# Patient Record
Sex: Male | Born: 1940 | Race: White | Hispanic: No | Marital: Married | Smoking: Former smoker
Health system: Southern US, Community
[De-identification: ages and names within clinical notes are randomized; demographics above are authoritative.]

## PROBLEM LIST (undated history)

## (undated) DIAGNOSIS — K4090 Unilateral inguinal hernia, without obstruction or gangrene, not specified as recurrent: Secondary | ICD-10-CM

## (undated) DIAGNOSIS — I10 Essential (primary) hypertension: Secondary | ICD-10-CM

## (undated) DIAGNOSIS — E039 Hypothyroidism, unspecified: Secondary | ICD-10-CM

## (undated) DIAGNOSIS — N529 Male erectile dysfunction, unspecified: Secondary | ICD-10-CM

## (undated) DIAGNOSIS — N400 Enlarged prostate without lower urinary tract symptoms: Secondary | ICD-10-CM

## (undated) DIAGNOSIS — E079 Disorder of thyroid, unspecified: Secondary | ICD-10-CM

## (undated) DIAGNOSIS — N189 Chronic kidney disease, unspecified: Secondary | ICD-10-CM

## (undated) HISTORY — PX: APPENDECTOMY: SHX54

## (undated) HISTORY — DX: Essential (primary) hypertension: I10

## (undated) HISTORY — DX: Unilateral inguinal hernia, without obstruction or gangrene, not specified as recurrent: K40.90

## (undated) HISTORY — DX: Male erectile dysfunction, unspecified: N52.9

## (undated) HISTORY — DX: Disorder of thyroid, unspecified: E07.9

## (undated) HISTORY — PX: HERNIA REPAIR: SHX51

## (undated) HISTORY — DX: Benign prostatic hyperplasia without lower urinary tract symptoms: N40.0

## (undated) HISTORY — DX: Chronic kidney disease, unspecified: N18.9

---

## 1998-12-19 ENCOUNTER — Ambulatory Visit (HOSPITAL_COMMUNITY): Admission: RE | Admit: 1998-12-19 | Discharge: 1998-12-19 | Payer: Self-pay | Admitting: Surgery

## 2000-11-09 ENCOUNTER — Encounter (INDEPENDENT_AMBULATORY_CARE_PROVIDER_SITE_OTHER): Payer: Self-pay | Admitting: *Deleted

## 2000-11-09 ENCOUNTER — Ambulatory Visit (HOSPITAL_COMMUNITY): Admission: RE | Admit: 2000-11-09 | Discharge: 2000-11-09 | Payer: Self-pay | Admitting: Gastroenterology

## 2004-01-15 ENCOUNTER — Ambulatory Visit (HOSPITAL_COMMUNITY): Admission: RE | Admit: 2004-01-15 | Discharge: 2004-01-15 | Payer: Self-pay | Admitting: Gastroenterology

## 2005-12-17 ENCOUNTER — Ambulatory Visit: Payer: Self-pay | Admitting: Cardiology

## 2006-05-27 ENCOUNTER — Emergency Department (HOSPITAL_COMMUNITY): Admission: EM | Admit: 2006-05-27 | Discharge: 2006-05-27 | Payer: Self-pay | Admitting: Emergency Medicine

## 2008-09-10 ENCOUNTER — Ambulatory Visit (HOSPITAL_COMMUNITY): Admission: RE | Admit: 2008-09-10 | Discharge: 2008-09-10 | Payer: Self-pay | Admitting: Family Medicine

## 2010-01-30 ENCOUNTER — Encounter: Payer: Self-pay | Admitting: Cardiology

## 2010-01-30 ENCOUNTER — Telehealth: Payer: Self-pay | Admitting: Cardiology

## 2010-02-03 DIAGNOSIS — I10 Essential (primary) hypertension: Secondary | ICD-10-CM | POA: Insufficient documentation

## 2010-02-09 ENCOUNTER — Encounter (INDEPENDENT_AMBULATORY_CARE_PROVIDER_SITE_OTHER): Payer: Self-pay | Admitting: *Deleted

## 2010-05-12 ENCOUNTER — Ambulatory Visit: Payer: Self-pay | Admitting: Cardiology

## 2010-05-12 ENCOUNTER — Ambulatory Visit: Payer: Self-pay

## 2010-11-24 NOTE — Progress Notes (Signed)
Summary: NEEDS GXT  Phone Note From Other Clinic   Caller: Provider Call For: pt Details for Reason: Per call from Dr Christell Constant stress test Summary of Call: recieved a phone call from Dr Rudi Heap, he is requesting a stress test.  States pt had one 2007 here.  Dr Christell Constant would like for Dr Antoine Poche to decide what kind of stress test the pt needs.  Pt has a strong family history of CAD, HTN and high cholestrol.  Have requested the paper chart and will forward to Dr Antoine Poche for review and orders.  cell phone number [phone]  JAKE LAST STRESS TEST WAS A POET.  PT HAS NO NEW S/S PER DR MOORE. HE JUST WANTS IT FOR SCREENING.  CHART ON YOUR CART Initial call taken by: Charolotte Capuchin, RN,  January 30, 2010 10:04 AM  Follow-up for Phone Call        OK to schedule POET (Plain Old Exercise Treadmill) Follow-up by: Rollene Rotunda, MD, [facility],  February 03, 2010 5:20 PM  New Problems: HYPERTENSION, CONTROLLED (ICD-401.1) CORONARY ARTERY DISEASE, FAMILY HX (ICD-V17.3)   New Problems: HYPERTENSION, CONTROLLED (ICD-401.1) CORONARY ARTERY DISEASE, FAMILY HX (ICD-V17.3)

## 2010-11-24 NOTE — Letter (Signed)
Summary: Generic Letter  Architectural technologist, Main Office  [address]   [place], [place] [address]   Phone: [phone]  Fax: [phone]        February 09, 2010 MRN: [id number]    [name] [address] [place], [place]  [address]    Dear Mr. Larmon,   Dear Mr. Wann,  Our records indicate it is time to schedule a treadmill test with Dr. Antoine Poche .  Please call our office to schedule your appointment.    If you have any questions regarding this, call the office at the number listed above, and ask for the nurse.          Sincerely,  Merita Norton Lloyd-Fate  This letter has been electronically signed by your physician.

## 2011-01-01 ENCOUNTER — Encounter: Payer: Self-pay | Admitting: Cardiology

## 2011-03-12 NOTE — Procedures (Signed)
[facility]  Patient:    [name], [name]                        MRN: [id number] Proc. Date: 11/09/00 Adm. Date:  [id number] Attending:  Nelda Marseille CC:         Monica Becton, M.D.                           Procedure Report  PROCEDURE:  Colonoscopy with hot biopsy.  INDICATIONS:  Patient due for colonic screening with a mild change in bowel habits, some increased diarrhea, and one grandfather with colon cancer.  INFORMED CONSENT:  Consent was signed after risk, benefits, methods and options were thoroughly discussed in the office.  MEDICINES USED:  Demerol 60 mg, Versed 6 mg.  DESCRIPTION OF PROCEDURE:  Rectal inspection was pertinent for external hemorrhoids.  Digital exam was negative.  The video colonoscope was inserted and very easily advanced around the colon to the cecum.  No obvious abnormality, but a small rectal polyp was seen on insertion.  The cecum was identified by the appendiceal orifice and the ileocecal valve.  In fact, the scope was inserted a short ways in the terminal ileum which was normal. Photodocumentation was obtained.  The scope was slowly withdrawn.  Prep was good.  There was minimal liquid stool that required suctioning only.  On slow withdrawal through the colon, the cecum and the ascending was normal.  At the approximate level of the hepatic flexure, a 2-3 mm polyp was seen and was hot biopsied x 1.  The scope was further withdrawn.  The transverse was normal. In the proximal level of the splenic flexure, however, another 3-4 mm polyp was seen and was hot biopsied x 2.  The scope was further withdrawn back to the rectum.  No additional polyps were seen in the descending or sigmoid.  In the rectum, the polyp seen on insertion was snared, electrocautery applied, and the polyp was suctioned through the scope and collected in the trap.  It was about 4 mm in size.  There seemed to be possibly some residual polyp  which was hot biopsied x 1.  The scope was retroflexed pertinent for some internal hemorrhoids.  The scope was straightened, air was withdrawn, and the scope removed.  The patient tolerated the procedure well, and there was no obvious immediate complication.  ENDOSCOPIC DIAGNOSES: 1. Internal and external hemorrhoids. 2. Three small polyps, one in the rectum, snared and hot biopsied; two in the    splenic and hepatic flexure, both hot biopsied. 3. Otherwise within normal limits to the terminal ileum.  PLAN:  GI follow-up p.r.n.  Yearly rectals and guaiacs per Dr. Christell Constant.  One week customary postpolypectomy instructions and await pathology to determine future colonic screening. DD:  11/09/00 TD:  11/10/00 Job: [id number]

## 2011-03-12 NOTE — Op Note (Signed)
[name], [name]                           ACCOUNT NO.:  [id number]   MEDICAL RECORD NO.:  [id number]                   PATIENT TYPE:  AMB   LOCATION:  ENDO                                 FACILITY:  [facility]   PHYSICIAN:  Petra Kuba, M.D.                 DATE OF BIRTH:  23-Nov-1940   DATE OF PROCEDURE:  01/15/2004  DATE OF DISCHARGE:                                 OPERATIVE REPORT   PROCEDURE:  Colonoscopy.   INDICATIONS FOR PROCEDURE:  A patient with a history of colon polyps, family  history of colon cancer.   Consent was signed after risks, benefits, methods, and options were  thoroughly discussed in the office.   MEDICINES USED:  Demerol 90, Versed 9.   DESCRIPTION OF PROCEDURE:  Rectal inspection was pertinent for small  external hemorrhoids. Digital exam was negative. The video colonoscope was  inserted, easily advanced around the colon to the cecum. This did not  require any abdominal pressure or any position changes. No abnormality was  seen on insertion. The cecum was identified by the appendiceal orifice and  the ileocecal valve. The scope was slowly withdrawn.  No abnormalities were  seen as the scope was slowly withdrawn back to the rectum. Specifically no  polyps, tumors, masses, diverticula or other abnormalities.  The prep was  adequate. There was some liquid stool that required washing and suctioning.  Anal rectal pullthrough and retroflexion confirmed small hemorrhoids.  The  scope was reinserted a short ways up the left side of the colon, air was  suctioned, scope removed.  The patient tolerated the procedure well.  There  was no obvious or immediate complications.   ENDOSCOPIC DIAGNOSIS:  1. Internal and external hemorrhoids.  2. Otherwise within normal limits to the cecum.   PLAN:  Yearly rectals and guaiacs per Dr. Christell Constant.  Happy to see back p.r.n.  otherwise repeat colonoscopy in five years.                                               Petra Kuba, M.D.    MEM/MEDQ  D:  01/15/2004  T:  01/16/2004  Job:  [id number]   cc:   Ernestina Penna, M.D.  [address] [place]  [place] [address]  Fax: [phone]

## 2012-10-02 ENCOUNTER — Encounter: Payer: Self-pay | Admitting: Cardiology

## 2012-10-03 LAB — HEPATIC FUNCTION PANEL
ALT: 26 U/L (ref 10–40)
AST: 24 U/L (ref 14–40)
Alkaline Phosphatase: 61 U/L (ref 25–125)
Bilirubin, Direct: 0.1 mg/dL (ref 0.01–0.4)
Bilirubin, Total: 0.6 mg/dL

## 2012-10-03 LAB — BASIC METABOLIC PANEL WITH GFR
BUN: 14 mg/dL (ref 4–21)
Creatinine: 1.3 mg/dL (ref 0.6–1.3)
Glucose: 102 mg/dL
Potassium: 4 mmol/L (ref 3.4–5.3)
Sodium: 141 mmol/L (ref 137–147)

## 2012-10-03 LAB — LIPID PANEL: HDL: 43 mg/dL (ref 35–70)

## 2012-10-03 LAB — CBC AND DIFFERENTIAL
HCT: 46 % (ref 41–53)
Hemoglobin: 15 g/dL (ref 13.5–17.5)
Platelets: 267 K/µL (ref 150–399)
WBC: 6.7 10*3/mL

## 2013-01-31 ENCOUNTER — Encounter: Payer: Self-pay | Admitting: Family Medicine

## 2013-01-31 ENCOUNTER — Ambulatory Visit (INDEPENDENT_AMBULATORY_CARE_PROVIDER_SITE_OTHER): Payer: Medicare Other | Admitting: Family Medicine

## 2013-01-31 ENCOUNTER — Encounter: Payer: Self-pay | Admitting: *Deleted

## 2013-01-31 ENCOUNTER — Ambulatory Visit (INDEPENDENT_AMBULATORY_CARE_PROVIDER_SITE_OTHER): Payer: Medicare Other

## 2013-01-31 VITALS — BP 147/84 | HR 66 | Temp 97.4°F | Ht 71.25 in | Wt 261.8 lb

## 2013-01-31 DIAGNOSIS — N183 Chronic kidney disease, stage 3 unspecified: Secondary | ICD-10-CM | POA: Insufficient documentation

## 2013-01-31 DIAGNOSIS — E785 Hyperlipidemia, unspecified: Secondary | ICD-10-CM | POA: Insufficient documentation

## 2013-01-31 DIAGNOSIS — N400 Enlarged prostate without lower urinary tract symptoms: Secondary | ICD-10-CM | POA: Insufficient documentation

## 2013-01-31 DIAGNOSIS — E559 Vitamin D deficiency, unspecified: Secondary | ICD-10-CM

## 2013-01-31 DIAGNOSIS — N189 Chronic kidney disease, unspecified: Secondary | ICD-10-CM

## 2013-01-31 DIAGNOSIS — I10 Essential (primary) hypertension: Secondary | ICD-10-CM

## 2013-01-31 DIAGNOSIS — E039 Hypothyroidism, unspecified: Secondary | ICD-10-CM | POA: Insufficient documentation

## 2013-01-31 LAB — BASIC METABOLIC PANEL WITH GFR
CO2: 28 mEq/L (ref 19–32)
GFR, Est African American: 60 mL/min
GFR, Est Non African American: 52 mL/min — ABNORMAL LOW
Glucose, Bld: 108 mg/dL — ABNORMAL HIGH (ref 70–99)
Potassium: 3.8 mEq/L (ref 3.5–5.3)
Sodium: 140 mEq/L (ref 135–145)

## 2013-01-31 LAB — HEPATIC FUNCTION PANEL
ALT: 34 U/L (ref 0–53)
AST: 35 U/L (ref 0–37)
Alkaline Phosphatase: 61 U/L (ref 39–117)
Bilirubin, Direct: 0.2 mg/dL (ref 0.0–0.3)

## 2013-01-31 LAB — POCT CBC
Granulocyte percent: 52.4 %G (ref 37–80)
HCT, POC: 44.1 % (ref 43.5–53.7)
MCH, POC: 30.5 pg (ref 27–31.2)
MCHC: 34.3 g/dL (ref 31.8–35.4)
MCV: 88.7 fL (ref 80–97)
MPV: 6.6 fL (ref 0–99.8)
POC LYMPH PERCENT: 38.7 %L (ref 10–50)
Platelet Count, POC: 280 10*3/uL (ref 142–424)

## 2013-01-31 NOTE — Progress Notes (Addendum)
  Subjective:    [name], male    DOB: Jun 27, 1941, 72 y.o.   MRN: [id number]  HPI This patient presents for recheck of multiple medical problems. No one accompanies the patient today.  Patient Active Problem List  Diagnosis  . HYPERTENSION, CONTROLLED  . Hyperlipidemia  . Hypothyroid  . BPH (benign prostatic hyperplasia)  . CKD (chronic kidney disease)      The allergies, current medications, past medical history, surgical history, family and social history are reviewed.  Immunizations reviewed.  Health maintenance reviewed.  The following items are outstanding: none  Home bps 130's/upper 70's    Review of Systems  Constitutional: Negative.   HENT: Positive for postnasal drip (due to allergies).   Eyes: Negative.   Respiratory: Negative.   Cardiovascular: Negative.   Gastrointestinal: Positive for constipation (occasional).  Genitourinary: Positive for frequency (3-4 times qhs, enlarged prostate).  Musculoskeletal: Negative.   Allergic/Immunologic: Positive for environmental allergies.  Neurological: Negative.   Psychiatric/Behavioral: Positive for sleep disturbance (nightly, due to urinary frequency). The patient is nervous/anxious (family stressors).        Objective:   Physical Exam  BP 147/84  Pulse 66  Temp(Src) 97.4 F (36.3 C) (Oral)  Ht 5' 11.25" (1.81 m)  Wt 261 lb 12.8 oz (118.752 kg)  BMI 36.25 kg/m2  The patient appeared well nourished and normally developed, alert and oriented to time and place. Speech, behavior and judgement appear normal. Vital signs as documented.  Head exam is unremarkable. No scleral icterus or pallor noted.  Neck is without jugular venous distension, thyromegally, or carotid bruits. Carotid upstrokes are brisk bilaterally. No cervical adenopathy. Lungs are clear anteriorly and posteriorly to auscultation. Normal respiratory effort. Cardiac exam reveals regular rate and rhythm. First and second heart sounds  normal. No murmurs, rubs or gallops.  Abdominal exam reveals normal bowl sounds, no masses, no organomegaly and no aortic enlargement. No inguinal adenopathy. Extremities are nonedematous and both femoral and pedal pulses are normal. Skin without pallor or jaundice.  Warm and dry, without rash. Neurologic exam reveals normal deep tendon reflexes and normal sensation.  [facility] reading (PRIMARY) by  Dr. Vernon Prey: Chest x-ray appears to be normal.                                           Assessment & Plan:   1. Essential hypertension, benign - BASIC METABOLIC PANEL WITH GFR; Standing  2. Other and unspecified hyperlipidemia - Hepatic function panel; Standing - NMR Lipoprofile with Lipids; Standing  3. Unspecified hypothyroidism  4. Unspecified vitamin D deficiency - POCT CBC; Standing - Vitamin D 25 hydroxy; Standing  5. CKD (chronic kidney disease) - POCT CBC; Standing  6. BPH (benign prostatic hypertrophy) - POCT CBC; Standing

## 2013-01-31 NOTE — Patient Instructions (Addendum)
FOBT given Continue current meds and therapeutic lifestyle changes

## 2013-01-31 NOTE — Addendum Note (Signed)
Addended by: Prescott Gum on: 01/31/2013 10:46 AM   Modules accepted: Orders

## 2013-02-01 LAB — NMR LIPOPROFILE WITH LIPIDS
Cholesterol, Total: 98 mg/dL (ref <200)
HDL Size: 9 nm — ABNORMAL LOW (ref >=9.2)
LDL (calc): 44 mg/dL (ref <100)
LDL Particle Number: 631 nmol/L (ref <1000)
LDL Size: 20.1 nm — ABNORMAL LOW (ref >20.5)
LP-IR Score: 50 — ABNORMAL HIGH (ref <=45)
Large HDL-P: 2.6 umol/L — ABNORMAL LOW (ref >=4.8)
Small LDL Particle Number: 356 nmol/L (ref <=527)
Triglycerides: 83 mg/dL (ref <150)

## 2013-03-05 ENCOUNTER — Other Ambulatory Visit (INDEPENDENT_AMBULATORY_CARE_PROVIDER_SITE_OTHER): Payer: Medicare Other

## 2013-03-05 DIAGNOSIS — Z1212 Encounter for screening for malignant neoplasm of rectum: Secondary | ICD-10-CM

## 2013-03-05 NOTE — Progress Notes (Signed)
Patient dropped off fobt 

## 2013-03-06 LAB — FECAL OCCULT BLOOD, IMMUNOCHEMICAL: Fecal Occult Blood: NEGATIVE

## 2013-03-09 ENCOUNTER — Telehealth: Payer: Self-pay | Admitting: Family Medicine

## 2013-03-10 NOTE — Telephone Encounter (Signed)
LMOM with normal result

## 2013-05-11 ENCOUNTER — Telehealth: Payer: Self-pay | Admitting: Family Medicine

## 2013-05-21 ENCOUNTER — Encounter: Payer: Self-pay | Admitting: Family Medicine

## 2013-08-06 ENCOUNTER — Encounter: Payer: Self-pay | Admitting: Family Medicine

## 2013-08-06 ENCOUNTER — Ambulatory Visit (INDEPENDENT_AMBULATORY_CARE_PROVIDER_SITE_OTHER): Payer: Medicare Other | Admitting: Family Medicine

## 2013-08-06 VITALS — BP 141/85 | HR 65 | Temp 97.3°F | Ht 71.25 in | Wt 265.0 lb

## 2013-08-06 DIAGNOSIS — L918 Other hypertrophic disorders of the skin: Secondary | ICD-10-CM

## 2013-08-06 DIAGNOSIS — N400 Enlarged prostate without lower urinary tract symptoms: Secondary | ICD-10-CM

## 2013-08-06 DIAGNOSIS — E039 Hypothyroidism, unspecified: Secondary | ICD-10-CM

## 2013-08-06 DIAGNOSIS — K098 Other cysts of oral region, not elsewhere classified: Secondary | ICD-10-CM

## 2013-08-06 DIAGNOSIS — K099 Cyst of oral region, unspecified: Secondary | ICD-10-CM

## 2013-08-06 DIAGNOSIS — L909 Atrophic disorder of skin, unspecified: Secondary | ICD-10-CM

## 2013-08-06 DIAGNOSIS — E559 Vitamin D deficiency, unspecified: Secondary | ICD-10-CM

## 2013-08-06 DIAGNOSIS — E785 Hyperlipidemia, unspecified: Secondary | ICD-10-CM

## 2013-08-06 DIAGNOSIS — Z23 Encounter for immunization: Secondary | ICD-10-CM

## 2013-08-06 DIAGNOSIS — I10 Essential (primary) hypertension: Secondary | ICD-10-CM

## 2013-08-06 LAB — POCT CBC
Granulocyte percent: 67.9 %G (ref 37–80)
Hemoglobin: 15 g/dL (ref 14.1–18.1)
MCH, POC: 29.9 pg (ref 27–31.2)
MCV: 88.6 fL (ref 80–97)
MPV: 6.6 fL (ref 0–99.8)
Platelet Count, POC: 297 10*3/uL (ref 142–424)
RBC: 5 M/uL (ref 4.69–6.13)

## 2013-08-06 MED ORDER — AMLODIPINE BESYLATE 10 MG PO TABS: 10.0000 mg | ORAL_TABLET | Freq: Every day | ORAL | Status: DC

## 2013-08-06 MED ORDER — LOVASTATIN 40 MG PO TABS: 40.0000 mg | ORAL_TABLET | Freq: Every day | ORAL | Status: DC

## 2013-08-06 MED ORDER — LEVOTHYROXINE SODIUM 137 MCG PO TABS: 137.0000 ug | ORAL_TABLET | Freq: Every day | ORAL | Status: DC

## 2013-08-06 MED ORDER — LOSARTAN POTASSIUM 100 MG PO TABS: 100.0000 mg | ORAL_TABLET | Freq: Every day | ORAL | Status: DC

## 2013-08-06 NOTE — Addendum Note (Signed)
Addended by: Ardine Eng A on: 08/06/2013 09:55 AM   Modules accepted: Orders

## 2013-08-06 NOTE — Progress Notes (Signed)
Subjective:    [name], male    DOB: 08/31/1941, 72 y.o.   MRN: [id number]  HPI Pt here for follow up and management of chronic medical problems. See review of systems.On health maintenance parameters, patient will need a Prevnar today and flu shot.     Patient Active Problem List   Diagnosis Date Noted  . Hyperlipidemia 01/31/2013  . Hypothyroid 01/31/2013  . BPH (benign prostatic hyperplasia) 01/31/2013  . CKD (chronic kidney disease) 01/31/2013  . HYPERTENSION, CONTROLLED 02/03/2010   Outpatient Encounter Prescriptions as of 08/06/2013  Medication Sig Dispense Refill  . amLODipine (NORVASC) 10 MG tablet Take 10 mg by mouth daily.      Marland Kitchen aspirin 81 MG chewable tablet Chew 81 mg by mouth daily.      . Cholecalciferol (HM VITAMIN D3) 4000 UNITS CAPS Take 2,000 Units by mouth daily.       Marland Kitchen levothyroxine (SYNTHROID, LEVOTHROID) 137 MCG tablet Take 137 mcg by mouth daily before breakfast.      . losartan (COZAAR) 100 MG tablet Take 100 mg by mouth daily.      Marland Kitchen lovastatin (MEVACOR) 40 MG tablet Take 40 mg by mouth at bedtime.      . Omega-3 Fatty Acids (FISH OIL) 1200 MG CAPS Take 1,200 mg by mouth daily.       No facility-administered encounter medications on file as of 08/06/2013.    Review of Systems  Constitutional: Negative.   HENT: Positive for dental problem (dentist states- wart inside mouth).   Eyes: Negative.   Respiratory: Negative.   Cardiovascular: Negative.   Gastrointestinal: Negative.   Endocrine: Negative.   Genitourinary: Negative.   Musculoskeletal: Negative.   Skin: Negative.        Skin tags  Allergic/Immunologic: Negative.   Neurological: Negative.   Hematological: Negative.   Psychiatric/Behavioral: Negative.        Objective:   Physical Exam  Nursing note and vitals reviewed. Constitutional: He is oriented to person, place, and time. He appears well-developed and well-nourished.  Pleasant and alert  HENT:  Head:  Normocephalic and atraumatic.  Right Ear: External ear normal.  Left Ear: External ear normal.  Nose: Nose normal.  Mouth/Throat: Oropharynx is clear and moist. No oropharyngeal exudate.  There was a tiny nodular area that appeared to be like a mucous cyst noted on a pinhead in his right buccal mucosa  Eyes: Conjunctivae and EOM are normal. Right eye exhibits no discharge. Left eye exhibits no discharge. No scleral icterus.  Neck: Normal range of motion. Neck supple. No tracheal deviation present. No thyromegaly present.  There were no bruits in the neck  Cardiovascular: Normal rate, regular rhythm, normal heart sounds and intact distal pulses.  Exam reveals no gallop and no friction rub.   No murmur heard. At 72 per minute  Pulmonary/Chest: Effort normal and breath sounds normal. No respiratory distress. He has no wheezes. He has no rales. He exhibits no tenderness.  Abdominal: Soft. Bowel sounds are normal. He exhibits no mass. There is no tenderness. There is no rebound and no guarding.  Musculoskeletal: Normal range of motion. He exhibits no edema and no tenderness.  Lymphadenopathy:    He has no cervical adenopathy.  Neurological: He is alert and oriented to person, place, and time. He has normal reflexes. No cranial nerve deficit.  Skin: Skin is warm and dry. No rash noted. No erythema. No pallor.  Skin tags of chest near axilla, non-irritated  Psychiatric:  He has a normal mood and affect. His behavior is normal. Judgment and thought content normal.   BP 141/85  Pulse 65  Temp(Src) 97.3 F (36.3 C) (Oral)  Ht 5' 11.25" (1.81 m)  Wt 265 lb (120.203 kg)  BMI 36.69 kg/m2        Assessment & Plan:   1. HYPERTENSION, CONTROLLED   2. Hyperlipidemia   3. Hypothyroid   4. BPH (benign prostatic hyperplasia)   5. Vitamin D deficiency   6. Mucous cyst of mouth   7. Cutaneous skin tags    Orders Placed This Encounter  Procedures  . Hepatic function panel  . BMP8+EGFR  . NMR,  lipoprofile  . Vit D  25 hydroxy (rtn osteoporosis monitoring)  . Thyroid Panel With TSH  . POCT CBC   All meds will be refilled for mail order today  Patient Instructions  Continue current medications. Continue good therapeutic lifestyle changes.  Fall precautions discussed with patient. Follow up as planned and earlier as needed.  You receive your flu shot and a one time Prevnar vaccination today Monitor  mucous cyst in mouth and if it gets larger or more irritated arrange a visit with an oral surgeon    Nyra Capes MD

## 2013-08-06 NOTE — Progress Notes (Signed)
Flu shot and Prevnar given . Pt tolerated well

## 2013-08-06 NOTE — Addendum Note (Signed)
Addended by: Magdalene River on: 08/06/2013 09:52 AM   Modules accepted: Orders

## 2013-08-06 NOTE — Patient Instructions (Addendum)
Continue current medications. Continue good therapeutic lifestyle changes.  Fall precautions discussed with patient. Follow up as planned and earlier as needed.  You receive your flu shot and a one time Prevnar vaccination today Monitor  mucous cyst in mouth and if it gets larger or more irritated arrange a visit with an oral surgeon Pneumococcal Vaccine, Polyvalent suspension for injection What is this medicine? PNEUMOCOCCAL VACCINE, POLYVALENT (NEU mo KOK al vak SEEN, pol ee VEY luhnt) is a vaccine to prevent pneumococcus bacteria infection. These bacteria are a major cause of ear infections, 'Strep throat' infections, and serious pneumonia, meningitis, or blood infections worldwide. These vaccines help the body to produce antibodies (protective substances) that help your body defend against these bacteria. This vaccine is recommended for infants and young children. This vaccine will not treat an infection. This medicine may be used for other purposes; ask your health care provider or pharmacist if you have questions. What should I tell my health care provider before I take this medicine? They need to know if you have any of these conditions: -bleeding problems -fever -immune system problems -low platelet count in the blood -seizures -an unusual or allergic reaction to pneumococcal vaccine, diphtheria toxoid, other vaccines, latex, other medicines, foods, dyes, or preservatives -pregnant or trying to get pregnant -breast-feeding How should I use this medicine? This vaccine is for injection into a muscle. It is given by a health care professional. A copy of Vaccine Information Statements will be given before each vaccination. Read this sheet carefully each time. The sheet may change frequently. Talk to your pediatrician regarding the use of this medicine in children. While this drug may be prescribed for children as young as 42 weeks old for selected conditions, precautions do  apply. Overdosage: If you think you have taken too much of this medicine contact a poison control center or emergency room at once. NOTE: This medicine is only for you. Do not share this medicine with others. What if I miss a dose? It is important not to miss your dose. Call your doctor or health care professional if you are unable to keep an appointment. What may interact with this medicine? -medicines for cancer chemotherapy -medicines that suppress your immune function -medicines that treat or prevent blood clots like warfarin, enoxaparin, and dalteparin -steroid medicines like prednisone or cortisone This list may not describe all possible interactions. Give your health care provider a list of all the medicines, herbs, non-prescription drugs, or dietary supplements you use. Also tell them if you smoke, drink alcohol, or use illegal drugs. Some items may interact with your medicine. What should I watch for while using this medicine? Mild fever and pain should go away in 3 days or less. Report any unusual symptoms to your doctor or health care professional. What side effects may I notice from receiving this medicine? Side effects that you should report to your doctor or health care professional as soon as possible: -allergic reactions like skin rash, itching or hives, swelling of the face, lips, or tongue -breathing problems -confused -fever over 102 degrees F -pain, tingling, numbness in the hands or feet -seizures -unusual bleeding or bruising -unusual muscle weakness Side effects that usually do not require medical attention (report to your doctor or health care professional if they continue or are bothersome): -aches and pains -diarrhea -fever of 102 degrees F or less -headache -irritable -loss of appetite -pain, tender at site where injected -trouble sleeping This list may not describe all possible side effects.  Call your doctor for medical advice about side effects. You may  report side effects to FDA at 1-800-FDA-1088. Where should I keep my medicine? This does not apply. This vaccine is given in a clinic, pharmacy, doctor's office, or other health care setting and will not be stored at home. NOTE: This sheet is a summary. It may not cover all possible information. If you have questions about this medicine, talk to your doctor, pharmacist, or health care provider.  2013, Elsevier/Gold Standard. (12/24/2008 10:17:22 AM) Influenza Virus Vaccine injection (Fluarix) What is this medicine? INFLUENZA VIRUS VACCINE (in floo EN zuh VAHY ruhs vak SEEN) helps to reduce the risk of getting influenza also known as the flu. This medicine may be used for other purposes; ask your health care provider or pharmacist if you have questions. What should I tell my health care provider before I take this medicine? They need to know if you have any of these conditions: -bleeding disorder like hemophilia -fever or infection -Guillain-Barre syndrome or other neurological problems -immune system problems -infection with the human immunodeficiency virus (HIV) or AIDS -low blood platelet counts -multiple sclerosis -an unusual or allergic reaction to influenza virus vaccine, eggs, chicken proteins, latex, gentamicin, other medicines, foods, dyes or preservatives -pregnant or trying to get pregnant -breast-feeding How should I use this medicine? This vaccine is for injection into a muscle. It is given by a health care professional. A copy of Vaccine Information Statements will be given before each vaccination. Read this sheet carefully each time. The sheet may change frequently. Talk to your pediatrician regarding the use of this medicine in children. Special care may be needed. Overdosage: If you think you have taken too much of this medicine contact a poison control center or emergency room at once. NOTE: This medicine is only for you. Do not share this medicine with others. What if I  miss a dose? This does not apply. What may interact with this medicine? -chemotherapy or radiation therapy -medicines that lower your immune system like etanercept, anakinra, infliximab, and adalimumab -medicines that treat or prevent blood clots like warfarin -phenytoin -steroid medicines like prednisone or cortisone -theophylline -vaccines This list may not describe all possible interactions. Give your health care provider a list of all the medicines, herbs, non-prescription drugs, or dietary supplements you use. Also tell them if you smoke, drink alcohol, or use illegal drugs. Some items may interact with your medicine. What should I watch for while using this medicine? Report any side effects that do not go away within 3 days to your doctor or health care professional. Call your health care provider if any unusual symptoms occur within 6 weeks of receiving this vaccine. You may still catch the flu, but the illness is not usually as bad. You cannot get the flu from the vaccine. The vaccine will not protect against colds or other illnesses that may cause fever. The vaccine is needed every year. What side effects may I notice from receiving this medicine? Side effects that you should report to your doctor or health care professional as soon as possible: -allergic reactions like skin rash, itching or hives, swelling of the face, lips, or tongue Side effects that usually do not require medical attention (report to your doctor or health care professional if they continue or are bothersome): -fever -headache -muscle aches and pains -pain, tenderness, redness, or swelling at site where injected -weak or tired This list may not describe all possible side effects. Call your doctor for medical  advice about side effects. You may report side effects to FDA at 1-800-FDA-1088. Where should I keep my medicine? This vaccine is only given in a clinic, pharmacy, doctor's office, or other health care  setting and will not be stored at home. NOTE: This sheet is a summary. It may not cover all possible information. If you have questions about this medicine, talk to your doctor, pharmacist, or health care provider.  2013, Elsevier/Gold Standard. (05/08/2008 9:30:40 AM)

## 2013-08-08 ENCOUNTER — Other Ambulatory Visit: Payer: Self-pay | Admitting: *Deleted

## 2013-08-08 LAB — THYROID PANEL WITH TSH
T3 Uptake Ratio: 29 % (ref 24–39)
T4, Total: 9.1 ug/dL (ref 4.5–12.0)
TSH: 6.22 u[IU]/mL — ABNORMAL HIGH (ref 0.450–4.500)

## 2013-08-08 LAB — HEPATIC FUNCTION PANEL
ALT: 34 IU/L (ref 0–44)
AST: 27 IU/L (ref 0–40)
Alkaline Phosphatase: 77 IU/L (ref 39–117)
Total Bilirubin: 0.4 mg/dL (ref 0.0–1.2)

## 2013-08-08 LAB — NMR, LIPOPROFILE
HDL Particle Number: 28.1 umol/L — ABNORMAL LOW (ref >=30.5)
LDL Particle Number: 756 nmol/L (ref <1000)
LDLC SERPL CALC-MCNC: 47 mg/dL (ref <100)
LP-IR Score: 60 — ABNORMAL HIGH (ref <=45)
Triglycerides by NMR: 111 mg/dL (ref <150)

## 2013-08-08 LAB — BMP8+EGFR
BUN/Creatinine Ratio: 14 (ref 10–22)
Creatinine, Ser: 1.37 mg/dL — ABNORMAL HIGH (ref 0.76–1.27)

## 2013-08-08 MED ORDER — LEVOTHYROXINE SODIUM 25 MCG PO TABS: 25.0000 ug | ORAL_TABLET | Freq: Every day | ORAL | Status: DC

## 2014-02-04 ENCOUNTER — Encounter: Payer: Self-pay | Admitting: Family Medicine

## 2014-02-04 ENCOUNTER — Ambulatory Visit (INDEPENDENT_AMBULATORY_CARE_PROVIDER_SITE_OTHER): Payer: Medicare Other | Admitting: Family Medicine

## 2014-02-04 VITALS — BP 141/82 | HR 64 | Temp 96.5°F | Ht 71.25 in | Wt 267.0 lb

## 2014-02-04 DIAGNOSIS — N400 Enlarged prostate without lower urinary tract symptoms: Secondary | ICD-10-CM

## 2014-02-04 DIAGNOSIS — E785 Hyperlipidemia, unspecified: Secondary | ICD-10-CM

## 2014-02-04 DIAGNOSIS — E039 Hypothyroidism, unspecified: Secondary | ICD-10-CM

## 2014-02-04 DIAGNOSIS — E8881 Metabolic syndrome: Secondary | ICD-10-CM | POA: Insufficient documentation

## 2014-02-04 DIAGNOSIS — N189 Chronic kidney disease, unspecified: Secondary | ICD-10-CM

## 2014-02-04 DIAGNOSIS — E559 Vitamin D deficiency, unspecified: Secondary | ICD-10-CM

## 2014-02-04 DIAGNOSIS — I10 Essential (primary) hypertension: Secondary | ICD-10-CM

## 2014-02-04 LAB — POCT CBC
GRANULOCYTE PERCENT: 61.9 % (ref 37–80)
HCT, POC: 44.1 % (ref 43.5–53.7)
HEMOGLOBIN: 14.7 g/dL (ref 14.1–18.1)
Lymph, poc: 2.3 (ref 0.6–3.4)
MCH, POC: 29.7 pg (ref 27–31.2)
MCHC: 33.3 g/dL (ref 31.8–35.4)
MCV: 89.2 fL (ref 80–97)
MPV: 7.4 fL (ref 0–99.8)
POC Granulocyte: 4.2 (ref 2–6.9)
POC LYMPH %: 33.6 % (ref 10–50)
Platelet Count, POC: 254 10*3/uL (ref 142–424)
RBC: 4.9 M/uL (ref 4.69–6.13)
RDW, POC: 13.2 %
WBC: 6.8 10*3/uL (ref 4.6–10.2)

## 2014-02-04 LAB — POCT URINALYSIS DIPSTICK
Bilirubin, UA: NEGATIVE
GLUCOSE UA: NEGATIVE
Ketones, UA: NEGATIVE
LEUKOCYTES UA: NEGATIVE
Nitrite, UA: NEGATIVE
Protein, UA: NEGATIVE
Spec Grav, UA: 1.015
UROBILINOGEN UA: NEGATIVE
pH, UA: 6

## 2014-02-04 LAB — POCT GLYCOSYLATED HEMOGLOBIN (HGB A1C): Hemoglobin A1C: 5.7

## 2014-02-04 LAB — POCT UA - MICROSCOPIC ONLY
Bacteria, U Microscopic: NEGATIVE
Casts, Ur, LPF, POC: NEGATIVE
Crystals, Ur, HPF, POC: NEGATIVE
Mucus, UA: NEGATIVE
WBC, UR, HPF, POC: NEGATIVE
YEAST UA: NEGATIVE

## 2014-02-04 NOTE — Progress Notes (Signed)
Subjective:    [name], male    DOB: 03-Dec-1940, 73 y.o.   MRN: [id number]  HPI Pt here for follow up and management of chronic medical problems. Has been doing well with no particular complaints or problems. He does have a history of hypertension, hyperlipidemia and hypothyroidism. To get lab work done today. He will be given an FOBT to return. He is also to do to get a colonoscopy and he has reconnected with a gastroenterologist and this will be done soon. He is up-to-date on his immunizations.         Patient Active Problem List   Diagnosis Date Noted  . Hyperlipidemia 01/31/2013  . Hypothyroid 01/31/2013  . BPH (benign prostatic hyperplasia) 01/31/2013  . CKD (chronic kidney disease) 01/31/2013  . HYPERTENSION, CONTROLLED 02/03/2010   Outpatient Encounter Prescriptions as of 02/04/2014  Medication Sig  . amLODipine (NORVASC) 10 MG tablet Take 1 tablet (10 mg total) by mouth daily.  Marland Kitchen aspirin 81 MG chewable tablet Chew 81 mg by mouth daily.  . Cholecalciferol (HM VITAMIN D3) 4000 UNITS CAPS Take 2,000 Units by mouth daily.   Marland Kitchen levothyroxine (LEVOTHROID) 25 MCG tablet Take 1 tablet (25 mcg total) by mouth daily before breakfast. One daily as directed  . levothyroxine (SYNTHROID, LEVOTHROID) 137 MCG tablet Take 1 tablet (137 mcg total) by mouth daily before breakfast.  . losartan (COZAAR) 100 MG tablet Take 1 tablet (100 mg total) by mouth daily.  Marland Kitchen lovastatin (MEVACOR) 40 MG tablet Take 1 tablet (40 mg total) by mouth at bedtime.  . Omega-3 Fatty Acids (FISH OIL) 1200 MG CAPS Take 1,200 mg by mouth daily.    Review of Systems  Constitutional: Negative.   HENT: Negative.   Eyes: Negative.   Respiratory: Negative.   Cardiovascular: Negative.   Gastrointestinal: Negative.   Endocrine: Negative.   Genitourinary: Negative.   Musculoskeletal: Negative.   Skin: Negative.   Allergic/Immunologic: Negative.   Neurological: Negative.   Hematological: Negative.    Psychiatric/Behavioral: Negative.        Objective:   Physical Exam  Nursing note and vitals reviewed. Constitutional: He is oriented to person, place, and time. He appears well-developed and well-nourished. No distress.  HENT:  Head: Normocephalic and atraumatic.  Right Ear: External ear normal.  Left Ear: External ear normal.  Nose: Nose normal.  Mouth/Throat: Oropharynx is clear and moist. No oropharyngeal exudate.  Minimal nasal congestion  Eyes: Conjunctivae and EOM are normal. Pupils are equal, round, and reactive to light. Right eye exhibits no discharge. Left eye exhibits no discharge. No scleral icterus.  Neck: Normal range of motion. Neck supple. No thyromegaly present.  No carotid bruits  Cardiovascular: Normal rate, regular rhythm, normal heart sounds and intact distal pulses.  Exam reveals no gallop and no friction rub.   No murmur heard. At 72 per minute  Pulmonary/Chest: Effort normal and breath sounds normal. No respiratory distress. He has no wheezes. He has no rales.  No axillary adenopathy  Abdominal: Soft. Bowel sounds are normal. He exhibits no mass. There is no tenderness. There is no rebound and no guarding.  No inguinal adenopathy  Genitourinary: Rectum normal and penis normal.  B. prostate gland was enlarged but soft and smooth the right side was larger than the left. There were no rectal masses. There is no inguinal hernias. The external genitalia were normal.  Musculoskeletal: Normal range of motion. He exhibits no edema and no tenderness.  Lymphadenopathy:  He has no cervical adenopathy.  Neurological: He is alert and oriented to person, place, and time. He has normal reflexes. No cranial nerve deficit.  Skin: Skin is warm and dry. No rash noted. No erythema. No pallor.  Dry skin lower extremitys  Psychiatric: He has a normal mood and affect. His behavior is normal. Judgment and thought content normal.   BP 141/82  Pulse 64  Temp(Src) 96.5 F (35.8  C) (Oral)  Ht 5' 11.25" (1.81 m)  Wt 267 lb (121.11 kg)  BMI 36.97 kg/m2        Assessment & Plan:  1. Hyperlipidemia - POCT CBC - NMR, lipoprofile  2. HYPERTENSION, CONTROLLED - POCT CBC - BMP8+EGFR - Hepatic function panel  3. Hypothyroid - POCT CBC  4. BPH (benign prostatic hyperplasia) - POCT CBC - PSA, total and free - POCT urinalysis dipstick - POCT UA - Microscopic Only  5. CKD (chronic kidney disease) - POCT CBC  6. Metabolic syndrome - POCT CBC - POCT glycosylated hemoglobin (Hb A1C)  7. Vitamin D deficiency - Vit D  25 hydroxy (rtn osteoporosis monitoring)  Patient Instructions                       Medicare Annual Wellness Visit  [place] and the medical providers at [facility] strive to bring you the best medical care.  In doing so we not only want to address your current medical conditions and concerns but also to detect new conditions early and prevent illness, disease and health-related problems.    Medicare offers a yearly Wellness Visit which allows our clinical staff to assess your need for preventative services including immunizations, lifestyle education, counseling to decrease risk of preventable diseases and screening for fall risk and other medical concerns.    This visit is provided free of charge (no copay) for all Medicare recipients. The clinical pharmacists at [facility] have begun to conduct these Wellness Visits which will also include a thorough review of all your medications.    As you primary medical provider recommend that you make an appointment for your Annual Wellness Visit if you have not done so already this year.  You may set up this appointment before you leave today or you may call back [phone]) and schedule an appointment.  Please make sure when you call that you mention that you are scheduling your Annual Wellness Visit with the clinical pharmacist so that the appointment  may be made for the proper length of time.     Continue current medications. Continue good therapeutic lifestyle changes which include good diet and exercise. Fall precautions discussed with patient. If an FOBT was given today- please return it to our front desk. If you are over 50 years old - you may need Prevnar 13 or the adult Pneumonia vaccine.  Do not forget to get your colonoscopy scheduled Drink more fluids, eat less called in your diet and try to get more exercise and work on getting the weight down   Don W. Moore MD   

## 2014-02-04 NOTE — Patient Instructions (Addendum)
Medicare Annual Wellness Visit  [place] and the medical providers at [facility] strive to bring you the best medical care.  In doing so we not only want to address your current medical conditions and concerns but also to detect new conditions early and prevent illness, disease and health-related problems.    Medicare offers a yearly Wellness Visit which allows our clinical staff to assess your need for preventative services including immunizations, lifestyle education, counseling to decrease risk of preventable diseases and screening for fall risk and other medical concerns.    This visit is provided free of charge (no copay) for all Medicare recipients. [facility] at [facility] have begun to conduct these Wellness Visits which will also include a thorough review of all your medications.    As you primary medical provider recommend that you make an appointment for your Annual Wellness Visit if you have not done so already this year.  You may set up this appointment before you leave today or you may call back [phone] and schedule an appointment.  Please make sure when you call that you mention that you are scheduling your Annual Wellness Visit with the clinical pharmacist so that the appointment may be made for the proper length of time.     Continue current medications. Continue good therapeutic lifestyle changes which include good diet and exercise. Fall precautions discussed with patient. If an FOBT was given today- please return it to our front desk. If you are over 71 years old - you may need Prevnar 13 or the adult Pneumonia vaccine.  Do not forget to get your colonoscopy scheduled Drink more fluids, eat less called in your diet and try to get more exercise and work on getting the weight down

## 2014-02-05 LAB — NMR, LIPOPROFILE
CHOLESTEROL: 110 mg/dL (ref <200)
HDL CHOLESTEROL BY NMR: 36 mg/dL — AB (ref >=40)
HDL Particle Number: 28.2 umol/L — ABNORMAL LOW (ref >=30.5)
LDL Particle Number: 746 nmol/L (ref <1000)
LDL Size: 19.9 nm (ref >20.5)
LDLC SERPL CALC-MCNC: 52 mg/dL (ref <100)
LP-IR Score: 63 — ABNORMAL HIGH (ref <=45)
SMALL LDL PARTICLE NUMBER: 508 nmol/L (ref <=527)
TRIGLYCERIDES BY NMR: 111 mg/dL (ref <150)

## 2014-02-05 LAB — BMP8+EGFR
BUN/Creatinine Ratio: 14 (ref 10–22)
BUN: 22 mg/dL (ref 8–27)
CO2: 24 mmol/L (ref 18–29)
CREATININE: 1.61 mg/dL — AB (ref 0.76–1.27)
Calcium: 9.1 mg/dL (ref 8.6–10.2)
Chloride: 101 mmol/L (ref 97–108)
GFR calc Af Amer: 49 mL/min/{1.73_m2} — ABNORMAL LOW (ref >59)
GFR calc non Af Amer: 43 mL/min/{1.73_m2} — ABNORMAL LOW (ref >59)
Glucose: 110 mg/dL — ABNORMAL HIGH (ref 65–99)
Potassium: 4.1 mmol/L (ref 3.5–5.2)
Sodium: 142 mmol/L (ref 134–144)

## 2014-02-05 LAB — PSA, TOTAL AND FREE
PSA, Free Pct: 40.6 %
PSA, Free: 0.69 ng/mL
PSA: 1.7 ng/mL (ref 0.0–4.0)

## 2014-02-05 LAB — HEPATIC FUNCTION PANEL
ALBUMIN: 4.7 g/dL (ref 3.5–4.8)
ALT: 46 IU/L — ABNORMAL HIGH (ref 0–44)
AST: 47 IU/L — ABNORMAL HIGH (ref 0–40)
Alkaline Phosphatase: 64 IU/L (ref 39–117)
BILIRUBIN TOTAL: 0.8 mg/dL (ref 0.0–1.2)
Bilirubin, Direct: 0.21 mg/dL (ref 0.00–0.40)
TOTAL PROTEIN: 6.9 g/dL (ref 6.0–8.5)

## 2014-02-05 LAB — VITAMIN D 25 HYDROXY (VIT D DEFICIENCY, FRACTURES): VIT D 25 HYDROXY: 29.3 ng/mL — AB (ref 30.0–100.0)

## 2014-05-20 ENCOUNTER — Telehealth: Payer: Self-pay | Admitting: Family Medicine

## 2014-05-20 NOTE — Telephone Encounter (Signed)
On our last record, the patient was taking 4000 daily and he should continue on with this amount

## 2014-05-20 NOTE — Telephone Encounter (Signed)
Pt aware to cont same dose

## 2014-08-06 ENCOUNTER — Telehealth: Payer: Self-pay | Admitting: Family Medicine

## 2014-08-09 ENCOUNTER — Other Ambulatory Visit: Payer: Self-pay | Admitting: *Deleted

## 2014-08-09 MED ORDER — LEVOTHYROXINE SODIUM 137 MCG PO TABS: 137.0000 ug | ORAL_TABLET | Freq: Every day | ORAL | Status: DC

## 2014-08-09 NOTE — Telephone Encounter (Signed)
Sent in 90 day supply to mail order pharmacy until patient can come for appt. Patient notified

## 2014-08-12 ENCOUNTER — Ambulatory Visit: Payer: Medicare Other | Admitting: Family Medicine

## 2014-08-15 ENCOUNTER — Telehealth: Payer: Self-pay | Admitting: Family Medicine

## 2014-08-15 MED ORDER — LEVOTHYROXINE SODIUM 137 MCG PO TABS: 137.0000 ug | ORAL_TABLET | Freq: Every day | ORAL | Status: DC

## 2014-08-15 NOTE — Telephone Encounter (Signed)
Pt aware and med sent to pharm 

## 2014-08-30 ENCOUNTER — Encounter: Payer: Self-pay | Admitting: Family Medicine

## 2014-08-30 ENCOUNTER — Ambulatory Visit (INDEPENDENT_AMBULATORY_CARE_PROVIDER_SITE_OTHER): Payer: Medicare Other | Admitting: Family Medicine

## 2014-08-30 VITALS — BP 122/80 | HR 70 | Temp 98.0°F | Ht 71.25 in | Wt 264.0 lb

## 2014-08-30 DIAGNOSIS — N189 Chronic kidney disease, unspecified: Secondary | ICD-10-CM

## 2014-08-30 DIAGNOSIS — Z23 Encounter for immunization: Secondary | ICD-10-CM

## 2014-08-30 DIAGNOSIS — E8881 Metabolic syndrome: Secondary | ICD-10-CM

## 2014-08-30 DIAGNOSIS — I10 Essential (primary) hypertension: Secondary | ICD-10-CM

## 2014-08-30 DIAGNOSIS — E039 Hypothyroidism, unspecified: Secondary | ICD-10-CM

## 2014-08-30 DIAGNOSIS — E785 Hyperlipidemia, unspecified: Secondary | ICD-10-CM

## 2014-08-30 DIAGNOSIS — Z1212 Encounter for screening for malignant neoplasm of rectum: Secondary | ICD-10-CM

## 2014-08-30 DIAGNOSIS — N400 Enlarged prostate without lower urinary tract symptoms: Secondary | ICD-10-CM

## 2014-08-30 DIAGNOSIS — G4700 Insomnia, unspecified: Secondary | ICD-10-CM

## 2014-08-30 DIAGNOSIS — E559 Vitamin D deficiency, unspecified: Secondary | ICD-10-CM

## 2014-08-30 LAB — POCT CBC
Granulocyte percent: 59.1 %G (ref 37–80)
HCT, POC: 45 % (ref 43.5–53.7)
Hemoglobin: 14.6 g/dL (ref 14.1–18.1)
LYMPH, POC: 2.6 (ref 0.6–3.4)
MCH, POC: 29.4 pg (ref 27–31.2)
MCHC: 32.5 g/dL (ref 31.8–35.4)
MCV: 90.6 fL (ref 80–97)
MPV: 6.9 fL (ref 0–99.8)
PLATELET COUNT, POC: 288 10*3/uL (ref 142–424)
POC GRANULOCYTE: 4.3 (ref 2–6.9)
POC LYMPH %: 36.2 % (ref 10–50)
RBC: 5 M/uL (ref 4.69–6.13)
RDW, POC: 13 %
WBC: 7.2 10*3/uL (ref 4.6–10.2)

## 2014-08-30 NOTE — Progress Notes (Signed)
Subjective:    [name], male    DOB: 01-14-1941, 73 y.o.   MRN: [id number]  HPI Pt here for follow up and management of chronic medical problems. The patient has been doing well overall. He did have one spell of lightheadedness with standing that lasted for about 3 hours. He checked his blood pressure and this was okay. His heart rate was a little bit fast. He also complains of some insomnia and attributes some of this to not being as active physically as he should be.        Patient Active Problem List   Diagnosis Date Noted  . Metabolic syndrome 46/96/2952  . Hyperlipidemia 01/31/2013  . Hypothyroid 01/31/2013  . BPH (benign prostatic hyperplasia) 01/31/2013  . CKD (chronic kidney disease) 01/31/2013  . HYPERTENSION, CONTROLLED 02/03/2010   Outpatient Encounter Prescriptions as of 08/30/2014  Medication Sig  . amLODipine (NORVASC) 10 MG tablet Take 1 tablet (10 mg total) by mouth daily.  Marland Kitchen aspirin 81 MG chewable tablet Chew 81 mg by mouth daily.  . Cholecalciferol (HM VITAMIN D3) 4000 UNITS CAPS Take 2,000 Units by mouth daily.   Marland Kitchen levothyroxine (LEVOTHROID) 25 MCG tablet Take 1 tablet (25 mcg total) by mouth daily before breakfast. One daily as directed  . levothyroxine (SYNTHROID, LEVOTHROID) 137 MCG tablet Take 1 tablet (137 mcg total) by mouth daily before breakfast.  . losartan (COZAAR) 100 MG tablet Take 1 tablet (100 mg total) by mouth daily.  Marland Kitchen lovastatin (MEVACOR) 40 MG tablet Take 1 tablet (40 mg total) by mouth at bedtime.  . Omega-3 Fatty Acids (FISH OIL) 1200 MG CAPS Take 1,200 mg by mouth daily.    Review of Systems  Constitutional: Negative.   HENT: Negative.   Eyes: Negative.   Respiratory: Negative.   Cardiovascular: Negative.   Gastrointestinal: Negative.   Endocrine: Negative.   Genitourinary: Negative.   Musculoskeletal: Negative.   Skin: Negative.   Allergic/Immunologic: Negative.   Neurological: Negative.   Hematological:  Negative.   Psychiatric/Behavioral: Negative.        Objective:   Physical Exam  Constitutional: He is oriented to person, place, and time. He appears well-developed and well-nourished. No distress.  Pleasant cooperative and alert  HENT:  Head: Normocephalic and atraumatic.  Right Ear: External ear normal.  Left Ear: External ear normal.  Nose: Nose normal.  Mouth/Throat: Oropharynx is clear and moist. No oropharyngeal exudate.  Eyes: Conjunctivae and EOM are normal. Pupils are equal, round, and reactive to light. Right eye exhibits no discharge. Left eye exhibits no discharge. No scleral icterus.  Neck: Normal range of motion. Neck supple. No thyromegaly present.  No carotid bruits or adenopathy  Cardiovascular: Normal rate, regular rhythm, normal heart sounds and intact distal pulses.  Exam reveals no gallop and no friction rub.   No murmur heard. At 60/m  Pulmonary/Chest: Effort normal and breath sounds normal. No respiratory distress. He has no wheezes. He has no rales. He exhibits no tenderness.  No axillary adenopathy  Abdominal: Soft. Bowel sounds are normal. He exhibits no mass. There is no tenderness. There is no rebound and no guarding.  No inguinal adenopathy  Musculoskeletal: Normal range of motion. He exhibits no edema or tenderness.  Lymphadenopathy:    He has no cervical adenopathy.  Neurological: He is alert and oriented to person, place, and time. He has normal reflexes. No cranial nerve deficit.  Skin: Skin is warm and dry. No rash noted. No erythema. No  pallor.  Psychiatric: He has a normal mood and affect. His behavior is normal. Judgment and thought content normal.  Nursing note and vitals reviewed.  BP 122/80 mmHg  Pulse 70  Temp(Src) 98 F (36.7 C) (Oral)  Ht 5' 11.25" (1.81 m)  Wt 264 lb (119.75 kg)  BMI 36.55 kg/m2        Assessment & Plan:  1. Hyperlipidemia - POCT CBC - NMR, lipoprofile  2. HYPERTENSION, CONTROLLED - POCT CBC -  BMP8+EGFR - Hepatic function panel  3. Hypothyroidism, unspecified hypothyroidism type - POCT CBC - Thyroid Panel With TSH  4. CKD (chronic kidney disease), unspecified stage - POCT CBC - BMP8+EGFR  5. BPH (benign prostatic hyperplasia) - POCT CBC  6. Metabolic syndrome - POCT CBC - BMP8+EGFR  7. Vitamin D deficiency - POCT CBC - Vit D  25 hydroxy (rtn osteoporosis monitoring)  8. Insomnia -try melatonin as directed  Patient Instructions                       Medicare Annual Wellness Visit  [place] and the medical providers at [facility] strive to bring you the best medical care.  In doing so we not only want to address your current medical conditions and concerns but also to detect new conditions early and prevent illness, disease and health-related problems.    Medicare offers a yearly Wellness Visit which allows our clinical staff to assess your need for preventative services including immunizations, lifestyle education, counseling to decrease risk of preventable diseases and screening for fall risk and other medical concerns.    This visit is provided free of charge (no copay) for all Medicare recipients. The clinical pharmacists at [facility] have begun to conduct these Wellness Visits which will also include a thorough review of all your medications.    As you primary medical provider recommend that you make an appointment for your Annual Wellness Visit if you have not done so already this year.  You may set up this appointment before you leave today or you may call back [phone]) and schedule an appointment.  Please make sure when you call that you mention that you are scheduling your Annual Wellness Visit with the clinical pharmacist so that the appointment may be made for the proper length of time.     Continue current medications. Continue good therapeutic lifestyle changes which include good diet and exercise. Fall  precautions discussed with patient. If an FOBT was given today- please return it to our front desk. If you are over 44 years old - you may need Prevnar 66 or the adult Pneumonia vaccine.  Flu Shots will be available at our office starting mid- September. Please call and schedule a FLU CLINIC APPOINTMENT.   Try melatonin over-the-counter for insomnia Continue to monitor the lightheaded spells, if they continue check blood pressure, pulse rates and regularity and keep [place] aware of those results We will call you with your lab work results once those results are available   Arrie Senate MD

## 2014-08-30 NOTE — Patient Instructions (Addendum)
Medicare Annual Wellness Visit  [place] and the medical providers at [facility] strive to bring you the best medical care.  In doing so we not only want to address your current medical conditions and concerns but also to detect new conditions early and prevent illness, disease and health-related problems.    Medicare offers a yearly Wellness Visit which allows our clinical staff to assess your need for preventative services including immunizations, lifestyle education, counseling to decrease risk of preventable diseases and screening for fall risk and other medical concerns.    This visit is provided free of charge (no copay) for all Medicare recipients. The [facility] pharmacists at [facility] have begun to conduct these Wellness Visits which will also include a thorough review of all your medications.    As you primary medical provider recommend that you make an appointment for your Annual Wellness Visit if you have not done so already this year.  You may set up this appointment before you leave today or you may call back [phone] and schedule an appointment.  Please make sure when you call that you mention that you are scheduling your Annual Wellness Visit with the clinical pharmacist so that the appointment may be made for the proper length of time.     Continue current medications. Continue good therapeutic lifestyle changes which include good diet and exercise. Fall precautions discussed with patient. If an FOBT was given today- please return it to our front desk. If you are over 71 years old - you may need Prevnar 13 or the adult Pneumonia vaccine.  Flu Shots will be available at our office starting mid- September. Please call and schedule a FLU CLINIC APPOINTMENT.   Try melatonin over-the-counter for insomnia Continue to monitor the lightheaded spells, if they continue check blood pressure, pulse rates and  regularity and keep us aware of those results We will call you with your lab work results once those results are available

## 2014-08-30 NOTE — Addendum Note (Signed)
Addended by: Lisbeth PlyMOORE, Shemika Robbs C on: 08/30/2014 03:30 PM   Modules accepted: Orders

## 2014-08-31 LAB — HEPATIC FUNCTION PANEL
ALK PHOS: 61 IU/L (ref 39–117)
ALT: 41 IU/L (ref 0–44)
AST: 43 IU/L — ABNORMAL HIGH (ref 0–40)
Albumin: 4.4 g/dL (ref 3.5–4.8)
Bilirubin, Direct: 0.25 mg/dL (ref 0.00–0.40)
Total Bilirubin: 0.8 mg/dL (ref 0.0–1.2)
Total Protein: 7 g/dL (ref 6.0–8.5)

## 2014-08-31 LAB — BMP8+EGFR
BUN/Creatinine Ratio: 12 (ref 10–22)
BUN: 20 mg/dL (ref 8–27)
CALCIUM: 9.4 mg/dL (ref 8.6–10.2)
CO2: 25 mmol/L (ref 18–29)
Chloride: 101 mmol/L (ref 97–108)
Creatinine, Ser: 1.7 mg/dL — ABNORMAL HIGH (ref 0.76–1.27)
GFR calc non Af Amer: 40 mL/min/{1.73_m2} — ABNORMAL LOW (ref >59)
GFR, EST AFRICAN AMERICAN: 46 mL/min/{1.73_m2} — AB (ref >59)
Glucose: 104 mg/dL — ABNORMAL HIGH (ref 65–99)
Potassium: 4.3 mmol/L (ref 3.5–5.2)
Sodium: 141 mmol/L (ref 134–144)

## 2014-08-31 LAB — NMR, LIPOPROFILE
CHOLESTEROL: 99 mg/dL — AB (ref 100–199)
HDL Cholesterol by NMR: 37 mg/dL — ABNORMAL LOW (ref >39)
HDL Particle Number: 26.6 umol/L — ABNORMAL LOW (ref >=30.5)
LDL Particle Number: 763 nmol/L (ref <1000)
LDL Size: 20.1 nm (ref >20.5)
LDL-C: 43 mg/dL (ref 0–99)
LP-IR SCORE: 62 — AB (ref <=45)
Small LDL Particle Number: 446 nmol/L (ref <=527)
Triglycerides by NMR: 96 mg/dL (ref 0–149)

## 2014-08-31 LAB — THYROID PANEL WITH TSH
Free Thyroxine Index: 3 (ref 1.2–4.9)
T3 Uptake Ratio: 27 % (ref 24–39)
T4 TOTAL: 11.1 ug/dL (ref 4.5–12.0)
TSH: 4.68 u[IU]/mL — ABNORMAL HIGH (ref 0.450–4.500)

## 2014-08-31 LAB — FECAL OCCULT BLOOD, IMMUNOCHEMICAL: FECAL OCCULT BLD: NEGATIVE

## 2014-08-31 LAB — VITAMIN D 25 HYDROXY (VIT D DEFICIENCY, FRACTURES): VIT D 25 HYDROXY: 39.1 ng/mL (ref 30.0–100.0)

## 2014-09-02 ENCOUNTER — Telehealth: Payer: Self-pay | Admitting: Family Medicine

## 2014-09-02 ENCOUNTER — Telehealth: Payer: Self-pay | Admitting: *Deleted

## 2014-09-02 DIAGNOSIS — E039 Hypothyroidism, unspecified: Secondary | ICD-10-CM

## 2014-09-02 DIAGNOSIS — R7989 Other specified abnormal findings of blood chemistry: Secondary | ICD-10-CM

## 2014-09-02 NOTE — Telephone Encounter (Signed)
Patient aware of results and will call back with name of  Nephrologist.

## 2014-09-02 NOTE — Telephone Encounter (Signed)
Please send a copy of the patient's lab work to Dr. Diona BrownerMartin Webband arrange a follow-up visit because of the higher creatinine

## 2014-09-03 NOTE — Telephone Encounter (Signed)
Labs sent

## 2014-09-04 ENCOUNTER — Ambulatory Visit (INDEPENDENT_AMBULATORY_CARE_PROVIDER_SITE_OTHER): Payer: Medicare Other | Admitting: *Deleted

## 2014-09-04 DIAGNOSIS — Z23 Encounter for immunization: Secondary | ICD-10-CM

## 2014-09-04 NOTE — Telephone Encounter (Signed)
Please find out when he is going to see Dr Hyman HopesWebb again and please make sure that Dr. Hyman HopesWebb of gets a copy of his recent lab work

## 2014-09-04 NOTE — Telephone Encounter (Signed)
Dr Christell ConstantMoore- he wants to be referred to endocrinologist for thyroid.  Is this ok?  We will also do referral to Dr Elvis CoilMartin Webb- it has been several yrs since he seen them.

## 2014-09-04 NOTE — Addendum Note (Signed)
Addended by: Magdalene RiverBULLINS, JAMIE H on: 09/04/2014 06:18 PM   Modules accepted: Orders

## 2014-09-13 ENCOUNTER — Telehealth: Payer: Self-pay | Admitting: Family Medicine

## 2014-09-13 MED ORDER — AMLODIPINE BESYLATE 10 MG PO TABS: 10.0000 mg | ORAL_TABLET | Freq: Every day | ORAL | Status: DC

## 2014-09-13 MED ORDER — LOVASTATIN 40 MG PO TABS: 40.0000 mg | ORAL_TABLET | Freq: Every day | ORAL | Status: DC

## 2014-09-13 MED ORDER — LOSARTAN POTASSIUM 100 MG PO TABS: 100.0000 mg | ORAL_TABLET | Freq: Every day | ORAL | Status: DC

## 2014-09-13 NOTE — Telephone Encounter (Signed)
done

## 2014-09-23 ENCOUNTER — Other Ambulatory Visit: Payer: Self-pay

## 2014-09-23 ENCOUNTER — Telehealth: Payer: Self-pay | Admitting: Family Medicine

## 2014-09-23 MED ORDER — LEVOTHYROXINE SODIUM 25 MCG PO TABS: 25.0000 ug | ORAL_TABLET | Freq: Every day | ORAL | Status: DC

## 2014-09-26 ENCOUNTER — Other Ambulatory Visit: Payer: Self-pay | Admitting: *Deleted

## 2014-09-26 MED ORDER — LEVOTHYROXINE SODIUM 25 MCG PO TABS: 25.0000 ug | ORAL_TABLET | Freq: Every day | ORAL | Status: DC

## 2014-12-23 ENCOUNTER — Telehealth: Payer: Self-pay | Admitting: Family Medicine

## 2014-12-23 DIAGNOSIS — E8881 Metabolic syndrome: Secondary | ICD-10-CM

## 2014-12-23 DIAGNOSIS — E039 Hypothyroidism, unspecified: Secondary | ICD-10-CM

## 2014-12-23 NOTE — Telephone Encounter (Signed)
Patient went to see Dr. 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 He is just concerned since he is not a diabetic and not anemic. He states Dr. Marland McalpineWebb's office drew his TSH and it was 9.36 and he wants to know what he needs to do about this can you adjust medications or should follow up with the endocronilogist. Please see scanned document in labs on page 6 for TSH results.

## 2014-12-23 NOTE — Telephone Encounter (Signed)
Please call GIM and find out what is going on with him that he needs to talk to us about regarding the visit to Dr. Hyman HopesWebb.

## 2014-12-23 NOTE — Telephone Encounter (Signed)
As discussed with me today how we can adjust the medications to get the thyroid under better control

## 2014-12-24 NOTE — Telephone Encounter (Signed)
Lm - to call back to confirm thyroid meds and recent levels

## 2014-12-25 NOTE — Telephone Encounter (Signed)
Pt went to see Dr Claiborne Riggber x 1 visit for hypothyroid. He changed med to a one a day 150mcg. Since the change pt has rechecked the level at Dr Hebert SohoWebbs office and it was a lot higher.   He wants [facility] to handle this level and meds , he is not going back to [facility].    Will discuss all levels and other comments with [facility] on Thursday.

## 2014-12-25 NOTE — Telephone Encounter (Signed)
Please find out how long he has been on 150 g of Synthroid. Please confirm the previous total dose of Synthroid before Dr. Claiborne Riggber put him on 150 g.. Please get the current results on his thyroid profile from Dr. Hyman HopesWebb or find it in the computer record. How long had he been on the 150 g of Synthroid before Dr. Hyman HopesWebb did this lab work on the thyroid.?

## 2014-12-26 MED ORDER — LEVOTHYROXINE SODIUM 25 MCG PO TABS
25.0000 ug | ORAL_TABLET | Freq: Every day | ORAL | Status: DC
Start: 2014-12-26 — End: 2015-04-01

## 2014-12-26 NOTE — Telephone Encounter (Signed)
Done

## 2015-02-27 ENCOUNTER — Ambulatory Visit: Payer: Medicare Other | Admitting: Family Medicine

## 2015-04-01 ENCOUNTER — Telehealth: Payer: Self-pay | Admitting: Family Medicine

## 2015-04-01 ENCOUNTER — Other Ambulatory Visit: Payer: Self-pay

## 2015-04-01 MED ORDER — LEVOTHYROXINE SODIUM 25 MCG PO TABS: 25.0000 ug | ORAL_TABLET | Freq: Every day | ORAL | Status: DC

## 2015-04-01 MED ORDER — LOSARTAN POTASSIUM 100 MG PO TABS: 100.0000 mg | ORAL_TABLET | Freq: Every day | ORAL | Status: DC

## 2015-04-01 MED ORDER — AMLODIPINE BESYLATE 10 MG PO TABS: 10.0000 mg | ORAL_TABLET | Freq: Every day | ORAL | Status: DC

## 2015-04-01 MED ORDER — LOVASTATIN 40 MG PO TABS: 40.0000 mg | ORAL_TABLET | Freq: Every day | ORAL | Status: DC

## 2015-04-04 MED ORDER — LOVASTATIN 40 MG PO TABS: 40.0000 mg | ORAL_TABLET | Freq: Every day | ORAL | Status: DC

## 2015-04-04 MED ORDER — AMLODIPINE BESYLATE 10 MG PO TABS: 10.0000 mg | ORAL_TABLET | Freq: Every day | ORAL | Status: DC

## 2015-04-04 MED ORDER — LEVOTHYROXINE SODIUM 25 MCG PO TABS: 25.0000 ug | ORAL_TABLET | Freq: Every day | ORAL | Status: DC

## 2015-04-04 MED ORDER — LOSARTAN POTASSIUM 100 MG PO TABS: 100.0000 mg | ORAL_TABLET | Freq: Every day | ORAL | Status: DC

## 2015-04-04 NOTE — Telephone Encounter (Signed)
fixed

## 2015-04-25 ENCOUNTER — Other Ambulatory Visit: Payer: Self-pay

## 2015-04-25 ENCOUNTER — Telehealth: Payer: Self-pay | Admitting: Family Medicine

## 2015-04-25 MED ORDER — LEVOTHYROXINE SODIUM 25 MCG PO TABS: 25.0000 ug | ORAL_TABLET | Freq: Every day | ORAL | Status: DC

## 2015-04-25 NOTE — Telephone Encounter (Signed)
Patient called and said he needed his levothyroxine 150 mcgm   This med no on EPIC list at all.   25 mcgm are on list

## 2015-04-25 NOTE — Telephone Encounter (Signed)
He was receiving a script for 137mcg but that hasn't been written since 07/2014. I spoke with his pharmacy and their records mirror ours. They have been filling 25mcg and don't have a record of 150mcg. They last filled 137mcg in 07/2014. Called patient and there was no answer and no voicemail. Will attempt again later.

## 2015-04-29 NOTE — Telephone Encounter (Signed)
x

## 2015-06-02 ENCOUNTER — Ambulatory Visit (INDEPENDENT_AMBULATORY_CARE_PROVIDER_SITE_OTHER): Payer: Medicare Other

## 2015-06-02 ENCOUNTER — Encounter: Payer: Self-pay | Admitting: Family Medicine

## 2015-06-02 ENCOUNTER — Ambulatory Visit (INDEPENDENT_AMBULATORY_CARE_PROVIDER_SITE_OTHER): Payer: Medicare Other | Admitting: Family Medicine

## 2015-06-02 VITALS — BP 121/78 | HR 69 | Temp 97.1°F | Ht 71.25 in | Wt 254.4 lb

## 2015-06-02 DIAGNOSIS — E785 Hyperlipidemia, unspecified: Secondary | ICD-10-CM

## 2015-06-02 DIAGNOSIS — M79645 Pain in left finger(s): Secondary | ICD-10-CM

## 2015-06-02 DIAGNOSIS — R972 Elevated prostate specific antigen [PSA]: Secondary | ICD-10-CM

## 2015-06-02 DIAGNOSIS — I10 Essential (primary) hypertension: Secondary | ICD-10-CM

## 2015-06-02 DIAGNOSIS — L570 Actinic keratosis: Secondary | ICD-10-CM | POA: Diagnosis not present

## 2015-06-02 DIAGNOSIS — E039 Hypothyroidism, unspecified: Secondary | ICD-10-CM

## 2015-06-02 DIAGNOSIS — N400 Enlarged prostate without lower urinary tract symptoms: Secondary | ICD-10-CM

## 2015-06-02 DIAGNOSIS — E559 Vitamin D deficiency, unspecified: Secondary | ICD-10-CM | POA: Diagnosis not present

## 2015-06-02 NOTE — Patient Instructions (Addendum)
Continue current medications. Continue good therapeutic lifestyle changes which include good diet and exercise. Fall precautions discussed with patient. If an FOBT was given today- please return it to our front desk. If you are over 72 years old - you may need Prevnar 13 or the adult Pneumonia vaccine.   After your visit with [place] today you will receive a survey in the mail or online from [employer] regarding your care with [place]. Please take a moment to fill this out. Your feedback is very important to [place] as you can help [place] better understand your patient needs as well as improve your experience and satisfaction. WE CARE ABOUT YOU!!!   Continue the summer drinking plenty of water and fluids Continue with weight loss regimen Continue to watch sodium intake Continue to be careful and do not put yourself at risk for falling We will call you with the lab work results once they become available If the skin lesion does not resolve after the cryotherapy today please give [place] a call in 4-6 weeks

## 2015-06-02 NOTE — Progress Notes (Signed)
Subjective:    [name], male    DOB: Apr 16, 1941, 74 y.o.   MRN: [id number]  HPI  Pt here for follow up and management of chronic medical problems which includes hypertension, hyperlipidemia and hypothyroidism.  He is taking medications regularly. The patient is doing well today. He is complaining of a up on his neck that has been going on for about 3 months and some left middle finger pain for about a week. He is due to get a chest x-ray today and lab work today. Is also due for PSA and prostate exam. The patient denies chest pain or shortness of breath. He is active physically without any discomfort. He is not having any trouble with his GI tract which include swallowing heartburn indigestion nausea vomiting diarrhea or blood in the stool. He does have occasional constipation. He is taking a high fiber medication over-the-counter and is trying to drink more fluids. He is having no problems passing his water. His sex life is okay without medication.           Patient Active Problem List   Diagnosis Date Noted  . Metabolic syndrome 02/04/2014  . Hyperlipidemia 01/31/2013  . Hypothyroid 01/31/2013  . BPH (benign prostatic hyperplasia) 01/31/2013  . CKD (chronic kidney disease) 01/31/2013  . HYPERTENSION, CONTROLLED 02/03/2010   Outpatient Encounter Prescriptions as of 06/02/2015  Medication Sig  . amLODipine (NORVASC) 10 MG tablet Take 1 tablet (10 mg total) by mouth daily.  Marland Kitchen aspirin 81 MG chewable tablet Chew 81 mg by mouth daily.  . Cholecalciferol (HM VITAMIN D3) 4000 UNITS CAPS Take 2,000 Units by mouth daily.   Marland Kitchen levothyroxine (LEVOTHROID) 25 MCG tablet Take 1 tablet (25 mcg total) by mouth daily before breakfast.  . losartan (COZAAR) 100 MG tablet Take 1 tablet (100 mg total) by mouth daily.  Marland Kitchen lovastatin (MEVACOR) 40 MG tablet Take 1 tablet (40 mg total) by mouth at bedtime.  . Omega-3 Fatty Acids (FISH OIL) 1200 MG CAPS Take 1,200 mg by mouth daily.   No  facility-administered encounter medications on file as of 06/02/2015.     Review of Systems  Constitutional: Negative.   HENT: Negative.   Eyes: Negative.   Respiratory: Negative.   Cardiovascular: Negative.   Gastrointestinal: Negative.   Endocrine: Negative.   Genitourinary: Negative.   Musculoskeletal: Negative.        Left finger pain  Skin: Negative.        Bump on neck  Allergic/Immunologic: Negative.   Neurological: Negative.   Hematological: Negative.   Psychiatric/Behavioral: Negative.        Objective:   Physical Exam  Constitutional: He is oriented to person, place, and time. He appears well-developed and well-nourished. No distress.  HENT:  Head: Normocephalic and atraumatic.  Right Ear: External ear normal.  Left Ear: External ear normal.  Nose: Nose normal.  Mouth/Throat: Oropharynx is clear and moist. No oropharyngeal exudate.  Eyes: Conjunctivae and EOM are normal. Pupils are equal, round, and reactive to light. Right eye exhibits no discharge. Left eye exhibits no discharge. No scleral icterus.  Neck: Normal range of motion. Neck supple. No thyromegaly present.  Without bruits or thyroid enlargement  Cardiovascular: Normal rate, regular rhythm, normal heart sounds and intact distal pulses.  Exam reveals no gallop and no friction rub.   No murmur heard. At 72/m with a regular rate and rhythm  Pulmonary/Chest: Effort normal and breath sounds normal. No respiratory distress. He has no wheezes.  He has no rales. He exhibits no tenderness.  Axillary is negative  Abdominal: Soft. Bowel sounds are normal. He exhibits no mass. There is no tenderness. There is no rebound and no guarding.  No masses tenderness or organ enlargement  Genitourinary: Rectum normal and penis normal.  The prostate is enlarged but soft and smooth. There were no rectal masses. The genitalia were normal and there were no inguinal hernias or inguinal nodes palpable.  Musculoskeletal: Normal  range of motion. He exhibits no edema or tenderness.  Lymphadenopathy:    He has no cervical adenopathy.  Neurological: He is alert and oriented to person, place, and time. He has normal reflexes. No cranial nerve deficit.  Skin: Skin is warm and dry. No rash noted. No erythema. No pallor.  There was a raised lesion which appears to be an actinic keratosis at the left neck area. Cryotherapy was performed without problems  Psychiatric: He has a normal mood and affect. His behavior is normal. Judgment and thought content normal.  Nursing note and vitals reviewed.  BP 121/78 mmHg  Pulse 69  Temp(Src) 97.1 F (36.2 C) (Oral)  Ht 5' 11.25" (1.81 m)  Wt 254 lb 6.4 oz (115.395 kg)  BMI 35.22 kg/m2  [facility] reading (PRIMARY) by  Dr.Moore-chest x-ray and left middle finger  --- no active disease, no sign of fracture in hand finger                                     Assessment & Plan:  1. Hypothyroidism, unspecified hypothyroidism type -He is taking his medication regularly and is not having any side effects of his medical condition. Lab work is pending and we will adjust medication as needed - POCT CBC - Thyroid Panel With TSH  2. Hyperlipidemia -He is actually losing some weight and he continues to take his lovastatin regularly - POCT CBC - BMP8+EGFR - Hepatic function panel - NMR, lipoprofile  3. HYPERTENSION, CONTROLLED -The blood pressure is good today and at home has been running in the 120s  over the 70s. He should continue with his current treatment. - POCT CBC - BMP8+EGFR - Hepatic function panel - DG Chest 2 View; Future  4. BPH (benign prostatic hyperplasia) -The patient is having no symptoms with his prostate. - POCT CBC - BMP8+EGFR - Hepatic function panel - NMR, lipoprofile - PSA  5. Vitamin D deficiency -Patient should continue with current vitamin D replacement pending results of lab work - POCT CBC - Vit D  25 hydroxy (rtn osteoporosis monitoring)  6.  Finger pain, left -This was injured when cranking a chainsaw and this is been going on for about a week. - DG Finger Middle Left; Future  7. Actinic keratosis -The lesion on his left neck had been there for about 3 months and appears to be an actinic keratosis and cryotherapy was performed without problems.  No orders of the defined types were placed in this encounter.   Patient Instructions  Continue current medications. Continue good therapeutic lifestyle changes which include good diet and exercise. Fall precautions discussed with patient. If an FOBT was given today- please return it to our front desk. If you are over 33 years old - you may need Prevnar 20 or the adult Pneumonia vaccine.   After your visit with [place] today you will receive a survey in the mail or online from [employer] regarding your care  with [place]. Please take a moment to fill this out. Your feedback is very important to [place] as you can help [place] better understand your patient needs as well as improve your experience and satisfaction. WE CARE ABOUT YOU!!!   Continue the summer drinking plenty of water and fluids Continue with weight loss regimen Continue to watch sodium intake Continue to be careful and do not put yourself at risk for falling We will call you with the lab work results once they become available If the skin lesion does not resolve after the cryotherapy today please give [place] a call in 4-6 weeks   Arrie Senate MD

## 2015-06-03 ENCOUNTER — Telehealth: Payer: Self-pay | Admitting: *Deleted

## 2015-06-03 LAB — THYROID PANEL WITH TSH
FREE THYROXINE INDEX: 3.3 (ref 1.2–4.9)
T3 UPTAKE RATIO: 27 % (ref 24–39)
T4, Total: 12.1 ug/dL — ABNORMAL HIGH (ref 4.5–12.0)
TSH: 2.29 u[IU]/mL (ref 0.450–4.500)

## 2015-06-03 LAB — BMP8+EGFR
BUN/Creatinine Ratio: 13 (ref 10–22)
BUN: 19 mg/dL (ref 8–27)
CALCIUM: 9.3 mg/dL (ref 8.6–10.2)
CO2: 24 mmol/L (ref 18–29)
Chloride: 100 mmol/L (ref 97–108)
Creatinine, Ser: 1.51 mg/dL — ABNORMAL HIGH (ref 0.76–1.27)
GFR calc Af Amer: 53 mL/min/{1.73_m2} — ABNORMAL LOW (ref >59)
GFR calc non Af Amer: 45 mL/min/{1.73_m2} — ABNORMAL LOW (ref >59)
Glucose: 106 mg/dL — ABNORMAL HIGH (ref 65–99)
Potassium: 4.4 mmol/L (ref 3.5–5.2)
Sodium: 140 mmol/L (ref 134–144)

## 2015-06-03 LAB — CBC WITH DIFFERENTIAL/PLATELET
Basophils Absolute: 0.1 10*3/uL (ref 0.0–0.2)
Basos: 1 %
EOS (ABSOLUTE): 0.4 10*3/uL (ref 0.0–0.4)
Eos: 5 %
Hematocrit: 43.9 % (ref 37.5–51.0)
Hemoglobin: 15.4 g/dL (ref 12.6–17.7)
IMMATURE GRANS (ABS): 0 10*3/uL (ref 0.0–0.1)
Immature Granulocytes: 0 %
Lymphocytes Absolute: 2.6 10*3/uL (ref 0.7–3.1)
Lymphs: 34 %
MCH: 30.6 pg (ref 26.6–33.0)
MCHC: 35.1 g/dL (ref 31.5–35.7)
MCV: 87 fL (ref 79–97)
MONOCYTES: 12 %
Monocytes Absolute: 1 10*3/uL — ABNORMAL HIGH (ref 0.1–0.9)
NEUTROS ABS: 3.6 10*3/uL (ref 1.4–7.0)
Neutrophils: 48 %
PLATELETS: 271 10*3/uL (ref 150–379)
RBC: 5.04 x10E6/uL (ref 4.14–5.80)
RDW: 13.5 % (ref 12.3–15.4)
WBC: 7.6 10*3/uL (ref 3.4–10.8)

## 2015-06-03 LAB — NMR, LIPOPROFILE
CHOLESTEROL: 108 mg/dL (ref 100–199)
HDL CHOLESTEROL BY NMR: 41 mg/dL (ref >39)
HDL Particle Number: 27.1 umol/L — ABNORMAL LOW (ref >=30.5)
LDL Particle Number: 735 nmol/L (ref <1000)
LDL SIZE: 20 nm (ref >20.5)
LDL-C: 47 mg/dL (ref 0–99)
LP-IR SCORE: 55 — AB (ref <=45)
SMALL LDL PARTICLE NUMBER: 445 nmol/L (ref <=527)
Triglycerides by NMR: 102 mg/dL (ref 0–149)

## 2015-06-03 LAB — HEPATIC FUNCTION PANEL
ALT: 27 IU/L (ref 0–44)
AST: 26 IU/L (ref 0–40)
Albumin: 4.5 g/dL (ref 3.5–4.8)
Alkaline Phosphatase: 68 IU/L (ref 39–117)
Bilirubin Total: 0.8 mg/dL (ref 0.0–1.2)
Bilirubin, Direct: 0.25 mg/dL (ref 0.00–0.40)
Total Protein: 7.2 g/dL (ref 6.0–8.5)

## 2015-06-03 LAB — VITAMIN D 25 HYDROXY (VIT D DEFICIENCY, FRACTURES): VIT D 25 HYDROXY: 37.9 ng/mL (ref 30.0–100.0)

## 2015-06-03 LAB — PSA: PROSTATE SPECIFIC AG, SERUM: 2.6 ng/mL (ref 0.0–4.0)

## 2015-06-03 NOTE — Telephone Encounter (Signed)
-----   Message from Magdalene River, LPN sent at 10/30/1094  9:55 AM EDT -----   ----- Message -----    From: Ernestina Penna, MD    Sent: 06/03/2015   7:21 AM      To: Earle Gell Bullins, LPN, [facility] Clinical Pool A  The CBC has a normal white blood cell count. The hemoglobin is good at 15.4. The platelet count is good and within normal limits. The blood sugar is slightly elevated at 106. The creatinine remains slightly elevated but lower than it was 9 months ago at 1.51. This is good that it is lower. The electrolytes including potassium are within normal limits. All liver function tests are within normal limits The vitamin D level is 37.9 and this is about what it was 9 months ago.----- the patient should continue with his current vitamin D replacement. The TSH, the most important thyroid test remains within normal limits and the patient should continue with his current dose in treatment of thyroid  Cholesterol numbers with advanced lipid testing are excellent except the good cholesterol or the HDL particle number remains decreased and this is been the case in the past. He should continue with his current treatment and with as aggressive therapeutic lifestyle changes as possible which include diet and exercise The PSA is 2.6. One year ago this number was 1.7. The rate of rise should be no more than 0.75 per year. Please have the patient and his convenience to repeat the PSA in about 4 weeks. He does not have to be fasting.+++++++

## 2015-06-04 NOTE — Addendum Note (Signed)
Addended by: Tamera Punt on: 06/04/2015 10:52 AM   Modules accepted: Orders

## 2015-06-04 NOTE — Telephone Encounter (Signed)
Patient aware of results.

## 2015-07-04 ENCOUNTER — Other Ambulatory Visit: Payer: Self-pay | Admitting: Family Medicine

## 2015-08-15 ENCOUNTER — Other Ambulatory Visit (INDEPENDENT_AMBULATORY_CARE_PROVIDER_SITE_OTHER): Payer: Medicare Other

## 2015-08-15 DIAGNOSIS — Z23 Encounter for immunization: Secondary | ICD-10-CM

## 2015-08-15 DIAGNOSIS — R972 Elevated prostate specific antigen [PSA]: Secondary | ICD-10-CM

## 2015-08-15 NOTE — Progress Notes (Signed)
Lab only 

## 2015-08-16 LAB — PSA, TOTAL AND FREE
PSA, Free Pct: 40 %
PSA, Free: 0.96 ng/mL
Prostate Specific Ag, Serum: 2.4 ng/mL (ref 0.0–4.0)

## 2015-10-02 ENCOUNTER — Other Ambulatory Visit: Payer: Self-pay | Admitting: Family Medicine

## 2015-10-23 ENCOUNTER — Other Ambulatory Visit: Payer: Self-pay | Admitting: Family Medicine

## 2015-10-26 HISTORY — PX: COLONOSCOPY: SHX174

## 2015-12-01 DIAGNOSIS — Z0100 Encounter for examination of eyes and vision without abnormal findings: Secondary | ICD-10-CM | POA: Diagnosis not present

## 2016-01-26 ENCOUNTER — Other Ambulatory Visit: Payer: Self-pay | Admitting: *Deleted

## 2016-01-26 MED ORDER — LOSARTAN POTASSIUM 100 MG PO TABS: ORAL_TABLET | ORAL | Status: DC

## 2016-01-26 MED ORDER — LOVASTATIN 40 MG PO TABS: ORAL_TABLET | ORAL | Status: DC

## 2016-01-26 MED ORDER — AMLODIPINE BESYLATE 10 MG PO TABS: ORAL_TABLET | ORAL | Status: DC

## 2016-01-26 MED ORDER — LEVOTHYROXINE SODIUM 25 MCG PO TABS: 25.0000 ug | ORAL_TABLET | Freq: Every day | ORAL | Status: DC

## 2016-02-13 ENCOUNTER — Ambulatory Visit (INDEPENDENT_AMBULATORY_CARE_PROVIDER_SITE_OTHER): Payer: Medicare Other | Admitting: Family Medicine

## 2016-02-13 ENCOUNTER — Encounter: Payer: Self-pay | Admitting: Family Medicine

## 2016-02-13 VITALS — BP 102/65 | HR 71 | Temp 97.4°F | Ht 71.25 in | Wt 257.0 lb

## 2016-02-13 DIAGNOSIS — B349 Viral infection, unspecified: Secondary | ICD-10-CM | POA: Diagnosis not present

## 2016-02-13 DIAGNOSIS — E559 Vitamin D deficiency, unspecified: Secondary | ICD-10-CM | POA: Diagnosis not present

## 2016-02-13 DIAGNOSIS — M79644 Pain in right finger(s): Secondary | ICD-10-CM

## 2016-02-13 DIAGNOSIS — Z1211 Encounter for screening for malignant neoplasm of colon: Secondary | ICD-10-CM

## 2016-02-13 DIAGNOSIS — I10 Essential (primary) hypertension: Secondary | ICD-10-CM | POA: Diagnosis not present

## 2016-02-13 DIAGNOSIS — N400 Enlarged prostate without lower urinary tract symptoms: Secondary | ICD-10-CM

## 2016-02-13 DIAGNOSIS — E039 Hypothyroidism, unspecified: Secondary | ICD-10-CM | POA: Diagnosis not present

## 2016-02-13 DIAGNOSIS — E785 Hyperlipidemia, unspecified: Secondary | ICD-10-CM | POA: Diagnosis not present

## 2016-02-13 NOTE — Progress Notes (Signed)
Subjective:    [name], male    DOB: August 26, 1941, 75 y.o.   MRN: [id number]  HPI Pt here for follow up and management of chronic medical problems which includes hypothyroid, hypertension, and hyperlipidemia. He is taking medications regularly.The patient has had some fever and cough and congestion and sinus drainage. He also complains of some arthralgias in his left middle finger. He will be given an FOBT to return. He will get his lab work today. He still continues to see the nephrologist periodically. His body mass index is 35.3. He does have hypertension and hyperlipidemia and this includedhim in the morbid obesity category.Other than the recent viral syndrome with cough congestion and nausea, from which she is now getting better he denies any chest pain or shortness of breath. He's having no problems with his GI tract with heartburn indigestion nausea vomiting diarrhea or blood in the stool. He is passing his water without problems. He stays active. He still has some problems with his left middle finger and with fully extending it and has most likely torn a small ligament and this PIP joint. We will refer him to a hand specialist to follow-up on this. He is in good spirits and has a positive demeanor.       Patient Active Problem List   Diagnosis Date Noted  . Metabolic syndrome 94/85/4627  . Hyperlipidemia 01/31/2013  . Hypothyroid 01/31/2013  . BPH (benign prostatic hyperplasia) 01/31/2013  . CKD (chronic kidney disease) 01/31/2013  . HYPERTENSION, CONTROLLED 02/03/2010   Outpatient Encounter Prescriptions as of 02/13/2016  Medication Sig  . amLODipine (NORVASC) 10 MG tablet TAKE 1 BY MOUTH DAILY  . aspirin 81 MG chewable tablet Chew 81 mg by mouth daily.  . Cholecalciferol (HM VITAMIN D3) 4000 UNITS CAPS Take 2,000 Units by mouth daily.   Marland Kitchen levothyroxine (LEVOTHROID) 25 MCG tablet Take 1 tablet (25 mcg total) by mouth daily before breakfast.  . levothyroxine  (SYNTHROID, LEVOTHROID) 150 MCG tablet TAKE 1 TABLET BY MOUTH EVERY DAY  . losartan (COZAAR) 100 MG tablet TAKE 1 BY MOUTH DAILY  . lovastatin (MEVACOR) 40 MG tablet TAKE 1 BY MOUTH AT BEDTIME  . Omega-3 Fatty Acids (FISH OIL) 1200 MG CAPS Take 1,200 mg by mouth daily.   No facility-administered encounter medications on file as of 02/13/2016.     Review of Systems  Constitutional: Positive for fever.  HENT: Positive for congestion and postnasal drip.   Eyes: Negative.   Respiratory: Positive for cough.   Cardiovascular: Negative.   Gastrointestinal: Negative.   Endocrine: Negative.   Genitourinary: Negative.   Musculoskeletal: Positive for arthralgias (left middle finger ).  Skin: Negative.   Allergic/Immunologic: Negative.   Neurological: Negative.   Hematological: Negative.   Psychiatric/Behavioral: Negative.        Objective:   Physical Exam  Constitutional: He is oriented to person, place, and time. He appears well-developed and well-nourished. No distress.  HENT:  Head: Normocephalic and atraumatic.  Right Ear: External ear normal.  Left Ear: External ear normal.  Mouth/Throat: Oropharynx is clear and moist. No oropharyngeal exudate.  Slight nasal congestion  Eyes: Conjunctivae and EOM are normal. Pupils are equal, round, and reactive to light. Right eye exhibits no discharge. Left eye exhibits no discharge. No scleral icterus.  Neck: Normal range of motion. Neck supple. No thyromegaly present.  No bruits or thyromegaly or adenopathy  Cardiovascular: Normal rate, regular rhythm, normal heart sounds and intact distal pulses.   No  murmur heard. Regular rate and rhythm at 72/m  Pulmonary/Chest: Effort normal and breath sounds normal. No respiratory distress. He has no wheezes. He has no rales. He exhibits no tenderness.  Lungs are clear front to back  Abdominal: Soft. Bowel sounds are normal. He exhibits no mass. There is no tenderness. There is no rebound and no guarding.   No abdominal tenderness liver or spleen enlargement or masses. No bruits.  Musculoskeletal: Normal range of motion. He exhibits no edema or tenderness.  Lymphadenopathy:    He has no cervical adenopathy.  Neurological: He is alert and oriented to person, place, and time. He has normal reflexes. No cranial nerve deficit.  Skin: Skin is warm and dry. No rash noted.  Psychiatric: He has a normal mood and affect. His behavior is normal. Judgment and thought content normal.  Nursing note and vitals reviewed.  BP 102/65 mmHg  Pulse 71  Temp(Src) 97.4 F (36.3 C) (Oral)  Ht 5' 11.25" (1.81 m)  Wt 257 lb (116.574 kg)  BMI 35.58 kg/m2        Assessment & Plan:  1. Hypothyroidism, unspecified hypothyroidism type -Continue current treatment pending results of lab work - CBC with Differential/Platelet - Thyroid Panel With TSH  2. Hyperlipidemia -Continue current treatment pending results of lab work - CBC with Differential/Platelet - NMR, lipoprofile  3. HYPERTENSION, CONTROLLED -The blood pressure slightly decreased today but he will continue with current treatment - BMP8+EGFR - CBC with Differential/Platelet - Hepatic function panel  4. BPH (benign prostatic hyperplasia) -No symptoms with voiding. - CBC with Differential/Platelet  5. Vitamin D deficiency -Continue vitamin D replacement pending results of lab work - CBC with Differential/Platelet - VITAMIN D 25 Hydroxy (Vit-D Deficiency, Fractures)  6. Special screening for malignant neoplasms, colon -Return FOBT - Fecal occult blood, imunochemical; Future  7. Finger pain, right - Ambulatory referral to Hand Surgery  8. Viral syndrome -Rest fluids Tylenol through the weekend and gradually increase activity and diet  Patient Instructions                       Medicare Annual Wellness Visit  [facility] and the medical providers at [facility] strive to bring you the best medical care.  In doing  so we not only want to address your current medical conditions and concerns but also to detect new conditions early and prevent illness, disease and health-related problems.    Medicare offers a yearly Wellness Visit which allows our clinical staff to assess your need for preventative services including immunizations, lifestyle education, counseling to decrease risk of preventable diseases and screening for fall risk and other medical concerns.    This visit is provided free of charge (no copay) for all Medicare recipients. The clinical pharmacists at [facility] have begun to conduct these Wellness Visits which will also include a thorough review of all your medications.    As you primary medical provider recommend that you make an appointment for your Annual Wellness Visit if you have not done so already this year.  You may set up this appointment before you leave today or you may call back [phone]) and schedule an appointment.  Please make sure when you call that you mention that you are scheduling your Annual Wellness Visit with the clinical pharmacist so that the appointment may be made for the proper length of time.     Continue current medications. Continue good therapeutic lifestyle changes which  include good diet and exercise. Fall precautions discussed with patient. If an FOBT was given today- please return it to our front desk. If you are over 40 years old - you may need Prevnar 53 or the adult Pneumonia vaccine.  **Flu shots are available--- please call and schedule a FLU-CLINIC appointment**  After your visit with [place] today you will receive a survey in the mail or online from [employer] regarding your care with [place]. Please take a moment to fill this out. Your feedback is very important to [place] as you can help [place] better understand your patient needs as well as improve your experience and satisfaction. WE CARE ABOUT YOU!!!   We will arrange a visit with Dr Daryll Brod for your finger The patient has had a recent viral syndrome and should continue to get plenty of rest and drink plenty of fluids through the weekend. He should use Mucinex as needed for cough and congestion and use nasal saline frequently in each nostril If the nasal congestion persists he could try some Flonase over-the-counter one to 2 sprays each nostril at bedtime    Arrie Senate MD

## 2016-02-13 NOTE — Patient Instructions (Addendum)
Medicare Annual Wellness Visit  [place] and the medical providers at [facility] strive to bring you the best medical care.  In doing so we not only want to address your current medical conditions and concerns but also to detect new conditions early and prevent illness, disease and health-related problems.    Medicare offers a yearly Wellness Visit which allows our clinical staff to assess your need for preventative services including immunizations, lifestyle education, counseling to decrease risk of preventable diseases and screening for fall risk and other medical concerns.    This visit is provided free of charge (no copay) for all Medicare recipients. The clinical pharmacists at [facility] have begun to conduct these Wellness Visits which will also include a thorough review of all your medications.    As you primary medical provider recommend that you make an appointment for your Annual Wellness Visit if you have not done so already this year.  You may set up this appointment before you leave today or you may call back [phone] and schedule an appointment.  Please make sure when you call that you mention that you are scheduling your Annual Wellness Visit with the clinical pharmacist so that the appointment may be made for the proper length of time.     Continue current medications. Continue good therapeutic lifestyle changes which include good diet and exercise. Fall precautions discussed with patient. If an FOBT was given today- please return it to our front desk. If you are over 73 years old - you may need Prevnar 13 or the adult Pneumonia vaccine.  **Flu shots are available--- please call and schedule a FLU-CLINIC appointment**  After your visit with us today you will receive a survey in the mail or online from [employer] regarding your care with us. Please take a moment to fill this out. Your feedback is very  important to us as you can help us better understand your patient needs as well as improve your experience and satisfaction. WE CARE ABOUT YOU!!!   We will arrange a visit with Dr Cindee SaltGary Kuzma for your finger The patient has had a recent viral syndrome and should continue to get plenty of rest and drink plenty of fluids through the weekend. He should use Mucinex as needed for cough and congestion and use nasal saline frequently in each nostril If the nasal congestion persists he could try some Flonase over-the-counter one to 2 sprays each nostril at bedtime

## 2016-02-14 LAB — BMP8+EGFR
BUN / CREAT RATIO: 12 (ref 10–24)
BUN: 18 mg/dL (ref 8–27)
CALCIUM: 9 mg/dL (ref 8.6–10.2)
CHLORIDE: 98 mmol/L (ref 96–106)
CO2: 25 mmol/L (ref 18–29)
Creatinine, Ser: 1.46 mg/dL — ABNORMAL HIGH (ref 0.76–1.27)
GFR, EST AFRICAN AMERICAN: 55 mL/min/{1.73_m2} — AB (ref >59)
GFR, EST NON AFRICAN AMERICAN: 47 mL/min/{1.73_m2} — AB (ref >59)
Glucose: 107 mg/dL — ABNORMAL HIGH (ref 65–99)
Potassium: 4.1 mmol/L (ref 3.5–5.2)
Sodium: 140 mmol/L (ref 134–144)

## 2016-02-14 LAB — CBC WITH DIFFERENTIAL/PLATELET
BASOS: 1 %
Basophils Absolute: 0.1 10*3/uL (ref 0.0–0.2)
EOS (ABSOLUTE): 0.3 10*3/uL (ref 0.0–0.4)
EOS: 5 %
HEMATOCRIT: 43.7 % (ref 37.5–51.0)
HEMOGLOBIN: 15 g/dL (ref 12.6–17.7)
IMMATURE GRANS (ABS): 0 10*3/uL (ref 0.0–0.1)
Immature Granulocytes: 0 %
LYMPHS ABS: 1.9 10*3/uL (ref 0.7–3.1)
LYMPHS: 27 %
MCH: 30.2 pg (ref 26.6–33.0)
MCHC: 34.3 g/dL (ref 31.5–35.7)
MCV: 88 fL (ref 79–97)
MONOCYTES: 14 %
Monocytes Absolute: 0.9 10*3/uL (ref 0.1–0.9)
NEUTROS ABS: 3.7 10*3/uL (ref 1.4–7.0)
Neutrophils: 53 %
Platelets: 297 10*3/uL (ref 150–379)
RBC: 4.96 x10E6/uL (ref 4.14–5.80)
RDW: 13.7 % (ref 12.3–15.4)
WBC: 6.9 10*3/uL (ref 3.4–10.8)

## 2016-02-14 LAB — HEPATIC FUNCTION PANEL
ALT: 29 IU/L (ref 0–44)
AST: 29 IU/L (ref 0–40)
Albumin: 4.3 g/dL (ref 3.5–4.8)
Alkaline Phosphatase: 61 IU/L (ref 39–117)
BILIRUBIN TOTAL: 0.5 mg/dL (ref 0.0–1.2)
Bilirubin, Direct: 0.18 mg/dL (ref 0.00–0.40)
Total Protein: 7.1 g/dL (ref 6.0–8.5)

## 2016-02-14 LAB — NMR, LIPOPROFILE
Cholesterol: 122 mg/dL (ref 100–199)
HDL CHOLESTEROL BY NMR: 42 mg/dL (ref >39)
HDL Particle Number: 24.2 umol/L — ABNORMAL LOW (ref >=30.5)
LDL PARTICLE NUMBER: 1066 nmol/L — AB (ref <1000)
LDL SIZE: 20.4 nm (ref >20.5)
LDL-C: 64 mg/dL (ref 0–99)
LP-IR Score: 47 — ABNORMAL HIGH (ref <=45)
SMALL LDL PARTICLE NUMBER: 602 nmol/L — AB (ref <=527)
Triglycerides by NMR: 79 mg/dL (ref 0–149)

## 2016-02-14 LAB — THYROID PANEL WITH TSH
FREE THYROXINE INDEX: 2.9 (ref 1.2–4.9)
T3 Uptake Ratio: 28 % (ref 24–39)
T4 TOTAL: 10.5 ug/dL (ref 4.5–12.0)
TSH: 12.54 u[IU]/mL — AB (ref 0.450–4.500)

## 2016-02-14 LAB — VITAMIN D 25 HYDROXY (VIT D DEFICIENCY, FRACTURES): Vit D, 25-Hydroxy: 33.8 ng/mL (ref 30.0–100.0)

## 2016-02-16 ENCOUNTER — Other Ambulatory Visit: Payer: Self-pay | Admitting: *Deleted

## 2016-02-16 DIAGNOSIS — E039 Hypothyroidism, unspecified: Secondary | ICD-10-CM

## 2016-02-18 DIAGNOSIS — M20022 Boutonniere deformity of left finger(s): Secondary | ICD-10-CM | POA: Diagnosis not present

## 2016-02-18 DIAGNOSIS — M65332 Trigger finger, left middle finger: Secondary | ICD-10-CM | POA: Diagnosis not present

## 2016-02-26 DIAGNOSIS — M65332 Trigger finger, left middle finger: Secondary | ICD-10-CM | POA: Diagnosis not present

## 2016-03-15 DIAGNOSIS — M65332 Trigger finger, left middle finger: Secondary | ICD-10-CM | POA: Diagnosis not present

## 2016-03-15 DIAGNOSIS — S6390XA Sprain of unspecified part of unspecified wrist and hand, initial encounter: Secondary | ICD-10-CM | POA: Diagnosis not present

## 2016-03-15 DIAGNOSIS — M20022 Boutonniere deformity of left finger(s): Secondary | ICD-10-CM | POA: Diagnosis not present

## 2016-03-18 DIAGNOSIS — M25642 Stiffness of left hand, not elsewhere classified: Secondary | ICD-10-CM | POA: Diagnosis not present

## 2016-03-18 DIAGNOSIS — M65332 Trigger finger, left middle finger: Secondary | ICD-10-CM | POA: Diagnosis not present

## 2016-04-14 ENCOUNTER — Other Ambulatory Visit: Payer: Self-pay | Admitting: *Deleted

## 2016-04-14 MED ORDER — LOVASTATIN 40 MG PO TABS: ORAL_TABLET | ORAL | Status: DC

## 2016-04-14 MED ORDER — LOSARTAN POTASSIUM 100 MG PO TABS: ORAL_TABLET | ORAL | Status: DC

## 2016-04-22 DIAGNOSIS — M65332 Trigger finger, left middle finger: Secondary | ICD-10-CM | POA: Diagnosis not present

## 2016-04-22 DIAGNOSIS — M25642 Stiffness of left hand, not elsewhere classified: Secondary | ICD-10-CM | POA: Diagnosis not present

## 2016-04-28 DIAGNOSIS — M20022 Boutonniere deformity of left finger(s): Secondary | ICD-10-CM | POA: Diagnosis not present

## 2016-04-28 DIAGNOSIS — S6390XA Sprain of unspecified part of unspecified wrist and hand, initial encounter: Secondary | ICD-10-CM | POA: Diagnosis not present

## 2016-04-28 DIAGNOSIS — M65332 Trigger finger, left middle finger: Secondary | ICD-10-CM | POA: Diagnosis not present

## 2016-05-20 ENCOUNTER — Other Ambulatory Visit: Payer: Self-pay | Admitting: Family Medicine

## 2016-05-20 MED ORDER — AMLODIPINE BESYLATE 10 MG PO TABS: ORAL_TABLET | ORAL | 0 refills | Status: DC

## 2016-05-20 MED ORDER — LEVOTHYROXINE SODIUM 25 MCG PO TABS: 25.0000 ug | ORAL_TABLET | Freq: Every day | ORAL | 0 refills | Status: DC

## 2016-05-20 NOTE — Telephone Encounter (Signed)
Patient aware that we have not received any request from [facility] and rx sent to pharmacy.

## 2016-05-21 DIAGNOSIS — M65332 Trigger finger, left middle finger: Secondary | ICD-10-CM | POA: Diagnosis not present

## 2016-05-21 DIAGNOSIS — M25642 Stiffness of left hand, not elsewhere classified: Secondary | ICD-10-CM | POA: Diagnosis not present

## 2016-06-09 ENCOUNTER — Other Ambulatory Visit: Payer: Self-pay | Admitting: Orthopedic Surgery

## 2016-06-09 DIAGNOSIS — M20022 Boutonniere deformity of left finger(s): Secondary | ICD-10-CM | POA: Diagnosis not present

## 2016-06-09 DIAGNOSIS — S6390XA Sprain of unspecified part of unspecified wrist and hand, initial encounter: Secondary | ICD-10-CM | POA: Diagnosis not present

## 2016-06-30 ENCOUNTER — Encounter (HOSPITAL_BASED_OUTPATIENT_CLINIC_OR_DEPARTMENT_OTHER): Payer: Self-pay | Admitting: *Deleted

## 2016-06-30 NOTE — Progress Notes (Signed)
   06/30/16 1302  OBSTRUCTIVE SLEEP APNEA  Have you ever been diagnosed with sleep apnea through a sleep study? No  Do you snore loudly (loud enough to be heard through closed doors)?  0  Do you often feel tired, fatigued, or sleepy during the daytime (such as falling asleep during driving or talking to someone)? 0  Has anyone observed you stop breathing during your sleep? 0  Do you have, or are you being treated for high blood pressure? 1  BMI more than 35 kg/m2? 1  Age > 50 (1-yes) 1  Male Gender (Yes=1) 1  Obstructive Sleep Apnea Score 4

## 2016-07-06 ENCOUNTER — Encounter (HOSPITAL_BASED_OUTPATIENT_CLINIC_OR_DEPARTMENT_OTHER)
Admission: RE | Disposition: A | Discharge status: Home / Self Care | Payer: Self-pay | Source: Ambulatory Visit | Attending: Orthopedic Surgery

## 2016-07-06 ENCOUNTER — Ambulatory Visit (HOSPITAL_BASED_OUTPATIENT_CLINIC_OR_DEPARTMENT_OTHER)
Admission: RE | Admit: 2016-07-06 | Discharge: 2016-07-06 | Disposition: A | Discharge status: Home / Self Care | Payer: Medicare Other | Source: Ambulatory Visit | Attending: Orthopedic Surgery | Admitting: Orthopedic Surgery

## 2016-07-06 ENCOUNTER — Encounter (HOSPITAL_BASED_OUTPATIENT_CLINIC_OR_DEPARTMENT_OTHER): Payer: Self-pay | Admitting: Certified Registered"

## 2016-07-06 ENCOUNTER — Ambulatory Visit (HOSPITAL_BASED_OUTPATIENT_CLINIC_OR_DEPARTMENT_OTHER): Payer: Medicare Other | Admitting: Certified Registered"

## 2016-07-06 DIAGNOSIS — N189 Chronic kidney disease, unspecified: Secondary | ICD-10-CM | POA: Insufficient documentation

## 2016-07-06 DIAGNOSIS — E039 Hypothyroidism, unspecified: Secondary | ICD-10-CM | POA: Insufficient documentation

## 2016-07-06 DIAGNOSIS — M24542 Contracture, left hand: Secondary | ICD-10-CM | POA: Diagnosis not present

## 2016-07-06 DIAGNOSIS — Z87891 Personal history of nicotine dependence: Secondary | ICD-10-CM | POA: Diagnosis not present

## 2016-07-06 DIAGNOSIS — I129 Hypertensive chronic kidney disease with stage 1 through stage 4 chronic kidney disease, or unspecified chronic kidney disease: Secondary | ICD-10-CM | POA: Insufficient documentation

## 2016-07-06 DIAGNOSIS — N400 Enlarged prostate without lower urinary tract symptoms: Secondary | ICD-10-CM | POA: Diagnosis not present

## 2016-07-06 DIAGNOSIS — E669 Obesity, unspecified: Secondary | ICD-10-CM | POA: Insufficient documentation

## 2016-07-06 DIAGNOSIS — Z6835 Body mass index (BMI) 35.0-35.9, adult: Secondary | ICD-10-CM | POA: Insufficient documentation

## 2016-07-06 DIAGNOSIS — M20022 Boutonniere deformity of left finger(s): Secondary | ICD-10-CM | POA: Diagnosis not present

## 2016-07-06 DIAGNOSIS — M20092 Other deformity of left finger(s): Secondary | ICD-10-CM | POA: Diagnosis not present

## 2016-07-06 HISTORY — PX: DUPUYTREN CONTRACTURE RELEASE: SHX1478

## 2016-07-06 HISTORY — DX: Hypothyroidism, unspecified: E03.9

## 2016-07-06 SURGERY — RELEASE, DUPUYTREN CONTRACTURE
Anesthesia: Monitor Anesthesia Care | Site: Finger | Laterality: Left

## 2016-07-06 MED ORDER — GLYCOPYRROLATE 0.2 MG/ML IJ SOLN: 0.2000 mg | Freq: Once | INTRAMUSCULAR | Status: DC | PRN

## 2016-07-06 MED ORDER — HYDROCODONE-ACETAMINOPHEN 5-325 MG PO TABS: 1.0000 | ORAL_TABLET | Freq: Four times a day (QID) | ORAL | 0 refills | Status: DC | PRN

## 2016-07-06 MED ORDER — ONDANSETRON HCL 4 MG/2ML IJ SOLN
INTRAMUSCULAR | Status: AC
  Filled 2016-07-06: qty 2

## 2016-07-06 MED ORDER — MIDAZOLAM HCL 2 MG/2ML IJ SOLN
INTRAMUSCULAR | Status: AC
  Filled 2016-07-06: qty 2

## 2016-07-06 MED ORDER — FENTANYL CITRATE (PF) 100 MCG/2ML IJ SOLN
50.0000 ug | INTRAMUSCULAR | Status: DC | PRN
  Administered 2016-07-06: 50 ug via INTRAVENOUS

## 2016-07-06 MED ORDER — LIDOCAINE HCL (CARDIAC) 20 MG/ML IV SOLN
INTRAVENOUS | Status: DC | PRN
  Administered 2016-07-06: 30 mg via INTRAVENOUS

## 2016-07-06 MED ORDER — LACTATED RINGERS IV SOLN
INTRAVENOUS | Status: DC
  Administered 2016-07-06: 10:00:00 via INTRAVENOUS

## 2016-07-06 MED ORDER — FENTANYL CITRATE (PF) 100 MCG/2ML IJ SOLN
INTRAMUSCULAR | Status: AC
  Filled 2016-07-06: qty 2

## 2016-07-06 MED ORDER — PROPOFOL 500 MG/50ML IV EMUL
INTRAVENOUS | Status: DC | PRN
  Administered 2016-07-06: 50 ug/kg/min via INTRAVENOUS

## 2016-07-06 MED ORDER — ONDANSETRON HCL 4 MG/2ML IJ SOLN
INTRAMUSCULAR | Status: AC
Start: 2016-07-06 — End: 2016-07-06
  Filled 2016-07-06: qty 2

## 2016-07-06 MED ORDER — SCOPOLAMINE 1 MG/3DAYS TD PT72: 1.0000 | MEDICATED_PATCH | Freq: Once | TRANSDERMAL | Status: DC | PRN

## 2016-07-06 MED ORDER — ONDANSETRON HCL 4 MG/2ML IJ SOLN
INTRAMUSCULAR | Status: DC | PRN
  Administered 2016-07-06: 4 mg via INTRAVENOUS

## 2016-07-06 MED ORDER — CHLORHEXIDINE GLUCONATE 4 % EX LIQD: 60.0000 mL | Freq: Once | CUTANEOUS | Status: DC

## 2016-07-06 MED ORDER — LIDOCAINE HCL (PF) 0.5 % IJ SOLN
INTRAMUSCULAR | Status: DC | PRN
  Administered 2016-07-06: 30 mL via INTRAVENOUS

## 2016-07-06 MED ORDER — CEFAZOLIN SODIUM-DEXTROSE 2-4 GM/100ML-% IV SOLN
INTRAVENOUS | Status: AC
  Filled 2016-07-06: qty 100

## 2016-07-06 MED ORDER — MIDAZOLAM HCL 2 MG/2ML IJ SOLN
1.0000 mg | INTRAMUSCULAR | Status: DC | PRN
  Administered 2016-07-06: 1 mg via INTRAVENOUS

## 2016-07-06 MED ORDER — BUPIVACAINE HCL (PF) 0.25 % IJ SOLN
INTRAMUSCULAR | Status: DC | PRN
  Administered 2016-07-06: 9 mL

## 2016-07-06 MED ORDER — LIDOCAINE 2% (20 MG/ML) 5 ML SYRINGE
INTRAMUSCULAR | Status: AC
  Filled 2016-07-06: qty 5

## 2016-07-06 MED ORDER — CEFAZOLIN SODIUM-DEXTROSE 2-4 GM/100ML-% IV SOLN
2.0000 g | INTRAVENOUS | Status: AC
  Administered 2016-07-06: 2 g via INTRAVENOUS

## 2016-07-06 MED ORDER — DEXAMETHASONE SODIUM PHOSPHATE 10 MG/ML IJ SOLN
INTRAMUSCULAR | Status: AC
  Filled 2016-07-06: qty 1

## 2016-07-06 SURGICAL SUPPLY — 42 items
BLADE MINI RND TIP GREEN BEAV (BLADE) IMPLANT
BLADE SURG 15 STRL LF DISP TIS (BLADE) IMPLANT
BLADE SURG 15 STRL SS (BLADE)
BNDG CMPR 9X4 STRL LF SNTH (GAUZE/BANDAGES/DRESSINGS) ×1
BNDG COHESIVE 1X5 TAN STRL LF (GAUZE/BANDAGES/DRESSINGS) ×2 IMPLANT
BNDG ESMARK 4X9 LF (GAUZE/BANDAGES/DRESSINGS) ×3 IMPLANT
CHLORAPREP W/TINT 26ML (MISCELLANEOUS) ×3 IMPLANT
CORDS BIPOLAR (ELECTRODE) IMPLANT
COVER BACK TABLE 60X90IN (DRAPES) ×3 IMPLANT
COVER MAYO STAND STRL (DRAPES) ×3 IMPLANT
CUFF TOURNIQUET SINGLE 18IN (TOURNIQUET CUFF) ×2 IMPLANT
DECANTER SPIKE VIAL GLASS SM (MISCELLANEOUS) IMPLANT
DIGIT WIDGET (MISCELLANEOUS) ×2 IMPLANT
DRAPE EXTREMITY T 121X128X90 (DRAPE) ×3 IMPLANT
DRAPE OEC MINIVIEW 54X84 (DRAPES) ×2 IMPLANT
DRAPE SURG 17X23 STRL (DRAPES) ×3 IMPLANT
GAUZE SPONGE 4X4 12PLY STRL (GAUZE/BANDAGES/DRESSINGS) ×1 IMPLANT
GAUZE XEROFORM 1X8 LF (GAUZE/BANDAGES/DRESSINGS) ×3 IMPLANT
GLOVE BIO SURGEON STRL SZ 6.5 (GLOVE) ×1 IMPLANT
GLOVE BIO SURGEONS STRL SZ 6.5 (GLOVE) ×1
GLOVE BIOGEL PI IND STRL 7.0 (GLOVE) IMPLANT
GLOVE BIOGEL PI IND STRL 8.5 (GLOVE) ×1 IMPLANT
GLOVE BIOGEL PI INDICATOR 7.0 (GLOVE) ×4
GLOVE BIOGEL PI INDICATOR 8.5 (GLOVE) ×2
GLOVE SURG ORTHO 8.0 STRL STRW (GLOVE) ×3 IMPLANT
GOWN STRL REUS W/ TWL LRG LVL3 (GOWN DISPOSABLE) ×1 IMPLANT
GOWN STRL REUS W/TWL LRG LVL3 (GOWN DISPOSABLE) ×3
GOWN STRL REUS W/TWL XL LVL3 (GOWN DISPOSABLE) ×3 IMPLANT
NS IRRIG 1000ML POUR BTL (IV SOLUTION) ×1 IMPLANT
PACK BASIN DAY SURGERY FS (CUSTOM PROCEDURE TRAY) ×3 IMPLANT
PADDING CAST ABS 4INX4YD NS (CAST SUPPLIES)
PADDING CAST ABS COTTON 4X4 ST (CAST SUPPLIES) ×1 IMPLANT
SHEET MEDIUM DRAPE 40X70 STRL (DRAPES) ×2 IMPLANT
SLEEVE SCD COMPRESS KNEE MED (MISCELLANEOUS) ×1 IMPLANT
SPONGE GAUZE 2X2 8PLY STER LF (GAUZE/BANDAGES/DRESSINGS) ×1
SPONGE GAUZE 2X2 8PLY STRL LF (GAUZE/BANDAGES/DRESSINGS) ×1 IMPLANT
STOCKINETTE 4X48 STRL (DRAPES) ×3 IMPLANT
SUT ETHILON 4 0 PS 2 18 (SUTURE) IMPLANT
SYR BULB 3OZ (MISCELLANEOUS) ×1 IMPLANT
SYR CONTROL 10ML LL (SYRINGE) ×2 IMPLANT
TOWEL OR 17X24 6PK STRL BLUE (TOWEL DISPOSABLE) ×4 IMPLANT
UNDERPAD 30X30 (UNDERPADS AND DIAPERS) ×1 IMPLANT

## 2016-07-06 NOTE — Anesthesia Preprocedure Evaluation (Addendum)
Anesthesia Evaluation  Patient identified by MRN, date of birth, ID band Patient awake    Reviewed: Allergy & Precautions, NPO status , Patient's Chart, lab work & pertinent test results  Airway Mallampati: II  TM Distance: >3 FB Neck ROM: Full    Dental  (+) Teeth Intact, Dental Advisory Given   Pulmonary neg pulmonary ROS, former smoker,    Pulmonary exam normal breath sounds clear to auscultation       Cardiovascular hypertension, Pt. on medications Normal cardiovascular exam Rhythm:Regular Rate:Normal  HLD   Neuro/Psych negative neurological ROS     GI/Hepatic negative GI ROS, Neg liver ROS,   Endo/Other  Hypothyroidism Obesity   Renal/GU Renal InsufficiencyRenal disease     Musculoskeletal negative musculoskeletal ROS (+)   Abdominal   Peds  Hematology negative hematology ROS (+)   Anesthesia Other Findings Day of surgery medications reviewed with the patient.  Reproductive/Obstetrics                            Anesthesia Physical Anesthesia Plan  ASA: II  Anesthesia Plan: MAC and Bier Block   Post-op Pain Management:    Induction: Intravenous  Airway Management Planned: Nasal Cannula  Additional Equipment:   Intra-op Plan:   Post-operative Plan:   Informed Consent: I have reviewed the patients History and Physical, chart, labs and discussed the procedure including the risks, benefits and alternatives for the proposed anesthesia with the patient or authorized representative who has indicated his/her understanding and acceptance.   Dental advisory given  Plan Discussed with:   Anesthesia Plan Comments: (Risks/benefits of regional block discussed with patient including risk of bleeding, infection, nerve damage, and possibility of failed block.  Also discussed backup plan of general anesthesia and associated risks.  Patient wishes to proceed.)       Anesthesia Quick  Evaluation

## 2016-07-06 NOTE — Brief Op Note (Signed)
07/06/2016  12:21 PM  PATIENT:  Andreas OhmJames R Hijazi  75 y.o. male  PRE-OPERATIVE DIAGNOSIS:  CONTRACTURE PROXIMAL INTERPHALANGEAL LEFT MIDDLE FINGER  POST-OPERATIVE DIAGNOSIS:  CONTRACTURE PROXIMAL INTERPHALANGEAL LEFT MIDDLE FINGER  PROCEDURE:  Procedure(s): APPLICATION DIGIT WIDGET LET MIDDLE FINGER (Left)  SURGEON:  Surgeon(s) and Role:    * Cindee SaltGary Megyn Leng, MD - Primary  PHYSICIAN ASSISTANT:   ASSISTANTS: none   ANESTHESIA:   local and regional  EBL:  Total I/O In: 600 [I.V.:600] Out: -   BLOOD ADMINISTERED:none  DRAINS: none   LOCAL MEDICATIONS USED:  BUPIVICAINE   SPECIMEN:  No Specimen  DISPOSITION OF SPECIMEN:  N/A  COUNTS:  YES  TOURNIQUET:   Total Tourniquet Time Documented: Upper Arm (Left) - 32 minutes Total: Upper Arm (Left) - 32 minutes   DICTATION: .Other Dictation: Dictation Number dictated 07/06/16 @12 :26  PLAN OF CARE: Discharge to home after PACU  PATIENT DISPOSITION:  PACU - hemodynamically stable.

## 2016-07-06 NOTE — Discharge Instructions (Signed)

## 2016-07-06 NOTE — Op Note (Signed)
dictated 07/06/16 @12 :26

## 2016-07-06 NOTE — H&P (Signed)
[name] is an 75 y.o. male.   Chief Complaint: contracture left middle finger HPI: Mr. Youtsey is a 75 year old right hand dominant male referred by Dr. Rudi Heap for consultation with respect to contracture that he has to his left middle finger. This occurred in August of 2016 when he was starting a chainsaw. He had a kick back when he was trying to start it with the rope pulling his finger into extension. This occurred in [place], he went to the Emergency Room there, and they said that he had ruptured the tendon. They placed him in a splint, which he maintained then begin him on therapy. He has had a gradual flexion deformity occur to the left middle finger at the PIP joint, they gave him an LMB splint to wear and sent him to therapy. He states that it did not improve his ability to use or move his finger. He has been using heat; he has no prior history of injuries. He is not complaining of pain, only if he tries to forcibly extend it. He has a history of thyroid problems, no history of diabetes, arthritis or gout. There is family history of diabetes.He has hadserial cast placed. States that has not changed recently with the removal of his cast. He does have a rupture of the flexor sheath positive on MRI.       Past Medical History:  Diagnosis Date  . Hypertension  . Thyroid disease   Past Surgical History:  Procedure Laterality Date  . APPENDECTOMY  . HERNIA REPAIR       Past Medical History:  Diagnosis Date  . BPH (benign prostatic hypertrophy)   . CKD (chronic kidney disease)   . Essential hypertension, benign   . Hypothyroidism   . Right inguinal hernia   . Thyroid disease     Past Surgical History:  Procedure Laterality Date  . APPENDECTOMY    . HERNIA REPAIR     abd    Family History  Problem Relation Age of Onset  . Parkinson's disease Mother   . Stroke Father    Social History:  reports that he quit smoking about 23 years ago. He has a 21.00 pack-year  smoking history. He has never used smokeless tobacco. He reports that he drinks alcohol. He reports that he does not use drugs.  Allergies:  Allergies  Allergen Reactions  . Paregoric     No prescriptions prior to admission.    No results found for this or any previous visit (from the past 48 hour(s)).  No results found.   Pertinent items are noted in HPI.  Height 6' (1.829 m), weight 117.9 kg (260 lb).  General appearance: alert, cooperative and appears stated age Head: Normocephalic, without obvious abnormality Neck: no JVD Resp: clear to auscultation bilaterally Cardio: regular rate and rhythm, S1, S2 normal, no murmur, click, rub or gallop GI: soft, non-tender; bowel sounds normal; no masses,  no organomegaly Extremities: flexion contraacture left middle finger Pulses: 2+ and symmetric Skin: Skin color, texture, turgor normal. 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 Rupture of flexor sheath pulley of finger   Plan: We have discussed possibility of application of digit widget with him. He would like to proceed to have this done. He is aware that there is no guarantee with the surgery the possibility of infection recurrence injury to arteries nerves tendons incomplete release symptoms dystrophy the  possibility of fracture of the middle phalanx. He is aware that a second procedure will need to be done to correct the causation of the problem with the reconstruction of the flexor pulley system. Questions are encouraged and answered to his satisfaction isscheduled as an outpatient under regional anesthesia        Ahaana Rochette R 07/06/2016, 3:54 AM

## 2016-07-06 NOTE — Transfer of Care (Signed)
Immediate Anesthesia Transfer of Care Note  Patient: [name]  Procedure(s) Performed: Procedure(s): APPLICATION DIGIT WIDGET LET MIDDLE FINGER (Left)  Patient Location: PACU  Anesthesia Type:Bier block  Level of Consciousness: awake, alert , oriented and patient cooperative  Airway & Oxygen Therapy: Patient Spontanous Breathing and Patient connected to face mask oxygen  Post-op Assessment: Report given to RN and Post -op Vital signs reviewed and stable  Post vital signs: Reviewed and stable  Last Vitals:  Vitals:   07/06/16 0953  BP: 121/74  Pulse: 72  Resp: 20  Temp: 36.7 C    Last Pain:  Vitals:   07/06/16 0953  TempSrc: Oral         Complications: No apparent anesthesia complications

## 2016-07-06 NOTE — Anesthesia Procedure Notes (Signed)
Procedure Name: MAC Date/Time: 07/06/2016 11:59 AM Performed by: Melton Walls D Pre-anesthesia Checklist: Patient identified, Emergency Drugs available, Suction available, Patient being monitored and Timeout performed Patient Re-evaluated:Patient Re-evaluated prior to inductionOxygen Delivery Method: Simple face mask

## 2016-07-06 NOTE — Anesthesia Procedure Notes (Signed)
Anesthesia Regional Block:  Bier block (IV Regional)  Pre-Anesthetic Checklist: ,, timeout performed, Correct Patient, Correct Site, Correct Laterality, Correct Procedure,, site marked, surgical consent,, at surgeon's request Needles:  Injection technique: Single-shot  Needle Type: Other      Needle Gauge: 20 and 20 G    Additional Needles: Bier block (IV Regional) Narrative:   Performed by: Personally       

## 2016-07-06 NOTE — Anesthesia Postprocedure Evaluation (Signed)
Anesthesia Post Note  Patient: [name]  Procedure(s) Performed: Procedure(s) (LRB): APPLICATION DIGIT WIDGET LET MIDDLE FINGER (Left)  Patient location during evaluation: PACU Anesthesia Type: MAC and Bier Block Level of consciousness: awake and alert Pain management: pain level controlled Vital Signs Assessment: post-procedure vital signs reviewed and stable Respiratory status: spontaneous breathing, nonlabored ventilation, respiratory function stable and patient connected to nasal cannula oxygen Cardiovascular status: stable and blood pressure returned to baseline Anesthetic complications: no    Last Vitals:  Vitals:   07/06/16 1300 07/06/16 1340  BP: (!) 144/79 (!) 148/76  Pulse: (!) 59 (!) 58  Resp: 15 16  Temp:  36.7 C    Last Pain:  Vitals:   07/06/16 1340  TempSrc:   PainSc: 0-No pain                 Cecile HearingStephen Edward Turk

## 2016-07-07 ENCOUNTER — Encounter (HOSPITAL_BASED_OUTPATIENT_CLINIC_OR_DEPARTMENT_OTHER): Payer: Self-pay | Admitting: Orthopedic Surgery

## 2016-07-07 NOTE — Op Note (Signed)
NAMEKENTO, GOSSMAN NO.:  [id number]  MEDICAL RECORD NO.:  [id number]  LOCATION:                                 FACILITY:  PHYSICIAN:  Cindee Salt, M.D.            DATE OF BIRTH:  DATE OF PROCEDURE:  07/06/2016 DATE OF DISCHARGE:                              OPERATIVE REPORT   PREOPERATIVE DIAGNOSIS:  Flexion contracture proximal interphalangeal joint, left middle finger.  POSTOPERATIVE DIAGNOSIS:  Flexion contracture proximal interphalangeal joint, left middle finger.  OPERATION:  Application of widget, left middle finger.  SURGEON:  Cindee Salt, MD.  ANESTHESIA:  Forearm-based IV regional with local infiltration.  ANESTHESIOLOGIST:  Dr. Desmond Lope.  PLACE OF SURGERY:  [facility].  HISTORY:  The patient is a 75 year old male with a history of a flexion deformity boutonniere type to the left middle finger.  He has attempted to undergo conservative treatment splinting for this without relief.  He is admitted now for application of a digit widget for release of the flexion deformity.  Pre, peri and postoperative course have been discussed along with risks and complications.  He is aware that there is no guarantee with the surgery, the possibility of infection, recurrence of injury to arteries, nerves, tendons, incomplete relief of symptoms and dystrophy, the possibility of fracture to the phalanx.  In the preoperative area, the patient is seen, the extremity marked by both the patient and surgeon and antibiotic given.  PROCEDURE IN DETAIL:  The patient was brought to the operating room, where a forearm-based IV regional anesthetic was carried out without difficulty under the direction of Dr. Desmond Lope.  He was prepped using ChloraPrep in supine position with the left arm free.  A 3-minute dry time was allowed.  Time-out taken, confirming the patient and procedure. The guide for the application of a digit widget pins was then applied. X-rays  confirmed its position.  This was marked.  A proximal pin was placed.  This was found to be slightly distal and was removed.  This was readjusted in that the guide had slipped and re-applied.  The pin was re- inserted in a proper position.  This was confirmed with image intensification.  A second pin distally was then placed.  These were confirmed with image intensification.  The distal pin was then removed and a screwed pin was placed and screwed in under image intensification to just penetrate the volar cortex of the middle phalanx.  The more proximal pin was then removed and the screwed pin was then inserted and under image intensification advanced through the volar cortex.  The pins were then cut.  The nonadherent gauze was placed and wrapped with Coban. The device was then applied cutting the pins shorter to allow full extension.  Two light rubber bands were applied.  On deflation of the tourniquet, all fingers immediately pinked.  He was taken to the recovery room for observation in satisfactory condition.  A metacarpal block was given at the beginning of the procedure with 0.25% bupivacaine without epinephrine, approximately 8 mL was used.  The patient tolerated the procedure well.  He will be discharged  home to return in 1 week, on Norco.          ______________________________ Cindee SaltGary Nyja Westbrook, M.D.     GK/MEDQ  D:  07/06/2016  T:  07/06/2016  Job:  [id number]

## 2016-07-12 DIAGNOSIS — Z471 Aftercare following joint replacement surgery: Secondary | ICD-10-CM | POA: Diagnosis not present

## 2016-07-12 DIAGNOSIS — M20022 Boutonniere deformity of left finger(s): Secondary | ICD-10-CM | POA: Diagnosis not present

## 2016-07-12 DIAGNOSIS — S6390XD Sprain of unspecified part of unspecified wrist and hand, subsequent encounter: Secondary | ICD-10-CM | POA: Diagnosis not present

## 2016-07-22 ENCOUNTER — Other Ambulatory Visit: Payer: Self-pay | Admitting: *Deleted

## 2016-07-22 MED ORDER — LEVOTHYROXINE SODIUM 150 MCG PO TABS: 150.0000 ug | ORAL_TABLET | Freq: Every day | ORAL | 2 refills | Status: DC

## 2016-08-12 ENCOUNTER — Other Ambulatory Visit: Payer: Self-pay | Admitting: *Deleted

## 2016-08-12 MED ORDER — LEVOTHYROXINE SODIUM 25 MCG PO TABS: 25.0000 ug | ORAL_TABLET | Freq: Every day | ORAL | 1 refills | Status: DC

## 2016-08-16 ENCOUNTER — Encounter: Payer: Self-pay | Admitting: Family Medicine

## 2016-08-16 ENCOUNTER — Ambulatory Visit (INDEPENDENT_AMBULATORY_CARE_PROVIDER_SITE_OTHER): Payer: Medicare Other | Admitting: Family Medicine

## 2016-08-16 VITALS — BP 113/69 | HR 70 | Temp 97.7°F | Ht 72.0 in | Wt 263.0 lb

## 2016-08-16 DIAGNOSIS — N400 Enlarged prostate without lower urinary tract symptoms: Secondary | ICD-10-CM

## 2016-08-16 DIAGNOSIS — E7800 Pure hypercholesterolemia, unspecified: Secondary | ICD-10-CM | POA: Diagnosis not present

## 2016-08-16 DIAGNOSIS — M20022 Boutonniere deformity of left finger(s): Secondary | ICD-10-CM | POA: Diagnosis not present

## 2016-08-16 DIAGNOSIS — E034 Atrophy of thyroid (acquired): Secondary | ICD-10-CM

## 2016-08-16 DIAGNOSIS — Z1211 Encounter for screening for malignant neoplasm of colon: Secondary | ICD-10-CM | POA: Diagnosis not present

## 2016-08-16 DIAGNOSIS — I10 Essential (primary) hypertension: Secondary | ICD-10-CM | POA: Diagnosis not present

## 2016-08-16 DIAGNOSIS — S6390XA Sprain of unspecified part of unspecified wrist and hand, initial encounter: Secondary | ICD-10-CM | POA: Diagnosis not present

## 2016-08-16 DIAGNOSIS — E559 Vitamin D deficiency, unspecified: Secondary | ICD-10-CM

## 2016-08-16 DIAGNOSIS — N183 Chronic kidney disease, stage 3 unspecified: Secondary | ICD-10-CM

## 2016-08-16 DIAGNOSIS — E039 Hypothyroidism, unspecified: Secondary | ICD-10-CM

## 2016-08-16 DIAGNOSIS — Z23 Encounter for immunization: Secondary | ICD-10-CM | POA: Diagnosis not present

## 2016-08-16 LAB — URINALYSIS, COMPLETE
BILIRUBIN UA: NEGATIVE
GLUCOSE, UA: NEGATIVE
Leukocytes, UA: NEGATIVE
NITRITE UA: NEGATIVE
Specific Gravity, UA: 1.025 (ref 1.005–1.030)
UUROB: 0.2 mg/dL (ref 0.2–1.0)
pH, UA: 6 (ref 5.0–7.5)

## 2016-08-16 LAB — MICROSCOPIC EXAMINATION: Bacteria, UA: NONE SEEN

## 2016-08-16 NOTE — Progress Notes (Signed)
Subjective:    [name], male    DOB: Feb 23, 1941, 75 y.o.   MRN: [id number]  HPI Pt here for follow up and management of chronic medical problems which includes hypothyroid, hyperlipidemia and hypertension. He is taking medications regularly.Patient is doing well overall. He is followed by the hand surgeon because of ongoing problems with a trigger finger. He has an appointment with the surgeon today. Overall he is doing well. He denies any chest pain shortness of breath or palpitations. He denies any problems with swallowing heartburn indigestion nausea vomiting diarrhea or blood in the stool. He does have some constipation and is currently taking Benefiber and stool softeners for this. In his water without problems no burning pain frequency or blood in the urine. He will get lab work today and get his flu shot and a urinalysis today. We reminded him that his weight is creeping up and actually puts him in the morbid obesity category because he has 2 or more comorbidities conditions with a BMI greater than 35. He mentions that we were asked for him to come back to repeat his thyroid profile before and he did not as his TSH was elevated previously. The TSH 6 months ago was 12.540. He is currently taking 150 g of levothyroxine and in taking one half of a 25 on 4 days a week and 25 on 3 days a week.     Patient Active Problem List   Diagnosis Date Noted  . Metabolic syndrome 36/62/9476  . Hyperlipidemia 01/31/2013  . Hypothyroid 01/31/2013  . BPH (benign prostatic hyperplasia) 01/31/2013  . CKD (chronic kidney disease) 01/31/2013  . HYPERTENSION, CONTROLLED 02/03/2010   Outpatient Encounter Prescriptions as of 08/16/2016  Medication Sig  . amLODipine (NORVASC) 10 MG tablet TAKE 1 BY MOUTH DAILY  . aspirin 81 MG chewable tablet Chew 81 mg by mouth daily.  . Cholecalciferol (HM VITAMIN D3) 4000 UNITS CAPS Take 2,000 Units by mouth daily.   Marland Kitchen levothyroxine (LEVOTHROID) 25 MCG  tablet Take 1 tablet (25 mcg total) by mouth daily before breakfast.  . levothyroxine (SYNTHROID, LEVOTHROID) 150 MCG tablet Take 1 tablet (150 mcg total) by mouth daily.  Marland Kitchen losartan (COZAAR) 100 MG tablet TAKE 1 BY MOUTH DAILY  . lovastatin (MEVACOR) 40 MG tablet TAKE 1 BY MOUTH AT BEDTIME  . Omega-3 Fatty Acids (FISH OIL) 1200 MG CAPS Take 1,200 mg by mouth daily.  . [DISCONTINUED] HYDROcodone-acetaminophen (NORCO) 5-325 MG tablet Take 1 tablet by mouth every 6 (six) hours as needed for moderate pain.   No facility-administered encounter medications on file as of 08/16/2016.      Review of Systems  Constitutional: Negative.   HENT: Negative.   Eyes: Negative.   Respiratory: Negative.   Cardiovascular: Negative.   Gastrointestinal: Negative.   Endocrine: Negative.   Genitourinary: Negative.   Musculoskeletal: Negative.   Skin: Negative.   Allergic/Immunologic: Negative.   Neurological: Negative.   Hematological: Negative.   Psychiatric/Behavioral: Negative.            Objective:   Physical Exam  Constitutional: He is oriented to person, place, and time. He appears well-developed and well-nourished.  HENT:  Head: Normocephalic and atraumatic.  Right Ear: External ear normal.  Left Ear: External ear normal.  Nose: Nose normal.  Mouth/Throat: Oropharynx is clear and moist. No oropharyngeal exudate.  Eyes: Conjunctivae and EOM are normal. Pupils are equal, round, and reactive to light. Right eye exhibits no discharge. Left eye exhibits no  discharge. No scleral icterus.  Neck: Normal range of motion. Neck supple. No tracheal deviation present. No thyromegaly present.  Cardiovascular: Normal rate, regular rhythm, normal heart sounds and intact distal pulses.  Exam reveals no gallop and no friction rub.   No murmur heard. Pulmonary/Chest: Effort normal and breath sounds normal. No respiratory distress. He has no wheezes. He has no rales. He exhibits no tenderness.  Abdominal:  Soft. Bowel sounds are normal. He exhibits no mass. There is no tenderness. There is no rebound and no guarding.  Genitourinary: Rectum normal, prostate normal and penis normal.  Musculoskeletal: Normal range of motion. He exhibits no edema or tenderness.  Lymphadenopathy:    He has no cervical adenopathy.  Neurological: He is alert and oriented to person, place, and time. He has normal reflexes. No cranial nerve deficit.  Skin: Skin is warm and dry. No rash noted. No erythema. No pallor.  Psychiatric: He has a normal mood and affect. His behavior is normal. Judgment and thought content normal.  Nursing note and vitals reviewed.  BP 113/69 (BP Location: Right Arm)   Pulse 70   Temp 97.7 F (36.5 C) (Oral)   Ht 6' (1.829 m)   Wt 263 lb (119.3 kg)   BMI 35.67 kg/m         Assessment & Plan:  1. Pure hypercholesterolemia -Continue current treatment pending results of lab work - CBC with Differential/Platelet - NMR, lipoprofile  2. HYPERTENSION, CONTROLLED -The blood pressure is good today he will continue with current treatment - BMP8+EGFR - CBC with Differential/Platelet - Hepatic function panel  3. Benign prostatic hyperplasia, unspecified whether lower urinary tract symptoms present -The prostate remains enlarged but soft and smooth. He is not having any symptoms with passing his water currently. - CBC with Differential/Platelet - PSA, total and free - Urinalysis, Complete  4. Vitamin D deficiency -Continue with current treatment pending results of lab work - CBC with Differential/Platelet - VITAMIN D 25 Hydroxy (Vit-D Deficiency, Fractures)  5. Special screening for malignant neoplasms, colon - Fecal occult blood, imunochemical  6. Hypothyroidism due to acquired atrophy of thyroid -Continue with current treatment of levothyroxine and 150 g daily with 12.5 g 4 times a week and 25 g 3 times a week  7. Morbid obesity (HCC) -Work aggressively on weight through  diet and exercise  8. Chronic kidney disease, stage III (moderate) -Follow-up with nephrology as doing  9. Slow transit constipation -Try Linzess  Patient Instructions                       Medicare Annual Wellness Visit  [place] and the medical providers at [facility] strive to bring you the best medical care.  In doing so we not only want to address your current medical conditions and concerns but also to detect new conditions early and prevent illness, disease and health-related problems.    Medicare offers a yearly Wellness Visit which allows our clinical staff to assess your need for preventative services including immunizations, lifestyle education, counseling to decrease risk of preventable diseases and screening for fall risk and other medical concerns.    This visit is provided free of charge (no copay) for all Medicare recipients. The clinical pharmacists at [facility] have begun to conduct these Wellness Visits which will also include a thorough review of all your medications.    As you primary medical provider recommend that you make an appointment for your  Annual Wellness Visit if you have not done so already this year.  You may set up this appointment before you leave today or you may call back [phone]) and schedule an appointment.  Please make sure when you call that you mention that you are scheduling your Annual Wellness Visit with the clinical pharmacist so that the appointment may be made for the proper length of time.     Continue current medications. Continue good therapeutic lifestyle changes which include good diet and exercise. Fall precautions discussed with patient. If an FOBT was given today- please return it to our front desk. If you are over 56 years old - you may need Prevnar 32 or the adult Pneumonia vaccine.  **Flu shots are available--- please call and schedule a FLU-CLINIC appointment**  After your  visit with [place] today you will receive a survey in the mail or online from [employer] regarding your care with [place]. Please take a moment to fill this out. Your feedback is very important to [place] as you can help [place] better understand your patient needs as well as improve your experience and satisfaction. WE CARE ABOUT YOU!!!   The flu shot he received today may make your arm sore We will call with lab work results as soon as those results become available Continue to monitor blood pressure readings at home Continue to make every effort at losing weight. Her body mass index is 35.7. If you have 2 or more risk factors then this puts you in a morbid obesity category. Once again, please work on weight loss. Try the Linzess 1 daily leave off a stool softener and the Benefiber during that time and see if this helps your constipation more     Arrie Senate MD

## 2016-08-16 NOTE — Patient Instructions (Addendum)
Medicare Annual Wellness Visit   and the medical providers at [facility] strive to bring you the best medical care.  In doing so we not only want to address your current medical conditions and concerns but also to detect new conditions early and prevent illness, disease and health-related problems.    Medicare offers a yearly Wellness Visit which allows our clinical staff to assess your need for preventative services including immunizations, lifestyle education, counseling to decrease risk of preventable diseases and screening for fall risk and other medical concerns.    This visit is provided free of charge (no copay) for all Medicare recipients. The clinical pharmacists at [facility] have begun to conduct these Wellness Visits which will also include a thorough review of all your medications.    As you primary medical provider recommend that you make an appointment for your Annual Wellness Visit if you have not done so already this year.  You may set up this appointment before you leave today or you may call back [phone] and schedule an appointment.  Please make sure when you call that you mention that you are scheduling your Annual Wellness Visit with the clinical pharmacist so that the appointment may be made for the proper length of time.     Continue current medications. Continue good therapeutic lifestyle changes which include good diet and exercise. Fall precautions discussed with patient. If an FOBT was given today- please return it to our front desk. If you are over 73 years old - you may need Prevnar 13 or the adult Pneumonia vaccine.  **Flu shots are available--- please call and schedule a FLU-CLINIC appointment**  After your visit with us today you will receive a survey in the mail or online from [employer] regarding your care with us. Please take a moment to fill this out. Your feedback is very  important to us as you can help us better understand your patient needs as well as improve your experience and satisfaction. WE CARE ABOUT YOU!!!   The flu shot he received today may make your arm sore We will call with lab work results as soon as those results become available Continue to monitor blood pressure readings at home Continue to make every effort at losing weight. Her body mass index is 35.7. If you have 2 or more risk factors then this puts you in a morbid obesity category. Once again, please work on weight loss. Try the Linzess 1 daily leave off a stool softener and the Benefiber during that time and see if this helps your constipation more

## 2016-08-17 LAB — HEPATIC FUNCTION PANEL
ALBUMIN: 4.7 g/dL (ref 3.5–4.8)
ALT: 40 IU/L (ref 0–44)
AST: 38 IU/L (ref 0–40)
Alkaline Phosphatase: 67 IU/L (ref 39–117)
BILIRUBIN TOTAL: 0.8 mg/dL (ref 0.0–1.2)
BILIRUBIN, DIRECT: 0.25 mg/dL (ref 0.00–0.40)
Total Protein: 7.4 g/dL (ref 6.0–8.5)

## 2016-08-17 LAB — BMP8+EGFR
BUN / CREAT RATIO: 12 (ref 10–24)
BUN: 19 mg/dL (ref 8–27)
CHLORIDE: 100 mmol/L (ref 96–106)
CO2: 27 mmol/L (ref 18–29)
Calcium: 9.7 mg/dL (ref 8.6–10.2)
Creatinine, Ser: 1.62 mg/dL — ABNORMAL HIGH (ref 0.76–1.27)
GFR calc non Af Amer: 41 mL/min/{1.73_m2} — ABNORMAL LOW (ref >59)
GFR, EST AFRICAN AMERICAN: 48 mL/min/{1.73_m2} — AB (ref >59)
Glucose: 104 mg/dL — ABNORMAL HIGH (ref 65–99)
POTASSIUM: 4 mmol/L (ref 3.5–5.2)
Sodium: 142 mmol/L (ref 134–144)

## 2016-08-17 LAB — CBC WITH DIFFERENTIAL/PLATELET
BASOS ABS: 0.1 10*3/uL (ref 0.0–0.2)
Basos: 1 %
EOS (ABSOLUTE): 0.4 10*3/uL (ref 0.0–0.4)
Eos: 4 %
HEMATOCRIT: 45.2 % (ref 37.5–51.0)
Hemoglobin: 15.6 g/dL (ref 12.6–17.7)
Immature Grans (Abs): 0 10*3/uL (ref 0.0–0.1)
Immature Granulocytes: 0 %
LYMPHS ABS: 2.4 10*3/uL (ref 0.7–3.1)
Lymphs: 29 %
MCH: 31 pg (ref 26.6–33.0)
MCHC: 34.5 g/dL (ref 31.5–35.7)
MCV: 90 fL (ref 79–97)
Monocytes Absolute: 1 10*3/uL — ABNORMAL HIGH (ref 0.1–0.9)
Monocytes: 12 %
NEUTROS ABS: 4.4 10*3/uL (ref 1.4–7.0)
Neutrophils: 54 %
Platelets: 278 10*3/uL (ref 150–379)
RBC: 5.04 x10E6/uL (ref 4.14–5.80)
RDW: 13.1 % (ref 12.3–15.4)
WBC: 8.2 10*3/uL (ref 3.4–10.8)

## 2016-08-17 LAB — PSA, TOTAL AND FREE
PSA FREE PCT: 39.6 %
PSA FREE: 0.91 ng/mL
Prostate Specific Ag, Serum: 2.3 ng/mL (ref 0.0–4.0)

## 2016-08-17 LAB — NMR, LIPOPROFILE
CHOLESTEROL: 115 mg/dL (ref 100–199)
HDL Cholesterol by NMR: 40 mg/dL (ref >39)
HDL PARTICLE NUMBER: 28.8 umol/L — AB (ref >=30.5)
LDL Particle Number: 730 nmol/L (ref <1000)
LDL SIZE: 20.5 nm (ref >20.5)
LDL-C: 53 mg/dL (ref 0–99)
LP-IR SCORE: 60 — AB (ref <=45)
Small LDL Particle Number: 347 nmol/L (ref <=527)
Triglycerides by NMR: 112 mg/dL (ref 0–149)

## 2016-08-17 LAB — VITAMIN D 25 HYDROXY (VIT D DEFICIENCY, FRACTURES): Vit D, 25-Hydroxy: 32.6 ng/mL (ref 30.0–100.0)

## 2016-08-17 LAB — FECAL OCCULT BLOOD, IMMUNOCHEMICAL: Fecal Occult Bld: NEGATIVE

## 2016-08-18 LAB — THYROID PANEL WITH TSH
FREE THYROXINE INDEX: 3 (ref 1.2–4.9)
T3 Uptake Ratio: 28 % (ref 24–39)
T4 TOTAL: 10.8 ug/dL (ref 4.5–12.0)
TSH: 7.14 u[IU]/mL — AB (ref 0.450–4.500)

## 2016-08-18 LAB — SPECIMEN STATUS REPORT

## 2016-08-19 ENCOUNTER — Other Ambulatory Visit: Payer: Self-pay | Admitting: *Deleted

## 2016-08-19 DIAGNOSIS — E039 Hypothyroidism, unspecified: Secondary | ICD-10-CM

## 2016-08-26 ENCOUNTER — Other Ambulatory Visit: Payer: Self-pay | Admitting: Orthopedic Surgery

## 2016-08-26 ENCOUNTER — Encounter: Payer: Self-pay | Admitting: *Deleted

## 2016-08-31 ENCOUNTER — Encounter (HOSPITAL_BASED_OUTPATIENT_CLINIC_OR_DEPARTMENT_OTHER): Payer: Self-pay | Admitting: *Deleted

## 2016-09-01 ENCOUNTER — Other Ambulatory Visit: Payer: Self-pay | Admitting: *Deleted

## 2016-09-01 MED ORDER — AMLODIPINE BESYLATE 10 MG PO TABS: ORAL_TABLET | ORAL | 3 refills | Status: DC

## 2016-09-07 ENCOUNTER — Encounter (HOSPITAL_BASED_OUTPATIENT_CLINIC_OR_DEPARTMENT_OTHER): Payer: Self-pay

## 2016-09-07 ENCOUNTER — Ambulatory Visit (HOSPITAL_BASED_OUTPATIENT_CLINIC_OR_DEPARTMENT_OTHER)
Admission: RE | Admit: 2016-09-07 | Discharge: 2016-09-07 | Disposition: A | Discharge status: Home / Self Care | Payer: Medicare Other | Source: Ambulatory Visit | Attending: Orthopedic Surgery | Admitting: Orthopedic Surgery

## 2016-09-07 ENCOUNTER — Encounter (HOSPITAL_BASED_OUTPATIENT_CLINIC_OR_DEPARTMENT_OTHER)
Admission: RE | Disposition: A | Discharge status: Home / Self Care | Payer: Self-pay | Source: Ambulatory Visit | Attending: Orthopedic Surgery

## 2016-09-07 ENCOUNTER — Ambulatory Visit (HOSPITAL_BASED_OUTPATIENT_CLINIC_OR_DEPARTMENT_OTHER): Payer: Medicare Other | Admitting: Certified Registered"

## 2016-09-07 DIAGNOSIS — Z79899 Other long term (current) drug therapy: Secondary | ICD-10-CM | POA: Diagnosis not present

## 2016-09-07 DIAGNOSIS — M21242 Flexion deformity, left finger joints: Secondary | ICD-10-CM | POA: Diagnosis not present

## 2016-09-07 DIAGNOSIS — X58XXXS Exposure to other specified factors, sequela: Secondary | ICD-10-CM | POA: Insufficient documentation

## 2016-09-07 DIAGNOSIS — S66113A Strain of flexor muscle, fascia and tendon of left middle finger at wrist and hand level, initial encounter: Secondary | ICD-10-CM | POA: Diagnosis not present

## 2016-09-07 DIAGNOSIS — M20092 Other deformity of left finger(s): Secondary | ICD-10-CM | POA: Diagnosis present

## 2016-09-07 DIAGNOSIS — E039 Hypothyroidism, unspecified: Secondary | ICD-10-CM | POA: Diagnosis not present

## 2016-09-07 DIAGNOSIS — N189 Chronic kidney disease, unspecified: Secondary | ICD-10-CM | POA: Insufficient documentation

## 2016-09-07 DIAGNOSIS — Z87891 Personal history of nicotine dependence: Secondary | ICD-10-CM | POA: Insufficient documentation

## 2016-09-07 DIAGNOSIS — Z7982 Long term (current) use of aspirin: Secondary | ICD-10-CM | POA: Insufficient documentation

## 2016-09-07 DIAGNOSIS — G8918 Other acute postprocedural pain: Secondary | ICD-10-CM | POA: Diagnosis not present

## 2016-09-07 DIAGNOSIS — I129 Hypertensive chronic kidney disease with stage 1 through stage 4 chronic kidney disease, or unspecified chronic kidney disease: Secondary | ICD-10-CM | POA: Diagnosis not present

## 2016-09-07 HISTORY — PX: REPAIR EXTENSOR TENDON: SHX5382

## 2016-09-07 SURGERY — REPAIR, TENDON, EXTENSOR
Anesthesia: General | Site: Hand | Laterality: Left

## 2016-09-07 MED ORDER — DEXAMETHASONE SODIUM PHOSPHATE 10 MG/ML IJ SOLN
INTRAMUSCULAR | Status: AC
  Filled 2016-09-07: qty 1

## 2016-09-07 MED ORDER — LIDOCAINE 2% (20 MG/ML) 5 ML SYRINGE
INTRAMUSCULAR | Status: DC | PRN
  Administered 2016-09-07: 80 mg via INTRAVENOUS

## 2016-09-07 MED ORDER — CEFAZOLIN SODIUM-DEXTROSE 2-4 GM/100ML-% IV SOLN
INTRAVENOUS | Status: AC
  Filled 2016-09-07: qty 100

## 2016-09-07 MED ORDER — FENTANYL CITRATE (PF) 100 MCG/2ML IJ SOLN
INTRAMUSCULAR | Status: AC
  Filled 2016-09-07: qty 2

## 2016-09-07 MED ORDER — ONDANSETRON HCL 4 MG/2ML IJ SOLN
INTRAMUSCULAR | Status: DC | PRN
  Administered 2016-09-07: 4 mg via INTRAVENOUS

## 2016-09-07 MED ORDER — ONDANSETRON HCL 4 MG/2ML IJ SOLN
INTRAMUSCULAR | Status: AC
  Filled 2016-09-07: qty 2

## 2016-09-07 MED ORDER — MIDAZOLAM HCL 2 MG/2ML IJ SOLN
INTRAMUSCULAR | Status: AC
  Filled 2016-09-07: qty 2

## 2016-09-07 MED ORDER — CEFAZOLIN IN D5W 1 GM/50ML IV SOLN
INTRAVENOUS | Status: AC
  Filled 2016-09-07: qty 50

## 2016-09-07 MED ORDER — PROPOFOL 10 MG/ML IV BOLUS
INTRAVENOUS | Status: AC
  Filled 2016-09-07: qty 20

## 2016-09-07 MED ORDER — CEFAZOLIN SODIUM-DEXTROSE 2-4 GM/100ML-% IV SOLN
2.0000 g | INTRAVENOUS | Status: AC
  Administered 2016-09-07: 3 g via INTRAVENOUS

## 2016-09-07 MED ORDER — MIDAZOLAM HCL 2 MG/2ML IJ SOLN
1.0000 mg | INTRAMUSCULAR | Status: DC | PRN
  Administered 2016-09-07: 1 mg via INTRAVENOUS

## 2016-09-07 MED ORDER — BUPIVACAINE-EPINEPHRINE (PF) 0.5% -1:200000 IJ SOLN
INTRAMUSCULAR | Status: DC | PRN
  Administered 2016-09-07: 30 mL via PERINEURAL

## 2016-09-07 MED ORDER — HYDROCODONE-ACETAMINOPHEN 10-325 MG PO TABS: 1.0000 | ORAL_TABLET | Freq: Four times a day (QID) | ORAL | 0 refills | Status: DC | PRN

## 2016-09-07 MED ORDER — LIDOCAINE 2% (20 MG/ML) 5 ML SYRINGE
INTRAMUSCULAR | Status: AC
  Filled 2016-09-07: qty 5

## 2016-09-07 MED ORDER — PROMETHAZINE HCL 25 MG/ML IJ SOLN: 6.2500 mg | INTRAMUSCULAR | Status: DC | PRN

## 2016-09-07 MED ORDER — SCOPOLAMINE 1 MG/3DAYS TD PT72: 1.0000 | MEDICATED_PATCH | Freq: Once | TRANSDERMAL | Status: DC | PRN

## 2016-09-07 MED ORDER — PROPOFOL 10 MG/ML IV BOLUS
INTRAVENOUS | Status: DC | PRN
  Administered 2016-09-07: 170 mg via INTRAVENOUS

## 2016-09-07 MED ORDER — LACTATED RINGERS IV SOLN
INTRAVENOUS | Status: DC
  Administered 2016-09-07: 10 mL/h via INTRAVENOUS
  Administered 2016-09-07 (×2): via INTRAVENOUS

## 2016-09-07 MED ORDER — FENTANYL CITRATE (PF) 100 MCG/2ML IJ SOLN
50.0000 ug | INTRAMUSCULAR | Status: DC | PRN
  Administered 2016-09-07: 50 ug via INTRAVENOUS

## 2016-09-07 MED ORDER — DEXAMETHASONE SODIUM PHOSPHATE 4 MG/ML IJ SOLN
INTRAMUSCULAR | Status: DC | PRN
  Administered 2016-09-07: 10 mg via INTRAVENOUS

## 2016-09-07 MED ORDER — CHLORHEXIDINE GLUCONATE 4 % EX LIQD: 60.0000 mL | Freq: Once | CUTANEOUS | Status: DC

## 2016-09-07 MED ORDER — HYDROMORPHONE HCL 1 MG/ML IJ SOLN: 0.2500 mg | INTRAMUSCULAR | Status: DC | PRN

## 2016-09-07 SURGICAL SUPPLY — 81 items
BAG DECANTER FOR FLEXI CONT (MISCELLANEOUS) IMPLANT
BALL CTTN LRG ABS STRL LF (GAUZE/BANDAGES/DRESSINGS)
BLADE CLIPPER SURG (BLADE) ×1 IMPLANT
BLADE MINI RND TIP GREEN BEAV (BLADE) ×1 IMPLANT
BLADE SURG 15 STRL LF DISP TIS (BLADE) ×1 IMPLANT
BLADE SURG 15 STRL SS (BLADE) ×2
BNDG CMPR 9X4 STRL LF SNTH (GAUZE/BANDAGES/DRESSINGS)
BNDG COHESIVE 3X5 TAN STRL LF (GAUZE/BANDAGES/DRESSINGS) ×2 IMPLANT
BNDG ESMARK 4X9 LF (GAUZE/BANDAGES/DRESSINGS) IMPLANT
BNDG GAUZE ELAST 4 BULKY (GAUZE/BANDAGES/DRESSINGS) ×2 IMPLANT
CHLORAPREP W/TINT 26ML (MISCELLANEOUS) ×2 IMPLANT
CORDS BIPOLAR (ELECTRODE) ×2 IMPLANT
COTTONBALL LRG STERILE PKG (GAUZE/BANDAGES/DRESSINGS) IMPLANT
COVER BACK TABLE 60X90IN (DRAPES) ×2 IMPLANT
COVER MAYO STAND STRL (DRAPES) ×2 IMPLANT
CUFF TOURNIQUET SINGLE 18IN (TOURNIQUET CUFF) IMPLANT
DECANTER SPIKE VIAL GLASS SM (MISCELLANEOUS) IMPLANT
DRAIN TLS ROUND 10FR (DRAIN) IMPLANT
DRAPE EXTREMITY T 121X128X90 (DRAPE) ×2 IMPLANT
DRAPE OEC MINIVIEW 54X84 (DRAPES) ×1 IMPLANT
DRAPE SURG 17X23 STRL (DRAPES) ×2 IMPLANT
GAUZE SPONGE 4X4 12PLY STRL (GAUZE/BANDAGES/DRESSINGS) ×2 IMPLANT
GAUZE SPONGE 4X4 16PLY XRAY LF (GAUZE/BANDAGES/DRESSINGS) IMPLANT
GAUZE XEROFORM 1X8 LF (GAUZE/BANDAGES/DRESSINGS) ×2 IMPLANT
GLOVE BIO SURGEON STRL SZ7.5 (GLOVE) ×1 IMPLANT
GLOVE BIOGEL PI IND STRL 7.0 (GLOVE) IMPLANT
GLOVE BIOGEL PI IND STRL 8 (GLOVE) IMPLANT
GLOVE BIOGEL PI IND STRL 8.5 (GLOVE) ×1 IMPLANT
GLOVE BIOGEL PI INDICATOR 7.0 (GLOVE) ×2
GLOVE BIOGEL PI INDICATOR 8 (GLOVE) ×1
GLOVE BIOGEL PI INDICATOR 8.5 (GLOVE) ×1
GLOVE ECLIPSE 6.5 STRL STRAW (GLOVE) ×1 IMPLANT
GLOVE SURG ORTHO 8.0 STRL STRW (GLOVE) ×2 IMPLANT
GOWN STRL REUS W/ TWL LRG LVL3 (GOWN DISPOSABLE) ×1 IMPLANT
GOWN STRL REUS W/TWL LRG LVL3 (GOWN DISPOSABLE) ×2
GOWN STRL REUS W/TWL XL LVL3 (GOWN DISPOSABLE) ×3 IMPLANT
K-WIRE .035X4 (WIRE) ×1 IMPLANT
LOOP VESSEL MAXI BLUE (MISCELLANEOUS) IMPLANT
NDL KEITH (NEEDLE) IMPLANT
NDL PRECISIONGLIDE 27X1.5 (NEEDLE) IMPLANT
NEEDLE HYPO 22GX1.5 SAFETY (NEEDLE) IMPLANT
NEEDLE KEITH (NEEDLE) IMPLANT
NEEDLE PRECISIONGLIDE 27X1.5 (NEEDLE) IMPLANT
NS IRRIG 1000ML POUR BTL (IV SOLUTION) ×2 IMPLANT
PACK BASIN DAY SURGERY FS (CUSTOM PROCEDURE TRAY) ×2 IMPLANT
PAD CAST 3X4 CTTN HI CHSV (CAST SUPPLIES) ×1 IMPLANT
PADDING CAST ABS 3INX4YD NS (CAST SUPPLIES)
PADDING CAST ABS 4INX4YD NS (CAST SUPPLIES) ×1
PADDING CAST ABS COTTON 3X4 (CAST SUPPLIES) IMPLANT
PADDING CAST ABS COTTON 4X4 ST (CAST SUPPLIES) ×1 IMPLANT
PADDING CAST COTTON 3X4 STRL (CAST SUPPLIES) ×2
SLEEVE SCD COMPRESS KNEE MED (MISCELLANEOUS) ×1 IMPLANT
SLING ARM FOAM STRAP XLG (SOFTGOODS) ×1 IMPLANT
SPLINT PLASTER CAST XFAST 3X15 (CAST SUPPLIES) IMPLANT
SPLINT PLASTER XTRA FASTSET 3X (CAST SUPPLIES) ×10
STOCKINETTE 4X48 STRL (DRAPES) ×2 IMPLANT
SUT CHROMIC 5 0 P 3 (SUTURE) IMPLANT
SUT ETHIBOND 3-0 V-5 (SUTURE) IMPLANT
SUT ETHILON 4 0 PS 2 18 (SUTURE) ×3 IMPLANT
SUT ETHILON 5 0 PC 1 (SUTURE) ×2 IMPLANT
SUT FIBERWIRE 2-0 18 17.9 3/8 (SUTURE)
SUT FIBERWIRE 4-0 18 TAPR NDL (SUTURE)
SUT MERSILENE 2.0 SH NDLE (SUTURE) IMPLANT
SUT MERSILENE 3 0 FS 1 (SUTURE) IMPLANT
SUT MERSILENE 4 0 P 3 (SUTURE) ×1 IMPLANT
SUT PROLENE 2 0 SH DA (SUTURE) IMPLANT
SUT SILK 2 0 FS (SUTURE) IMPLANT
SUT SILK 4 0 PS 2 (SUTURE) IMPLANT
SUT STEEL 3 0 (SUTURE) IMPLANT
SUT VIC AB 3-0 PS1 18 (SUTURE)
SUT VIC AB 3-0 PS1 18XBRD (SUTURE) IMPLANT
SUT VIC AB 4-0 P-3 18XBRD (SUTURE) IMPLANT
SUT VIC AB 4-0 P3 18 (SUTURE) ×2
SUT VICRYL 4-0 PS2 18IN ABS (SUTURE) IMPLANT
SUTURE FIBERWR 2-0 18 17.9 3/8 (SUTURE) IMPLANT
SUTURE FIBERWR 4-0 18 TAPR NDL (SUTURE) IMPLANT
SYR BULB 3OZ (MISCELLANEOUS) ×2 IMPLANT
SYR CONTROL 10ML LL (SYRINGE) IMPLANT
TOWEL OR 17X24 6PK STRL BLUE (TOWEL DISPOSABLE) ×4 IMPLANT
TUBE FEEDING 5FR 15 INCH (TUBING) IMPLANT
UNDERPAD 30X30 (UNDERPADS AND DIAPERS) ×1 IMPLANT

## 2016-09-07 NOTE — Transfer of Care (Signed)
Immediate Anesthesia Transfer of Care Note  Patient: Dylan OhmJames R Niese  Procedure(s) Performed: Procedure(s): Reconstruction PULLEY sheath left middle finger Extensor retinaculum graft (Left)  Patient Location: PACU  Anesthesia Type:General  Level of Consciousness: awake  Airway & Oxygen Therapy: Patient Spontanous Breathing and Patient connected to face mask oxygen  Post-op Assessment: Report given to RN and Post -op Vital signs reviewed and stable  Post vital signs: Reviewed and stable  Last Vitals:  Vitals:   09/07/16 1310 09/07/16 1315  BP:    Pulse: 70 82  Resp: 17 14  Temp:      Last Pain:  Vitals:   09/07/16 1226  TempSrc: Oral         Complications: No apparent anesthesia complications

## 2016-09-07 NOTE — Op Note (Signed)
Dictation Number dictated on 09/07/16 @ 15:08

## 2016-09-07 NOTE — Op Note (Signed)
I assisted Surgeon(s) and Role:    * Cindee SaltGary Cynthya Yam, MD - Primary    * Betha LoaKevin Charlott Calvario, MD - Assisting on the Procedure(s): Reconstruction PULLEY sheath left middle finger Extensor retinaculum graft on 09/07/2016.  I provided assistance on this case as follows: retraction of soft tissues, harvesting of graft, passing of graft, closure wounds.  Electronically signed by: Tami RibasKUZMA,Chantal Worthey R, MD Date: 09/07/2016 Time: 2:56 PM

## 2016-09-07 NOTE — Anesthesia Preprocedure Evaluation (Signed)
Anesthesia Evaluation  Patient identified by MRN, date of birth, ID band Patient awake    Reviewed: Allergy & Precautions, NPO status , Patient's Chart, lab work & pertinent test results  Airway Mallampati: II  TM Distance: >3 FB Neck ROM: Full    Dental  (+) Teeth Intact   Pulmonary neg pulmonary ROS, former smoker,    breath sounds clear to auscultation       Cardiovascular hypertension,  Rhythm:Regular Rate:Normal     Neuro/Psych    GI/Hepatic negative GI ROS, Neg liver ROS,   Endo/Other  Hypothyroidism   Renal/GU Renal InsufficiencyRenal disease     Musculoskeletal   Abdominal   Peds  Hematology   Anesthesia Other Findings   Reproductive/Obstetrics                             Anesthesia Physical Anesthesia Plan  ASA: II  Anesthesia Plan: General   Post-op Pain Management: GA combined w/ Regional for post-op pain   Induction: Intravenous  Airway Management Planned: LMA  Additional Equipment:   Intra-op Plan:   Post-operative Plan: Extubation in OR  Informed Consent: I have reviewed the patients History and Physical, chart, labs and discussed the procedure including the risks, benefits and alternatives for the proposed anesthesia with the patient or authorized representative who has indicated his/her understanding and acceptance.   Dental advisory given  Plan Discussed with: CRNA  Anesthesia Plan Comments:         Anesthesia Quick Evaluation

## 2016-09-07 NOTE — Discharge Instructions (Addendum)

## 2016-09-07 NOTE — H&P (Signed)
  [name] is an 75 y.o. male.   Chief Complaint: post contracture left middle finger HPI: [name] is a 75 year old right hand dominant male referred by Dr. Rudi Heaponald Moore for consultation with respect to contracture that he has to his left middle finger. This occurred in August of 2016 when he was starting a chainsaw. He had a kick back when he was trying to start it with the rope pulling his finger into extension. This occurred in [place], he went to the Emergency Room there, and they said that he had ruptured the tendon. They placed him in a splint, which he maintained then begin him on therapy. He has had a gradual flexion deformity occur to the left middle finger at the PIP joint, they gave him an LMB splint to wear and sent him to therapy. He states that it did not improve his ability to use or move his finger. He has been using heat; he has no prior history of injuries. He is not complaining of pain, only if he tries to forcibly extend it. He has a history of thyroid problems, no history of diabetes, arthritis or gout. There is family history of diabetes.He is now completed use of his digit widget application left middle finger. Marland Kitchen. He measures at 0 again with further flexion to 90          Past Medical History:  Diagnosis Date  . BPH (benign prostatic hypertrophy)   . CKD (chronic kidney disease)   . Essential hypertension, benign   . Hypothyroidism   . Right inguinal hernia   . Thyroid disease     Past Surgical History:  Procedure Laterality Date  . APPENDECTOMY    . 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 HERNIA REPAIR     abd    Family History  Problem Relation Age of Onset  . Parkinson's disease Mother   . Stroke Father    Social History:  reports that he quit smoking about 23 years ago. He has  a 21.00 pack-year smoking history. He has never used smokeless tobacco. He reports that he drinks alcohol. He reports that he does not use drugs.  Allergies:  Allergies  Allergen Reactions  . Paregoric     No prescriptions prior to admission.    No results found for this or any previous visit (from the past 48 hour(s)).  No results found.   Pertinent items are noted in HPI.  Height 6' (1.829 m), weight 119.3 kg (263 lb).  General appearance: alert, cooperative and appears stated age Head: Normocephalic, without obvious abnormality Neck: no JVD Resp: clear to auscultation bilaterally Cardio: regular rate and rhythm, S1, S2 normal, no murmur, click, rub or gallop GI: soft, non-tender; bowel sounds normal; no masses,  no organomegaly Extremities: post Digit Widget application left middle finger Pulses: 2+ and symmetric Skin: Skin color, texture, turgor normal. No rashes or lesions Neurologic: Grossly normal Incision/Wound: na  Assessment/Plan Dx; Flexor sheath rupture left middle finger Plan: reconstruction pulley with extensor retinaculum left middle finger.  Syona Wroblewski R 09/07/2016, 11:17 AM

## 2016-09-07 NOTE — Brief Op Note (Signed)
09/07/2016  3:01 PM  PATIENT:  [name]  75 y.o. male  PRE-OPERATIVE DIAGNOSIS:  flexor sheath rupture left middle finger  POST-OPERATIVE DIAGNOSIS:  flexor sheath rupture left middle finger  PROCEDURE:  Procedure(s): Reconstruction PULLEY sheath left middle finger Extensor retinaculum graft (Left)  SURGEON:  Surgeon(s) and Role:    * Cindee SaltGary Laszlo Ellerby, MD - Primary    * Betha LoaKevin Calyse Murcia, MD - Assisting  PHYSICIAN ASSISTANT:   ASSISTANTS: K Gaylin Osoria,MD   ANESTHESIA:   regional and general  EBL:  Total I/O In: 1300 [I.V.:1300] Out: 6 [Blood:6]  BLOOD ADMINISTERED:none  DRAINS: none   LOCAL MEDICATIONS USED:  NONE  SPECIMEN:  No Specimen  DISPOSITION OF SPECIMEN:  N/A  COUNTS:  YES  TOURNIQUET:   Total Tourniquet Time Documented: Upper Arm (Left) - 57 minutes Total: Upper Arm (Left) - 57 minutes   DICTATION: .Other Dictation: Dictation Number dictated on 09/07/16 @ 15:08  PLAN OF CARE: Discharge to home after PACU  PATIENT DISPOSITION:  PACU - hemodynamically stable.

## 2016-09-07 NOTE — Anesthesia Postprocedure Evaluation (Signed)
Anesthesia Post Note  Patient: Andreas OhmJames R Wall  Procedure(s) Performed: Procedure(s) (LRB): Reconstruction PULLEY sheath left middle finger Extensor retinaculum graft (Left)  Patient location during evaluation: PACU Anesthesia Type: General and Regional Level of consciousness: awake and alert Pain management: pain level controlled Vital Signs Assessment: post-procedure vital signs reviewed and stable Respiratory status: spontaneous breathing, nonlabored ventilation, respiratory function stable and patient connected to nasal cannula oxygen Cardiovascular status: blood pressure returned to baseline and stable Postop Assessment: no signs of nausea or vomiting Anesthetic complications: no    Last Vitals:  Vitals:   09/07/16 1545 09/07/16 1600  BP: 128/76 129/73  Pulse: 72 69  Resp: 14 12  Temp:      Last Pain:  Vitals:   09/07/16 1600  TempSrc:   PainSc: 0-No pain                 Kalisa Girtman,Juanpablo TERRILL

## 2016-09-07 NOTE — Progress Notes (Signed)
Assisted Dr. Massagee with left, ultrasound guided, supraclavicular block. Side rails up, monitors on throughout procedure. See vital signs in flow sheet. Tolerated Procedure well. 

## 2016-09-07 NOTE — Anesthesia Procedure Notes (Addendum)
Anesthesia Regional Block:  Supraclavicular block  Pre-Anesthetic Checklist: ,, timeout performed, Correct Patient, Correct Site, Correct Laterality, Correct Procedure, Correct Position, site marked, Risks and benefits discussed,  Surgical consent,  Pre-op evaluation,  At surgeon's request and post-op pain management  Laterality: Upper  Prep: chloraprep       Needles:   Needle Type: Echogenic Stimulator Needle     Needle Length: 9cm 9 cm Needle Gauge: 21 and 21 G  Needle insertion depth: 5 cm   Additional Needles:  Procedures: ultrasound guided (picture in chart) Supraclavicular block Narrative:  Start time: 09/07/2016 12:55 PM End time: 09/07/2016 1:11 PM Injection made incrementally with aspirations every 5 mL.  Performed by: Personally  Anesthesiologist: Lealand Elting  Additional Notes: tolerated well

## 2016-09-07 NOTE — Anesthesia Procedure Notes (Signed)
Procedure Name: LMA Insertion Date/Time: 09/07/2016 1:46 PM Performed by: Caren MacadamARTER, Gaelen Brager W Pre-anesthesia Checklist: Patient identified, Emergency Drugs available, Suction available and Patient being monitored Patient Re-evaluated:Patient Re-evaluated prior to inductionOxygen Delivery Method: Circle system utilized Preoxygenation: Pre-oxygenation with 100% oxygen Intubation Type: IV induction Ventilation: Mask ventilation without difficulty LMA: LMA inserted LMA Size: 5.0 Number of attempts: 1 Airway Equipment and Method: Bite block Placement Confirmation: positive ETCO2 and breath sounds checked- equal and bilateral Tube secured with: Tape Dental Injury: Teeth and Oropharynx as per pre-operative assessment  Comments: PER DK

## 2016-09-08 ENCOUNTER — Encounter (HOSPITAL_BASED_OUTPATIENT_CLINIC_OR_DEPARTMENT_OTHER): Payer: Self-pay | Admitting: Orthopedic Surgery

## 2016-09-08 NOTE — Op Note (Signed)
[name]:  [name], [name]                 ACCOUNT NO.:  [id number]  MEDICAL RECORD NO.:  [id number]  LOCATION:                                 FACILITY:  PHYSICIAN:  Cindee SaltGary Miata Culbreth, M.D.            DATE OF BIRTH:  DATE OF PROCEDURE:  09/07/2016 DATE OF DISCHARGE:                              OPERATIVE REPORT   PREOPERATIVE DIAGNOSIS:  Ruptured flexor pulley system, left middle finger A2 pulley.  POSTOPERATIVE DIAGNOSIS:  Ruptured flexor pulley system, left middle finger A2 pulley.  OPERATION:  Reconstruction A2 pulley using extensor retinaculum, left middle finger.  SURGEON:  Cindee SaltGary Kennadie Brenner, M.D.  ASSISTANT:  [name], M.D.  ANESTHESIA:  Supraclavicular block general.  ANESTHESIOLOGIST:  [name], M.D.  HISTORY:  The patient is a 75 year old male who was seen initially with a flexion deformity of his left finger.  This was relatively fixed in position.  MRI revealed a flexor sheath rupture.  He underwent digit widget application with straining of the contracture to the PIP joint. He is now admitted for the second part of the procedure for reconstruction of the flexor pulley sheath.  He is aware of risks and complications including infection; recurrence of injury to arteries, nerves, tendons; incomplete relief of symptoms; dystrophy.  In the preoperative area, the patient is seen, the extremity marked by both patient and surgeon.  Antibiotic given.  PROCEDURE IN DETAIL:  The patient was brought to the operating room after a supraclavicular block was carried out without difficulty in the preoperative area by Dr. [name].  General anesthetic was added to this.  He was prepped using ChloraPrep in supine position with the left arm free.  A 3-minute dry time was allowed.  Time-out taken, confirming the patient and procedure.  The limb was exsanguinated with an Esmarch bandage, tourniquet placed high on the arm was inflated to 250 mmHg.  A volar Brunner incision was made in the  distal palm and middle finger proximal phalanx.  This was carried down through subcutaneous tissue. Bleeders were electrocauterized with bipolar.  The neurovascular bundles radially and ulnarly were identified.  The flexor tendon sheath was immediately apparent.  The dissection was carried dorsally, radially and ulnarly identifying the lateral bands.  A tunnel was then made beneath the extensor tendon between the extensor tendon and bone.  This was then packed.  A transverse incision was made over the dorsum of the carpus. This was taken down through subcutaneous tissue.  Bleeders again electrocauterized as necessary.  Neurovascular structures were identified.  The incision covered the entire dorsum of the hand allowing harvesting of the extensor retinaculum approximately a centimeter and half wide from the first dorsal compartment to the sixth dorsal compartment.  Care was taken to protect tendons and neurovascular structures and dissection.  Following harvesting of the extensor graft, the wound was irrigated.  The subcutaneous tissue was closed with interrupted 4-0 Vicryl and the skin with interrupted 4-0 nylon sutures. The graft was then smoothed as much as possible removing except that went to the bone from the extensor retinaculum to smooth the surface as much as possible.  This was then  wrapped around the proximal phalanx. This was overlapped and sutured into position with horizontal mattress 4- 0 Mersilene sutures.  This was done so as to maintain the flexor tendon against the bone as much as possible.  A Freer elevator was not able to be passed between the normal flexor tunnel and the graft.  This was then slid around so that the suture area was on the lateral aspect of the finger and a smooth band of tissue was present over the flexor sheath. The wound was copiously irrigated with saline.  The PIP joint was then pinned in full extension using a 0.035 K-wire.  Position was  confirmed with image intensification.  The pin was bent, cut short.  The wound was then closed with interrupted 4-0 nylon sutures.  A sterile compressive dressing and dorsal splint was applied.  On deflation of the tourniquet, all fingers immediately pinked.  He was taken to the recovery room for observation in satisfactory condition.  He will be discharged home [facility], on [place].          ______________________________ [name], M.D.     GK/MEDQ  D:  09/07/2016  T:  09/08/2016  Job:  [id number]

## 2016-09-13 DIAGNOSIS — M25649 Stiffness of unspecified hand, not elsewhere classified: Secondary | ICD-10-CM | POA: Diagnosis not present

## 2016-09-27 DIAGNOSIS — S6390XA Sprain of unspecified part of unspecified wrist and hand, initial encounter: Secondary | ICD-10-CM | POA: Diagnosis not present

## 2016-09-27 DIAGNOSIS — M20022 Boutonniere deformity of left finger(s): Secondary | ICD-10-CM | POA: Diagnosis not present

## 2016-09-27 DIAGNOSIS — M25649 Stiffness of unspecified hand, not elsewhere classified: Secondary | ICD-10-CM | POA: Diagnosis not present

## 2016-09-29 ENCOUNTER — Other Ambulatory Visit: Payer: Self-pay | Admitting: *Deleted

## 2016-09-29 MED ORDER — LOVASTATIN 40 MG PO TABS: ORAL_TABLET | ORAL | 1 refills | Status: DC

## 2016-10-04 DIAGNOSIS — S6390XA Sprain of unspecified part of unspecified wrist and hand, initial encounter: Secondary | ICD-10-CM | POA: Diagnosis not present

## 2016-10-04 DIAGNOSIS — S66113D Strain of flexor muscle, fascia and tendon of left middle finger at wrist and hand level, subsequent encounter: Secondary | ICD-10-CM | POA: Diagnosis not present

## 2016-10-04 DIAGNOSIS — M25649 Stiffness of unspecified hand, not elsewhere classified: Secondary | ICD-10-CM | POA: Diagnosis not present

## 2016-10-04 DIAGNOSIS — M20022 Boutonniere deformity of left finger(s): Secondary | ICD-10-CM | POA: Diagnosis not present

## 2016-10-11 DIAGNOSIS — M25649 Stiffness of unspecified hand, not elsewhere classified: Secondary | ICD-10-CM | POA: Diagnosis not present

## 2016-10-11 DIAGNOSIS — M20022 Boutonniere deformity of left finger(s): Secondary | ICD-10-CM | POA: Diagnosis not present

## 2016-10-11 DIAGNOSIS — S6390XA Sprain of unspecified part of unspecified wrist and hand, initial encounter: Secondary | ICD-10-CM | POA: Diagnosis not present

## 2016-10-14 DIAGNOSIS — S6390XA Sprain of unspecified part of unspecified wrist and hand, initial encounter: Secondary | ICD-10-CM | POA: Diagnosis not present

## 2016-10-14 DIAGNOSIS — M25649 Stiffness of unspecified hand, not elsewhere classified: Secondary | ICD-10-CM | POA: Diagnosis not present

## 2016-10-14 DIAGNOSIS — M20022 Boutonniere deformity of left finger(s): Secondary | ICD-10-CM | POA: Diagnosis not present

## 2016-10-18 ENCOUNTER — Other Ambulatory Visit: Payer: Self-pay | Admitting: *Deleted

## 2016-10-18 MED ORDER — LOSARTAN POTASSIUM 100 MG PO TABS: ORAL_TABLET | ORAL | 1 refills | Status: DC

## 2016-10-20 DIAGNOSIS — S6390XA Sprain of unspecified part of unspecified wrist and hand, initial encounter: Secondary | ICD-10-CM | POA: Diagnosis not present

## 2016-10-20 DIAGNOSIS — M20022 Boutonniere deformity of left finger(s): Secondary | ICD-10-CM | POA: Diagnosis not present

## 2016-10-20 DIAGNOSIS — M25649 Stiffness of unspecified hand, not elsewhere classified: Secondary | ICD-10-CM | POA: Diagnosis not present

## 2016-10-27 ENCOUNTER — Other Ambulatory Visit: Payer: Self-pay | Admitting: *Deleted

## 2016-10-27 DIAGNOSIS — M25649 Stiffness of unspecified hand, not elsewhere classified: Secondary | ICD-10-CM | POA: Diagnosis not present

## 2016-10-27 DIAGNOSIS — E039 Hypothyroidism, unspecified: Secondary | ICD-10-CM

## 2016-10-27 DIAGNOSIS — S6390XA Sprain of unspecified part of unspecified wrist and hand, initial encounter: Secondary | ICD-10-CM | POA: Diagnosis not present

## 2016-10-27 DIAGNOSIS — M20022 Boutonniere deformity of left finger(s): Secondary | ICD-10-CM | POA: Diagnosis not present

## 2016-11-01 ENCOUNTER — Other Ambulatory Visit: Payer: Self-pay | Admitting: *Deleted

## 2016-11-01 DIAGNOSIS — E039 Hypothyroidism, unspecified: Secondary | ICD-10-CM

## 2016-11-01 DIAGNOSIS — M25649 Stiffness of unspecified hand, not elsewhere classified: Secondary | ICD-10-CM | POA: Diagnosis not present

## 2016-11-01 DIAGNOSIS — S6390XA Sprain of unspecified part of unspecified wrist and hand, initial encounter: Secondary | ICD-10-CM | POA: Diagnosis not present

## 2016-11-01 DIAGNOSIS — M20022 Boutonniere deformity of left finger(s): Secondary | ICD-10-CM | POA: Diagnosis not present

## 2016-11-01 NOTE — Addendum Note (Signed)
Addended by: Monica BectonHODGES, Ivie Savitt F on: 11/01/2016 09:23 AM   Modules accepted: Orders

## 2016-11-02 LAB — THYROID PANEL WITH TSH
FREE THYROXINE INDEX: 2.6 (ref 1.2–4.9)
T3 UPTAKE RATIO: 26 % (ref 24–39)
T4, Total: 9.9 ug/dL (ref 4.5–12.0)
TSH: 6.84 u[IU]/mL — ABNORMAL HIGH (ref 0.450–4.500)

## 2016-11-05 DIAGNOSIS — S6390XA Sprain of unspecified part of unspecified wrist and hand, initial encounter: Secondary | ICD-10-CM | POA: Diagnosis not present

## 2016-11-05 DIAGNOSIS — M20022 Boutonniere deformity of left finger(s): Secondary | ICD-10-CM | POA: Diagnosis not present

## 2016-11-05 DIAGNOSIS — M25649 Stiffness of unspecified hand, not elsewhere classified: Secondary | ICD-10-CM | POA: Diagnosis not present

## 2016-11-09 ENCOUNTER — Other Ambulatory Visit: Payer: Self-pay | Admitting: *Deleted

## 2016-11-09 MED ORDER — LEVOTHYROXINE SODIUM 25 MCG PO TABS: 25.0000 ug | ORAL_TABLET | Freq: Every day | ORAL | 3 refills | Status: DC

## 2016-11-15 DIAGNOSIS — S6390XA Sprain of unspecified part of unspecified wrist and hand, initial encounter: Secondary | ICD-10-CM | POA: Diagnosis not present

## 2016-11-15 DIAGNOSIS — M25649 Stiffness of unspecified hand, not elsewhere classified: Secondary | ICD-10-CM | POA: Diagnosis not present

## 2016-11-15 DIAGNOSIS — M20022 Boutonniere deformity of left finger(s): Secondary | ICD-10-CM | POA: Diagnosis not present

## 2016-12-01 DIAGNOSIS — Z0100 Encounter for examination of eyes and vision without abnormal findings: Secondary | ICD-10-CM | POA: Diagnosis not present

## 2016-12-09 ENCOUNTER — Telehealth: Payer: Self-pay | Admitting: Family Medicine

## 2016-12-09 DIAGNOSIS — E039 Hypothyroidism, unspecified: Secondary | ICD-10-CM

## 2016-12-10 ENCOUNTER — Other Ambulatory Visit: Payer: Self-pay | Admitting: *Deleted

## 2016-12-10 DIAGNOSIS — E038 Other specified hypothyroidism: Secondary | ICD-10-CM

## 2016-12-10 DIAGNOSIS — E039 Hypothyroidism, unspecified: Secondary | ICD-10-CM

## 2016-12-10 NOTE — Telephone Encounter (Signed)
Lab order corrected  

## 2016-12-10 NOTE — Addendum Note (Signed)
Addended by: Bearl MulberryUTHERFORD, Haide Klinker K on: 12/10/2016 01:05 PM   Modules accepted: Orders

## 2016-12-13 DIAGNOSIS — S6390XA Sprain of unspecified part of unspecified wrist and hand, initial encounter: Secondary | ICD-10-CM | POA: Diagnosis not present

## 2016-12-13 DIAGNOSIS — M20022 Boutonniere deformity of left finger(s): Secondary | ICD-10-CM | POA: Diagnosis not present

## 2016-12-13 DIAGNOSIS — E039 Hypothyroidism, unspecified: Secondary | ICD-10-CM | POA: Diagnosis not present

## 2016-12-13 DIAGNOSIS — M25649 Stiffness of unspecified hand, not elsewhere classified: Secondary | ICD-10-CM | POA: Diagnosis not present

## 2016-12-14 LAB — THYROID PANEL WITH TSH
Free Thyroxine Index: 2.6 (ref 1.2–4.9)
T3 Uptake Ratio: 27 % (ref 24–39)
T4 TOTAL: 9.5 ug/dL (ref 4.5–12.0)
TSH: 5.9 u[IU]/mL — AB (ref 0.450–4.500)

## 2016-12-27 DIAGNOSIS — M25649 Stiffness of unspecified hand, not elsewhere classified: Secondary | ICD-10-CM | POA: Diagnosis not present

## 2016-12-27 DIAGNOSIS — S6390XA Sprain of unspecified part of unspecified wrist and hand, initial encounter: Secondary | ICD-10-CM | POA: Diagnosis not present

## 2016-12-27 DIAGNOSIS — M20022 Boutonniere deformity of left finger(s): Secondary | ICD-10-CM | POA: Diagnosis not present

## 2017-02-04 DIAGNOSIS — M20022 Boutonniere deformity of left finger(s): Secondary | ICD-10-CM | POA: Diagnosis not present

## 2017-02-04 DIAGNOSIS — S6390XA Sprain of unspecified part of unspecified wrist and hand, initial encounter: Secondary | ICD-10-CM | POA: Diagnosis not present

## 2017-02-11 ENCOUNTER — Ambulatory Visit (INDEPENDENT_AMBULATORY_CARE_PROVIDER_SITE_OTHER): Payer: Medicare Other | Admitting: Family Medicine

## 2017-02-11 ENCOUNTER — Encounter: Payer: Self-pay | Admitting: Family Medicine

## 2017-02-11 VITALS — BP 119/78 | HR 73 | Temp 97.1°F | Ht 72.0 in | Wt 265.8 lb

## 2017-02-11 DIAGNOSIS — I10 Essential (primary) hypertension: Secondary | ICD-10-CM

## 2017-02-11 DIAGNOSIS — Z8249 Family history of ischemic heart disease and other diseases of the circulatory system: Secondary | ICD-10-CM

## 2017-02-11 DIAGNOSIS — E559 Vitamin D deficiency, unspecified: Secondary | ICD-10-CM | POA: Diagnosis not present

## 2017-02-11 DIAGNOSIS — E7800 Pure hypercholesterolemia, unspecified: Secondary | ICD-10-CM | POA: Diagnosis not present

## 2017-02-11 DIAGNOSIS — Z1211 Encounter for screening for malignant neoplasm of colon: Secondary | ICD-10-CM

## 2017-02-11 DIAGNOSIS — E039 Hypothyroidism, unspecified: Secondary | ICD-10-CM

## 2017-02-11 NOTE — Progress Notes (Signed)
Subjective:    [name], male    DOB: [birth year], 76 y.o.   MRN: [id number]  HPI Patient is here today for his 6 month follow up on hypertension, hyperlipidemia, hypothyroidism, and vitamin d deficiency. Patient states that he also has a rash on his left leg. He also complains about being nauseated and light headed at times. She denies any chest pain shortness of breath trouble swallowing heartburn and vomiting diarrhea or blood in the stool. He is up-to-date on his colonoscopies these but will need one and a couple of years. He did have an episode recently when he was working in the yard and had some nausea and some lightheadedness and his blood pressure was checked by his wife it was 1:15 over the low 70s. I encouraged him to drink plenty of fluids when he is drinking outside. The patient denies any trouble with seeing blood in the stool or having black tarry bowel movements. He is passing his water without problems.   Review of Systems  Constitutional: Negative.   HENT: Negative.   Eyes: Negative.   Respiratory: Negative.   Cardiovascular: Negative.   Gastrointestinal: Positive for nausea.  Endocrine: Negative.   Genitourinary: Negative.   Musculoskeletal: Negative.   Skin: Positive for rash.       Left leg.  Allergic/Immunologic: Negative.   Neurological: Positive for light-headedness.  Hematological: Negative.   Psychiatric/Behavioral: Negative.      Patient Active Problem List   Diagnosis Date Noted  . Metabolic syndrome 56/43/3295  . Hyperlipidemia 01/31/2013  . Hypothyroid 01/31/2013  . BPH (benign prostatic hyperplasia) 01/31/2013  . CKD (chronic kidney disease) 01/31/2013  . HYPERTENSION, CONTROLLED 02/03/2010   Outpatient Encounter Prescriptions as of 02/11/2017  Medication Sig  . amLODipine (NORVASC) 10 MG tablet TAKE 1 BY MOUTH DAILY  . aspirin 81 MG chewable tablet Chew 81 mg by mouth daily.  . Cholecalciferol (HM VITAMIN D3) 4000 UNITS CAPS  Take 2,000 Units by mouth daily.   Marland Kitchen levothyroxine (LEVOTHROID) 25 MCG tablet Take 1 tablet (25 mcg total) by mouth daily before breakfast.  . levothyroxine (SYNTHROID, LEVOTHROID) 150 MCG tablet Take 1 tablet (150 mcg total) by mouth daily.  Marland Kitchen losartan (COZAAR) 100 MG tablet TAKE 1 BY MOUTH DAILY  . lovastatin (MEVACOR) 40 MG tablet TAKE 1 BY MOUTH AT BEDTIME  . Omega-3 Fatty Acids (FISH OIL) 1200 MG CAPS Take 1,200 mg by mouth daily.  . [DISCONTINUED] HYDROcodone-acetaminophen (NORCO) 10-325 MG tablet Take 1 tablet by mouth every 6 (six) hours as needed. (Patient not taking: Reported on 02/11/2017)   No facility-administered encounter medications on file as of 02/11/2017.    x    Objective:   Physical Exam  Constitutional: He is oriented to person, place, and time. He appears well-developed and well-nourished. No distress.  The patient is pleasant and alert  HENT:  Head: Normocephalic and atraumatic.  Right Ear: External ear normal.  Left Ear: External ear normal.  Nose: Nose normal.  Mouth/Throat: Oropharynx is clear and moist. No oropharyngeal exudate.  Eyes: Conjunctivae and EOM are normal. Pupils are equal, round, and reactive to light. Right eye exhibits no discharge. Left eye exhibits no discharge. No scleral icterus.  Neck: Normal range of motion. Neck supple. No thyromegaly present.  Bruits thyromegaly or anterior cervical adenopathy are not present  Cardiovascular: Normal rate, regular rhythm, normal heart sounds and intact distal pulses.   No murmur heard. The heart has a regular rate and rhythm at  72/m  Pulmonary/Chest: Effort normal and breath sounds normal. No respiratory distress. He has no wheezes. He has no rales. He exhibits no tenderness.  Clear anteriorly and posteriorly and no axillary adenopathy  Abdominal: Soft. Bowel sounds are normal. He exhibits no mass. There is no tenderness. There is no rebound and no guarding.  Minimal. Umbilical tenderness and no hernia  palpated. No liver or spleen enlargement and no epigastric tenderness. No inguinal adenopathy. No masses.  Musculoskeletal: Normal range of motion. He exhibits no edema.  Lymphadenopathy:    He has no cervical adenopathy.  Neurological: He is alert and oriented to person, place, and time. He has normal reflexes. No cranial nerve deficit.  Skin: Skin is warm and dry. No rash noted.  Psychiatric: He has a normal mood and affect. His behavior is normal. Judgment and thought content normal.  Nursing note and vitals reviewed.  BP 119/78   Pulse 73   Temp 97.1 F (36.2 C) (Oral)   Ht 6' (1.829 m)   Wt 265 lb 12.8 oz (120.6 kg)   BMI 36.05 kg/m         Assessment & Plan:  1. Hypothyroidism, adult -Any current treatment pending results of lab work - Thyroid Panel With TSH  2. Pure hypercholesterolemia -Continue with aggressive therapeutic lifestyle changes trying to achieve weight loss through diet and exercise. -Continue current cholesterol treatment pending results of lab work - NMR, lipoprofile  3. HYPERTENSION, CONTROLLED -The blood pressure is good today. Always remember to stay well hydrated and continue to watch sodium intake - BMP8+EGFR - Hepatic function panel  4. Vitamin D deficiency -Continue current treatment pending results of lab work - VITAMIN D 25 Hydroxy (Vit-D Deficiency, Fractures)  Patient Instructions  We will arrange for you to have an appointment with the cardiologist in [place], Dr. Percival Spanish for cardiac evaluation because of risk factors for heart disease and a positive family history for heart disease. Continue to take her thyroid medicine as directed and we will call you with the blood work results as soon as they become available Continue to work on weight loss and reduced sugar Always stay well hydrated and drink plenty of fluids  Arrie Senate MD

## 2017-02-11 NOTE — Patient Instructions (Signed)
We will arrange for you to have an appointment with the cardiologist in [place], Dr. Antoine Poche for cardiac evaluation because of risk factors for heart disease and a positive family history for heart disease. Continue to take her thyroid medicine as directed and we will call you with the blood work results as soon as they become available Continue to work on weight loss and reduced sugar Always stay well hydrated and drink plenty of fluids

## 2017-02-11 NOTE — Addendum Note (Signed)
Addended by: Tamera Punt on: 02/11/2017 11:34 AM   Modules accepted: Orders

## 2017-02-12 LAB — NMR, LIPOPROFILE
CHOLESTEROL: 104 mg/dL (ref 100–199)
HDL Cholesterol by NMR: 39 mg/dL — ABNORMAL LOW (ref >39)
HDL Particle Number: 27.9 umol/L — ABNORMAL LOW (ref >=30.5)
LDL PARTICLE NUMBER: 814 nmol/L (ref <1000)
LDL SIZE: 20.3 nm (ref >20.5)
LDL-C: 45 mg/dL (ref 0–99)
LP-IR SCORE: 55 — AB (ref <=45)
Small LDL Particle Number: 458 nmol/L (ref <=527)
Triglycerides by NMR: 98 mg/dL (ref 0–149)

## 2017-02-12 LAB — BMP8+EGFR
BUN/Creatinine Ratio: 14 (ref 10–24)
BUN: 23 mg/dL (ref 8–27)
CALCIUM: 9.2 mg/dL (ref 8.6–10.2)
CHLORIDE: 103 mmol/L (ref 96–106)
CO2: 25 mmol/L (ref 18–29)
Creatinine, Ser: 1.7 mg/dL — ABNORMAL HIGH (ref 0.76–1.27)
GFR calc Af Amer: 45 mL/min/{1.73_m2} — ABNORMAL LOW (ref >59)
GFR calc non Af Amer: 39 mL/min/{1.73_m2} — ABNORMAL LOW (ref >59)
Glucose: 115 mg/dL — ABNORMAL HIGH (ref 65–99)
POTASSIUM: 3.9 mmol/L (ref 3.5–5.2)
Sodium: 144 mmol/L (ref 134–144)

## 2017-02-12 LAB — HEPATIC FUNCTION PANEL
ALBUMIN: 4.4 g/dL (ref 3.5–4.8)
ALT: 42 IU/L (ref 0–44)
AST: 44 IU/L — ABNORMAL HIGH (ref 0–40)
Alkaline Phosphatase: 60 IU/L (ref 39–117)
BILIRUBIN TOTAL: 0.9 mg/dL (ref 0.0–1.2)
Bilirubin, Direct: 0.29 mg/dL (ref 0.00–0.40)
TOTAL PROTEIN: 7 g/dL (ref 6.0–8.5)

## 2017-02-12 LAB — THYROID PANEL WITH TSH
FREE THYROXINE INDEX: 3 (ref 1.2–4.9)
T3 UPTAKE RATIO: 27 % (ref 24–39)
T4, Total: 11.1 ug/dL (ref 4.5–12.0)
TSH: 3.31 u[IU]/mL (ref 0.450–4.500)

## 2017-02-12 LAB — VITAMIN D 25 HYDROXY (VIT D DEFICIENCY, FRACTURES): Vit D, 25-Hydroxy: 35.3 ng/mL (ref 30.0–100.0)

## 2017-02-16 ENCOUNTER — Ambulatory Visit: Payer: Medicare Other | Admitting: Family Medicine

## 2017-03-09 NOTE — Progress Notes (Signed)
Cardiology Office Note   Date:  03/10/2017   ID:  Dylan OhmJames R Barstow, DOB 1940-11-25, MRN [id number]  PCP:  Ernestina PennaMoore, Donald W, MD  Cardiologist:   Rollene RotundaJames Zaylen Susman, MD  Referring:  Ernestina PennaMoore, Donald W, MD  Chief Complaint  Patient presents with  . Hypertension      History of Present Illness: [name] is a 76 y.o. male who presents for evaluation of multiple cardiovascular risk factors.  I saw him in 2012.  He had a negative POET (Plain Old Exercise Treadmill) in 2011 .  He has no past cardiac history.  He is active but does not exercise routinely.  He does push a mower and did this recently without chest pain.  He will occasionally get light headed and has low BPs.   He might get mildly SOB with activity.  The patient denies any new symptoms such as neck or arm discomfort. There has been no new  PND or orthopnea. There have been no reported palpitations, presyncope or syncope.  Past Medical History:  Diagnosis Date  . BPH (benign prostatic hypertrophy)   . CKD (chronic kidney disease)   . ED (erectile dysfunction)   . Essential hypertension, benign   . Hypothyroidism   . Right inguinal hernia   . Thyroid disease     Past Surgical History:  Procedure Laterality Date  . APPENDECTOMY    . 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 HERNIA REPAIR     abd  . REPAIR EXTENSOR TENDON Left 09/07/2016   Procedure: Reconstruction PULLEY sheath left middle finger Extensor retinaculum graft;  Surgeon: Cindee SaltGary Kuzma, MD;  Location: [facility];  Service: Orthopedics;  Laterality: Left;     Current Outpatient Prescriptions  Medication Sig Dispense Refill  . amLODipine (NORVASC) 10 MG tablet TAKE 1 BY MOUTH DAILY 90 tablet 3  . aspirin 81 MG chewable tablet Chew 81 mg by mouth daily.    . Cholecalciferol (HM  VITAMIN D3) 4000 UNITS CAPS Take 2,000 Units by mouth daily.     Marland Kitchen. levothyroxine (LEVOTHROID) 25 MCG tablet Take 1 tablet (25 mcg total) by mouth daily before breakfast. 90 tablet 3  . levothyroxine (SYNTHROID, LEVOTHROID) 150 MCG tablet Take 1 tablet (150 mcg total) by mouth daily. 90 tablet 2  . losartan (COZAAR) 100 MG tablet TAKE 1 BY MOUTH DAILY 90 tablet 1  . lovastatin (MEVACOR) 40 MG tablet TAKE 1 BY MOUTH AT BEDTIME 90 tablet 1  . Omega-3 Fatty Acids (FISH OIL) 1200 MG CAPS Take 1,000 mg by mouth daily.      No current facility-administered medications for this visit.     Allergies:   Paregoric    Social History:  The patient  reports that he quit smoking about 24 years ago. He has a 21.00 pack-year smoking history. He has never used smokeless tobacco. He reports that he drinks alcohol. He reports that he does not use drugs.   Family History:  The patient's family history includes Parkinson's disease in his mother; Stroke in his father.    ROS:  Please see the history of present illness.   Otherwise, review of systems are positive for none.   All other systems are reviewed and negative.    PHYSICAL EXAM: VS:  BP 132/80 (BP Location: Right Arm, Patient Position: Sitting, Cuff Size:  Normal)   Pulse 71   Ht 6' (1.829 m)   Wt 267 lb (121.1 kg)   SpO2 97%   BMI 36.21 kg/m  , BMI Body mass index is 36.21 kg/m. GENERAL:  Well appearing HEENT:  Pupils equal round and reactive, fundi not visualized, oral mucosa unremarkable NECK:  No jugular venous distention, waveform within normal limits, carotid upstroke brisk and symmetric, no bruits, no thyromegaly LYMPHATICS:  No cervical, inguinal adenopathy LUNGS:  Clear to auscultation bilaterally BACK:  No CVA tenderness CHEST:  Unremarkable HEART:  PMI not displaced or sustained,S1 and S2 within normal limits, no S3, no S4, no clicks, no rubs, no murmurs ABD:  Flat, positive bowel sounds normal in frequency in pitch, no bruits, no  rebound, no guarding, no midline pulsatile mass, no hepatomegaly, no splenomegaly, obese EXT:  2 plus pulses throughout, no edema, no cyanosis no clubbing SKIN:  No rashes no nodules NEURO:  Cranial nerves II through XII grossly intact, motor grossly intact throughout PSYCH:  Cognitively intact, oriented to person place and time    EKG:  EKG is ordered today. The ekg ordered 07/11/16 demonstrates NSR, rate 71, LAD, LAFB,  intervals WNL, no acute St T wave changes.    Recent Labs: 08/16/2016: Platelets 278 02/11/2017: ALT 42; BUN 23; Creatinine, Ser 1.70; Potassium 3.9; Sodium 144; TSH 3.310    Lipid Panel    Component Value Date/Time   CHOL 104 02/11/2017 1111   CHOL 98 01/31/2013 1047   TRIG 98 02/11/2017 1111   TRIG 83 01/31/2013 1047   HDL 39 (L) 02/11/2017 1111   HDL 37 (L) 01/31/2013 1047   LDLCALC 52 02/04/2014 0854   LDLCALC 44 01/31/2013 1047      Wt Readings from Last 3 Encounters:  03/10/17 267 lb (121.1 kg)  02/11/17 265 lb 12.8 oz (120.6 kg)  09/07/16 268 lb (121.6 kg)      Other studies Reviewed: Additional studies/ records that were reviewed today include: None. Review of the above records demonstrates:  Please see elsewhere in the note.     ASSESSMENT AND PLAN:  SOB:  I will bring the patient back for a POET (Plain Old Exercise Test). This will allow me to screen for obstructive coronary disease, risk stratify and very importantly provide a prescription for exercise.  DYSLIPIDEMIA:   LDL was 45 with HDL of 39.  No change in therapy is indicated.   HTN:  The blood pressure is at target. No change in medications is indicated. We will continue with therapeutic lifestyle changes (TLC).    OVERWEIGHT:  The patient understands the need to lose weight with diet and exercise. We have discussed specific strategies for this.  Current medicines are reviewed at length with the patient today.  The patient does not have concerns regarding medicines.  The  following changes have been made:  no change  Labs/ tests ordered today include:   Orders Placed This Encounter  Procedures  . EXERCISE TOLERANCE TEST     Disposition:   FU with as needed.     Signed, Rollene Rotunda, MD  03/10/2017 10:17 PM    [facility]

## 2017-03-10 ENCOUNTER — Encounter: Payer: Self-pay | Admitting: Cardiology

## 2017-03-10 ENCOUNTER — Ambulatory Visit (INDEPENDENT_AMBULATORY_CARE_PROVIDER_SITE_OTHER): Payer: Medicare Other | Admitting: Cardiology

## 2017-03-10 VITALS — BP 132/80 | HR 71 | Ht 72.0 in | Wt 267.0 lb

## 2017-03-10 DIAGNOSIS — I10 Essential (primary) hypertension: Secondary | ICD-10-CM

## 2017-03-10 DIAGNOSIS — E663 Overweight: Secondary | ICD-10-CM

## 2017-03-10 DIAGNOSIS — R0602 Shortness of breath: Secondary | ICD-10-CM | POA: Diagnosis not present

## 2017-03-10 DIAGNOSIS — E785 Hyperlipidemia, unspecified: Secondary | ICD-10-CM | POA: Diagnosis not present

## 2017-03-10 NOTE — Patient Instructions (Signed)

## 2017-03-25 ENCOUNTER — Telehealth (HOSPITAL_COMMUNITY): Payer: Self-pay | Admitting: *Deleted

## 2017-03-25 NOTE — Telephone Encounter (Signed)
Close encounter 

## 2017-03-30 ENCOUNTER — Ambulatory Visit (HOSPITAL_COMMUNITY)
Admission: RE | Admit: 2017-03-30 | Discharge: 2017-03-30 | Disposition: A | Discharge status: Home / Self Care | Payer: Medicare Other | Source: Ambulatory Visit | Attending: Cardiology | Admitting: Cardiology

## 2017-03-30 DIAGNOSIS — R0602 Shortness of breath: Secondary | ICD-10-CM | POA: Insufficient documentation

## 2017-03-30 LAB — EXERCISE TOLERANCE TEST
CHL CUP MPHR: 147 {beats}/min
CSEPED: 8 min
CSEPHR: 85 %
CSEPPHR: 125 {beats}/min
Estimated workload: 10 METS
Exercise duration (sec): 6 s
RPE: 18
Rest HR: 71 {beats}/min

## 2017-04-01 ENCOUNTER — Telehealth: Payer: Self-pay | Admitting: Cardiology

## 2017-04-01 NOTE — Telephone Encounter (Signed)
Pt notified, he will await Dr Rexene EdisonH advise/result

## 2017-04-01 NOTE — Telephone Encounter (Signed)
New message    Pt is calling about the results from exercise test.

## 2017-04-01 NOTE — Telephone Encounter (Signed)
Preliminary result:1. Normal exercise tolerance. 2. No evidence for ischemia by ST segment analysis.   [facility]

## 2017-04-04 ENCOUNTER — Other Ambulatory Visit: Payer: Self-pay

## 2017-04-04 MED ORDER — LEVOTHYROXINE SODIUM 150 MCG PO TABS: 150.0000 ug | ORAL_TABLET | Freq: Every day | ORAL | 2 refills | Status: DC

## 2017-05-16 ENCOUNTER — Other Ambulatory Visit: Payer: Self-pay | Admitting: *Deleted

## 2017-05-16 MED ORDER — LOSARTAN POTASSIUM 100 MG PO TABS: ORAL_TABLET | ORAL | 1 refills | Status: DC

## 2017-08-12 ENCOUNTER — Ambulatory Visit (INDEPENDENT_AMBULATORY_CARE_PROVIDER_SITE_OTHER): Payer: Medicare Other

## 2017-08-12 ENCOUNTER — Ambulatory Visit (INDEPENDENT_AMBULATORY_CARE_PROVIDER_SITE_OTHER): Payer: Medicare Other | Admitting: Family Medicine

## 2017-08-12 ENCOUNTER — Encounter: Payer: Self-pay | Admitting: Family Medicine

## 2017-08-12 VITALS — BP 130/75 | HR 65 | Temp 97.0°F | Ht 72.0 in | Wt 258.0 lb

## 2017-08-12 DIAGNOSIS — I10 Essential (primary) hypertension: Secondary | ICD-10-CM

## 2017-08-12 DIAGNOSIS — N400 Enlarged prostate without lower urinary tract symptoms: Secondary | ICD-10-CM

## 2017-08-12 DIAGNOSIS — N5201 Erectile dysfunction due to arterial insufficiency: Secondary | ICD-10-CM

## 2017-08-12 DIAGNOSIS — E039 Hypothyroidism, unspecified: Secondary | ICD-10-CM | POA: Diagnosis not present

## 2017-08-12 DIAGNOSIS — E7800 Pure hypercholesterolemia, unspecified: Secondary | ICD-10-CM

## 2017-08-12 DIAGNOSIS — E559 Vitamin D deficiency, unspecified: Secondary | ICD-10-CM | POA: Diagnosis not present

## 2017-08-12 DIAGNOSIS — Z8249 Family history of ischemic heart disease and other diseases of the circulatory system: Secondary | ICD-10-CM | POA: Diagnosis not present

## 2017-08-12 DIAGNOSIS — Z23 Encounter for immunization: Secondary | ICD-10-CM | POA: Diagnosis not present

## 2017-08-12 LAB — URINALYSIS, COMPLETE
Bilirubin, UA: NEGATIVE
GLUCOSE, UA: NEGATIVE
KETONES UA: NEGATIVE
Leukocytes, UA: NEGATIVE
NITRITE UA: NEGATIVE
RBC, UA: NEGATIVE
SPEC GRAV UA: 1.02 (ref 1.005–1.030)
UUROB: 0.2 mg/dL (ref 0.2–1.0)
pH, UA: 6 (ref 5.0–7.5)

## 2017-08-12 LAB — MICROSCOPIC EXAMINATION
Bacteria, UA: NONE SEEN
Epithelial Cells (non renal): NONE SEEN /hpf (ref 0–10)
RBC MICROSCOPIC, UA: NONE SEEN /HPF (ref 0–?)
RENAL EPITHEL UA: NONE SEEN /HPF
WBC UA: NONE SEEN /HPF (ref 0–?)

## 2017-08-12 MED ORDER — SILDENAFIL CITRATE 20 MG PO TABS: ORAL_TABLET | ORAL | 0 refills | Status: DC

## 2017-08-12 NOTE — Progress Notes (Signed)
Subjective:    [name], male    DOB: Sep 23, 1941, 76 y.o.   MRN: [id number]  HPI Pt here for follow up and management of chronic medical problems which includes hyperlipidemia and hypertension. He is taking medication regularly.  Patient complains of a rash on the left ankle.  He is due to get a level 4 physical exam today.  His vital signs are stable his weight is actually down by 9 pounds from the previous visit.  He will get his flu shot today and lab work today.  She is doing well overall and since his last visit he has had a exercise treadmill test with the cardiologist and did well with this.  Cardiologist encouraged him to work on losing more weight and staying active physically and he did lose some weight as a result of this recommendation.  He has been on a cruise recently and put some of the weight back on.  He denies any chest pain or shortness of breath.  He sees the eye doctor regularly.  He denies any trouble with swallowing heartburn indigestion nausea vomiting diarrhea or blood in the stool.  His last colonoscopy was in 2015 by Dr. May God and he is not sure if he needs an additional colonoscopy in 5 years and will check with the gastroenterologist about this.  There is no family history of colon cancer.  He is passing his water without problems.  He has had a prescription for Viagra in the past but has not been using this and would like a new prescription for this and may try the generic version.    Patient Active Problem List   Diagnosis Date Noted  . SOB (shortness of breath) 03/10/2017  . Dyslipidemia 03/10/2017  . Overweight 03/10/2017  . Metabolic syndrome 11/94/1740  . Hyperlipidemia 01/31/2013  . Hypothyroid 01/31/2013  . BPH (benign prostatic hyperplasia) 01/31/2013  . CKD (chronic kidney disease) 01/31/2013  . HYPERTENSION, CONTROLLED 02/03/2010   Outpatient Encounter Prescriptions as of 08/12/2017  Medication Sig  . amLODipine (NORVASC) 10 MG  tablet TAKE 1 BY MOUTH DAILY  . aspirin 81 MG chewable tablet Chew 81 mg by mouth daily.  . Cholecalciferol (HM VITAMIN D3) 4000 UNITS CAPS Take 2,000 Units by mouth daily.   Marland Kitchen levothyroxine (LEVOTHROID) 25 MCG tablet Take 1 tablet (25 mcg total) by mouth daily before breakfast. (Patient taking differently: Take 25 mcg by mouth daily before breakfast. Take 1.5 of the 25 mcg 5 days a week -- 1 the other 2 days)  . levothyroxine (SYNTHROID, LEVOTHROID) 150 MCG tablet Take 1 tablet (150 mcg total) by mouth daily.  Marland Kitchen losartan (COZAAR) 100 MG tablet TAKE 1 BY MOUTH DAILY  . lovastatin (MEVACOR) 40 MG tablet TAKE 1 BY MOUTH AT BEDTIME  . Omega-3 Fatty Acids (FISH OIL) 1200 MG CAPS Take 1,000 mg by mouth daily.    No facility-administered encounter medications on file as of 08/12/2017.       Review of Systems  Constitutional: Negative.   HENT: Negative.   Eyes: Negative.   Respiratory: Negative.   Cardiovascular: Negative.   Gastrointestinal: Negative.   Endocrine: Negative.   Genitourinary: Negative.        ED  Musculoskeletal: Negative.   Skin: Positive for rash (left ankle).  Allergic/Immunologic: Negative.   Neurological: Negative.   Hematological: Negative.   Psychiatric/Behavioral: Negative.        Objective:   Physical Exam  Constitutional: He is oriented to person,  place, and time. He appears well-developed.  The patient is pleasant and alert and is working actively on reducing his weight  HENT:  Head: Normocephalic and atraumatic.  Right Ear: External ear normal.  Left Ear: External ear normal.  Mouth/Throat: Oropharynx is clear and moist. No oropharyngeal exudate.  Slight nasal congestion left greater than right  Eyes: Pupils are equal, round, and reactive to light. Conjunctivae and EOM are normal. Right eye exhibits no discharge. Left eye exhibits no discharge. No scleral icterus.  Gets eye exams done regularly  Neck: Normal range of motion. Neck supple. No thyromegaly  present.  No bruits thyromegaly or anterior cervical adenopathy  Cardiovascular: Normal rate, regular rhythm, normal heart sounds and intact distal pulses.   No murmur heard. Heart is regular at 60/min  Pulmonary/Chest: Effort normal and breath sounds normal. No respiratory distress. He has no wheezes. He has no rales. He exhibits no tenderness.  Clear anteriorly and posteriorly and no axillary adenopathy  Abdominal: Soft. Bowel sounds are normal. He exhibits no mass. There is no tenderness. There is no rebound and no guarding.  No liver or spleen enlargement.  No masses.  No bruits.  No inguinal adenopathy.  Genitourinary: Rectum normal and penis normal.  Genitourinary Comments: The prostate is enlarged but soft and smooth.  There were no rectal masses.  The external genitalia were within normal limits inguinal hernias were palpated.  Musculoskeletal: Normal range of motion. He exhibits no edema or tenderness.  Lymphadenopathy:    He has no cervical adenopathy.  Neurological: He is alert and oriented to person, place, and time. He has normal reflexes. No cranial nerve deficit.  Skin: Skin is warm and dry. No rash noted.  Patchy area of skin on left ankle looks fungal in nature and patient will try Lamisil cream over-the-counter  Psychiatric: He has a normal mood and affect. His behavior is normal. Judgment and thought content normal.  Nursing note and vitals reviewed.   BP 130/75 (BP Location: Left Arm)   Pulse 65   Temp (!) 97 F (36.1 C) (Oral)   Ht 6' (1.829 m)   Wt 258 lb (117 kg)   BMI 34.99 kg/m        Assessment & Plan:  1. HYPERTENSION, CONTROLLED -Continue to watch sodium intake and continue with current treatment - BMP8+EGFR - CBC with Differential/Platelet - Hepatic function panel - DG Chest 2 View; Future  2. Hypothyroidism, adult -Continue with current treatment pending results of lab work - CBC with Differential/Platelet - Thyroid Panel With TSH  3. Pure  hypercholesterolemia -Continue with aggressive therapeutic lifestyle changes and current treatment pending results of lab work - CBC with Differential/Platelet - Lipid panel - DG Chest 2 View; Future  4. Vitamin D deficiency -Continue with vitamin D replacement pending results of lab work - CBC with Differential/Platelet - VITAMIN D 25 Hydroxy (Vit-D Deficiency, Fractures)  5. Benign prostatic hyperplasia, unspecified whether lower urinary tract symptoms present -Prostate is enlarged but the patient is pretty much asymptomatic with this. - CBC with Differential/Platelet - PSA, total and free - Urinalysis, Complete  6. Erectile dysfunction due to arterial insufficiency -Generic Viagra will be prescribed  7. Family history of heart disease -Patient should see cardiologist every couple of years  Patient Instructions                       Medicare Annual Wellness Visit  [place] and the medical providers at [facility] strive to bring you the best medical care.  In doing so we not only want to address your current medical conditions and concerns but also to detect new conditions early and prevent illness, disease and health-related problems.    Medicare offers a yearly Wellness Visit which allows our clinical staff to assess your need for preventative services including immunizations, lifestyle education, counseling to decrease risk of preventable diseases and screening for fall risk and other medical concerns.    This visit is provided free of charge (no copay) for all Medicare recipients. The clinical pharmacists at [facility] have begun to conduct these Wellness Visits which will also include a thorough review of all your medications.    As you primary medical provider recommend that you make an appointment for your Annual Wellness Visit if you have not done so already this year.  You may set up this appointment before you leave today  or you may call back [phone]) and schedule an appointment.  Please make sure when you call that you mention that you are scheduling your Annual Wellness Visit with the clinical pharmacist so that the appointment may be made for the proper length of time.     Continue current medications. Continue good therapeutic lifestyle changes which include good diet and exercise. Fall precautions discussed with patient. If an FOBT was given today- please return it to our front desk. If you are over 66 years old - you may need Prevnar 33 or the adult Pneumonia vaccine.  **Flu shots are available--- please call and schedule a FLU-CLINIC appointment**  After your visit with [place] today you will receive a survey in the mail or online from [employer] regarding your care with [place]. Please take a moment to fill this out. Your feedback is very important to [place] as you can help [place] better understand your patient needs as well as improve your experience and satisfaction. WE CARE ABOUT YOU!!!  Check with insurance regarding the new shingles shot Drink plenty of fluids this winter and stay well-hydrated Reduce the use of overhead fans Try Lamisil cream over-the-counter If your local drugstore would not do generic Viagra you can use [facility] in [facility]  Arrie Senate MD

## 2017-08-12 NOTE — Patient Instructions (Addendum)
Medicare Annual Wellness Visit  [place] and the medical providers at [facility] strive to bring you the best medical care.  In doing so we not only want to address your current medical conditions and concerns but also to detect new conditions early and prevent illness, disease and health-related problems.    Medicare offers a yearly Wellness Visit which allows our clinical staff to assess your need for preventative services including immunizations, lifestyle education, counseling to decrease risk of preventable diseases and screening for fall risk and other medical concerns.    This visit is provided free of charge (no copay) for all Medicare recipients. The clinical pharmacists at [facility] have begun to conduct these Wellness Visits which will also include a thorough review of all your medications.    As you primary medical provider recommend that you make an appointment for your Annual Wellness Visit if you have not done so already this year.  You may set up this appointment before you leave today or you may call back [phone] and schedule an appointment.  Please make sure when you call that you mention that you are scheduling your Annual Wellness Visit with the clinical pharmacist so that the appointment may be made for the proper length of time.     Continue current medications. Continue good therapeutic lifestyle changes which include good diet and exercise. Fall precautions discussed with patient. If an FOBT was given today- please return it to our front desk. If you are over 541 years old - you may need Prevnar 13 or the adult Pneumonia vaccine.  **Flu shots are available--- please call and schedule a FLU-CLINIC appointment**  After your visit with us today you will receive a survey in the mail or online from [employer] regarding your care with us. Please take a moment to fill this out. Your feedback is very  important to us as you can help us better understand your patient needs as well as improve your experience and satisfaction. WE CARE ABOUT YOU!!!  Check with insurance regarding the new shingles shot Drink plenty of fluids this winter and stay well-hydrated Reduce the use of overhead fans Try Lamisil cream over-the-counter If your local drugstore would not do generic Viagra you can use [facility] [place] Be sure and check with the gastroenterologist if and when you need another colonoscopy It would probably be a good idea with a family history to see the cardiologist every couple of years.

## 2017-08-12 NOTE — Addendum Note (Signed)
Addended by: Magdalene RiverBULLINS, Perla Echavarria H on: 08/12/2017 11:30 AM   Modules accepted: Orders

## 2017-08-13 LAB — CBC WITH DIFFERENTIAL/PLATELET
BASOS ABS: 0.1 10*3/uL (ref 0.0–0.2)
Basos: 1 %
EOS (ABSOLUTE): 0.4 10*3/uL (ref 0.0–0.4)
Eos: 4 %
HEMATOCRIT: 45.1 % (ref 37.5–51.0)
Hemoglobin: 15.5 g/dL (ref 13.0–17.7)
IMMATURE GRANULOCYTES: 0 %
Immature Grans (Abs): 0 10*3/uL (ref 0.0–0.1)
LYMPHS ABS: 2.8 10*3/uL (ref 0.7–3.1)
Lymphs: 31 %
MCH: 30.8 pg (ref 26.6–33.0)
MCHC: 34.4 g/dL (ref 31.5–35.7)
MCV: 90 fL (ref 79–97)
MONOS ABS: 0.9 10*3/uL (ref 0.1–0.9)
Monocytes: 10 %
NEUTROS PCT: 54 %
Neutrophils Absolute: 4.8 10*3/uL (ref 1.4–7.0)
PLATELETS: 300 10*3/uL (ref 150–379)
RBC: 5.03 x10E6/uL (ref 4.14–5.80)
RDW: 13.7 % (ref 12.3–15.4)
WBC: 9 10*3/uL (ref 3.4–10.8)

## 2017-08-13 LAB — HEPATIC FUNCTION PANEL
ALT: 25 IU/L (ref 0–44)
AST: 24 IU/L (ref 0–40)
Albumin: 4.8 g/dL (ref 3.5–4.8)
Alkaline Phosphatase: 72 IU/L (ref 39–117)
BILIRUBIN TOTAL: 0.6 mg/dL (ref 0.0–1.2)
Bilirubin, Direct: 0.19 mg/dL (ref 0.00–0.40)
Total Protein: 7.3 g/dL (ref 6.0–8.5)

## 2017-08-13 LAB — BMP8+EGFR
BUN / CREAT RATIO: 12 (ref 10–24)
BUN: 17 mg/dL (ref 8–27)
CHLORIDE: 98 mmol/L (ref 96–106)
CO2: 24 mmol/L (ref 20–29)
CREATININE: 1.43 mg/dL — AB (ref 0.76–1.27)
Calcium: 9.2 mg/dL (ref 8.6–10.2)
GFR calc Af Amer: 55 mL/min/{1.73_m2} — ABNORMAL LOW (ref >59)
GFR calc non Af Amer: 48 mL/min/{1.73_m2} — ABNORMAL LOW (ref >59)
GLUCOSE: 111 mg/dL — AB (ref 65–99)
Potassium: 3.8 mmol/L (ref 3.5–5.2)
SODIUM: 139 mmol/L (ref 134–144)

## 2017-08-13 LAB — THYROID PANEL WITH TSH
Free Thyroxine Index: 3.1 (ref 1.2–4.9)
T3 Uptake Ratio: 29 % (ref 24–39)
T4, Total: 10.6 ug/dL (ref 4.5–12.0)
TSH: 3.64 u[IU]/mL (ref 0.450–4.500)

## 2017-08-13 LAB — LIPID PANEL
CHOL/HDL RATIO: 2.6 ratio (ref 0.0–5.0)
Cholesterol, Total: 116 mg/dL (ref 100–199)
HDL: 44 mg/dL (ref >39)
LDL CALC: 48 mg/dL (ref 0–99)
TRIGLYCERIDES: 120 mg/dL (ref 0–149)
VLDL Cholesterol Cal: 24 mg/dL (ref 5–40)

## 2017-08-13 LAB — PSA, TOTAL AND FREE
PROSTATE SPECIFIC AG, SERUM: 2.4 ng/mL (ref 0.0–4.0)
PSA FREE PCT: 41.3 %
PSA, Free: 0.99 ng/mL

## 2017-08-13 LAB — VITAMIN D 25 HYDROXY (VIT D DEFICIENCY, FRACTURES): VIT D 25 HYDROXY: 38.8 ng/mL (ref 30.0–100.0)

## 2017-08-16 ENCOUNTER — Telehealth: Payer: Self-pay

## 2017-08-16 NOTE — Telephone Encounter (Signed)
Insurance denied prior auth for Sildenafil

## 2017-08-16 NOTE — Telephone Encounter (Signed)
If it will not pay for the generic it will not pay for the brand name.  The patient should see what it costs out-of-pocket and pay for it that way most likely.

## 2017-08-17 NOTE — Telephone Encounter (Signed)
Patient aware and verbalizes understanding. 

## 2017-09-02 ENCOUNTER — Other Ambulatory Visit: Payer: Self-pay | Admitting: Family Medicine

## 2017-09-12 ENCOUNTER — Other Ambulatory Visit: Payer: Self-pay | Admitting: *Deleted

## 2017-09-12 MED ORDER — AMLODIPINE BESYLATE 10 MG PO TABS: ORAL_TABLET | ORAL | 0 refills | Status: DC

## 2017-09-29 DIAGNOSIS — I8310 Varicose veins of unspecified lower extremity with inflammation: Secondary | ICD-10-CM | POA: Diagnosis not present

## 2017-09-29 DIAGNOSIS — C44529 Squamous cell carcinoma of skin of other part of trunk: Secondary | ICD-10-CM | POA: Diagnosis not present

## 2017-09-29 DIAGNOSIS — R600 Localized edema: Secondary | ICD-10-CM | POA: Diagnosis not present

## 2017-10-06 ENCOUNTER — Telehealth: Payer: Self-pay | Admitting: Family Medicine

## 2017-10-06 NOTE — Telephone Encounter (Signed)
NO FOBT resulted - pt states he mailed -aware that they can no longer be mailed and it did not get checked, nor will he receive a bill.

## 2017-10-19 ENCOUNTER — Other Ambulatory Visit: Payer: Self-pay

## 2017-10-19 MED ORDER — LEVOTHYROXINE SODIUM 150 MCG PO TABS: 150.0000 ug | ORAL_TABLET | Freq: Every day | ORAL | 2 refills | Status: DC

## 2017-10-26 ENCOUNTER — Other Ambulatory Visit: Payer: Self-pay | Admitting: Family Medicine

## 2017-10-27 ENCOUNTER — Other Ambulatory Visit: Payer: Self-pay | Admitting: *Deleted

## 2017-10-27 MED ORDER — LOSARTAN POTASSIUM 100 MG PO TABS: ORAL_TABLET | ORAL | 1 refills | Status: DC

## 2017-11-02 ENCOUNTER — Telehealth: Payer: Self-pay | Admitting: Family Medicine

## 2017-11-02 NOTE — Telephone Encounter (Signed)
Hemoccult card sent to patient

## 2017-11-03 MED ORDER — LEVOTHYROXINE SODIUM 25 MCG PO TABS: ORAL_TABLET | ORAL | 3 refills | Status: DC

## 2017-11-03 NOTE — Telephone Encounter (Signed)
What is the name of the medication? levothyroxine  Have you contacted your pharmacy to request a refill? No because the dosage was changed and taking more. [facility]  Which pharmacy would you like this sent to? [facility] [place] [phone]   Patient notified that their request is being sent to the clinical staff for review and that they should receive a call once it is complete. If they do not receive a call within 24 hours they can check with their pharmacy or our office.

## 2017-11-03 NOTE — Telephone Encounter (Signed)
Pt called - thyroid dose discussed and new rx sent in  --- fobt mailed

## 2017-11-03 NOTE — Telephone Encounter (Signed)
[name] I don't see where the dose of levothyroxine was changed. Do you know anything about this?

## 2017-11-10 ENCOUNTER — Other Ambulatory Visit: Payer: Self-pay | Admitting: *Deleted

## 2017-11-10 MED ORDER — LOVASTATIN 40 MG PO TABS: ORAL_TABLET | ORAL | 0 refills | Status: DC

## 2017-11-18 ENCOUNTER — Other Ambulatory Visit: Payer: Medicare Other

## 2017-11-18 DIAGNOSIS — Z1211 Encounter for screening for malignant neoplasm of colon: Secondary | ICD-10-CM | POA: Diagnosis not present

## 2017-12-02 LAB — FECAL OCCULT BLOOD, IMMUNOCHEMICAL: FECAL OCCULT BLD: NEGATIVE

## 2017-12-06 DIAGNOSIS — Z0100 Encounter for examination of eyes and vision without abnormal findings: Secondary | ICD-10-CM | POA: Diagnosis not present

## 2017-12-20 ENCOUNTER — Telehealth: Payer: Self-pay | Admitting: Family Medicine

## 2017-12-20 MED ORDER — LOSARTAN POTASSIUM 50 MG PO TABS: 100.0000 mg | ORAL_TABLET | Freq: Every day | ORAL | 3 refills | Status: DC

## 2017-12-20 NOTE — Telephone Encounter (Signed)
Patient received a letter from [facility] stating that the Losartan he was given is one of the lot numbers that has been recalled.  They are recommending he be prescribed a different medication but he would like to know your recommendation.  [facility] [place] that is listed on his chart.  He would like a 90 day supply sent in and then he can have this transferred to his mail order pharmacy.  Please advise.

## 2017-12-20 NOTE — Telephone Encounter (Signed)
Please discuss with patient's pharmacy and give appropriate angiotensin receptor blocker to replace the one he has been on.

## 2017-12-20 NOTE — Telephone Encounter (Signed)
Pt called - he is going to try the lot # for the 2- 50 mg tabs = so that we do not have to change rx at this time.

## 2018-01-04 ENCOUNTER — Other Ambulatory Visit: Payer: Self-pay | Admitting: *Deleted

## 2018-01-04 MED ORDER — AMLODIPINE BESYLATE 10 MG PO TABS: ORAL_TABLET | ORAL | 0 refills | Status: DC

## 2018-01-04 MED ORDER — LEVOTHYROXINE SODIUM 150 MCG PO TABS: 150.0000 ug | ORAL_TABLET | Freq: Every day | ORAL | 0 refills | Status: DC

## 2018-01-04 NOTE — Telephone Encounter (Signed)
Next OV 02/09/18

## 2018-01-28 ENCOUNTER — Other Ambulatory Visit: Payer: Self-pay | Admitting: Family Medicine

## 2018-02-09 ENCOUNTER — Ambulatory Visit: Payer: Medicare Other | Admitting: Family Medicine

## 2018-02-27 ENCOUNTER — Ambulatory Visit (INDEPENDENT_AMBULATORY_CARE_PROVIDER_SITE_OTHER): Payer: Medicare Other | Admitting: Family Medicine

## 2018-02-27 ENCOUNTER — Encounter: Payer: Self-pay | Admitting: Family Medicine

## 2018-02-27 VITALS — BP 108/66 | HR 67 | Temp 96.6°F | Ht 72.0 in | Wt 248.0 lb

## 2018-02-27 DIAGNOSIS — S20212A Contusion of left front wall of thorax, initial encounter: Secondary | ICD-10-CM

## 2018-02-27 DIAGNOSIS — I10 Essential (primary) hypertension: Secondary | ICD-10-CM

## 2018-02-27 DIAGNOSIS — N400 Enlarged prostate without lower urinary tract symptoms: Secondary | ICD-10-CM

## 2018-02-27 DIAGNOSIS — E039 Hypothyroidism, unspecified: Secondary | ICD-10-CM | POA: Diagnosis not present

## 2018-02-27 DIAGNOSIS — E7800 Pure hypercholesterolemia, unspecified: Secondary | ICD-10-CM

## 2018-02-27 DIAGNOSIS — E559 Vitamin D deficiency, unspecified: Secondary | ICD-10-CM

## 2018-02-27 DIAGNOSIS — N183 Chronic kidney disease, stage 3 unspecified: Secondary | ICD-10-CM

## 2018-02-27 DIAGNOSIS — N289 Disorder of kidney and ureter, unspecified: Secondary | ICD-10-CM

## 2018-02-27 DIAGNOSIS — Z8249 Family history of ischemic heart disease and other diseases of the circulatory system: Secondary | ICD-10-CM

## 2018-02-27 DIAGNOSIS — I129 Hypertensive chronic kidney disease with stage 1 through stage 4 chronic kidney disease, or unspecified chronic kidney disease: Secondary | ICD-10-CM | POA: Diagnosis not present

## 2018-02-27 DIAGNOSIS — N5201 Erectile dysfunction due to arterial insufficiency: Secondary | ICD-10-CM

## 2018-02-27 MED ORDER — SILDENAFIL CITRATE 20 MG PO TABS: ORAL_TABLET | ORAL | 1 refills | Status: DC

## 2018-02-27 NOTE — Progress Notes (Signed)
Subjective:    [name], male    DOB: 31-Jan-1941, 77 y.o.   MRN: [id number]  HPI Pt here for follow up and management of chronic medical problems which includes hypertension and hyperlipidemia. He is taking medication regularly.  The patient has had a recent fall and complains of left rib pain and soreness.  He is requesting a refill on his sildenafil.  He will get lab work today.  His weight is down 10 pounds and this is great.  He still has a body mass index of 34.99.  His vital signs are stable.  This patient takes thyroid replacement antihypertensive meds lovastatin and omega-3 fatty acids.  He also takes medicine for erectile dysfunction.  The patient is doing well overall.  He did have a treadmill test by the cardiologist and did well with this.  Was done in June 2018.  This was normal with no evidence of ischemia.  The patient denies any chest pain pressure tightness or shortness of breath.  He denies any trouble with swallowing heartburn indigestion nausea vomiting diarrhea blood in the stool or black tarry bowel movements.  He is passing his water without problems.  He still has problems with erectile dysfunction.  He had this fall and his left chest wall has been somewhat sore but it is less sore than previously.  This was about 2 weeks ago.    Patient Active Problem List   Diagnosis Date Noted  . SOB (shortness of breath) 03/10/2017  . Dyslipidemia 03/10/2017  . Overweight 03/10/2017  . Metabolic syndrome 31/51/7616  . Hyperlipidemia 01/31/2013  . Hypothyroid 01/31/2013  . BPH (benign prostatic hyperplasia) 01/31/2013  . CKD (chronic kidney disease) 01/31/2013  . HYPERTENSION, CONTROLLED 02/03/2010   Outpatient Encounter Medications as of 02/27/2018  Medication Sig  . amLODipine (NORVASC) 10 MG tablet TAKE 1 TABLET BY MOUTH DAILY  . aspirin 81 MG chewable tablet Chew 81 mg by mouth daily.  . Cholecalciferol (HM VITAMIN D3) 4000 UNITS CAPS Take 2,000 Units by mouth  daily.   Marland Kitchen levothyroxine (SYNTHROID, LEVOTHROID) 150 MCG tablet Take 1 tablet (150 mcg total) by mouth daily.  Marland Kitchen levothyroxine (SYNTHROID, LEVOTHROID) 25 MCG tablet Take 1- 1.5 tabs po daily as directed  . losartan (COZAAR) 50 MG tablet Take 2 tablets (100 mg total) by mouth daily.  Marland Kitchen lovastatin (MEVACOR) 40 MG tablet TAKE 1 TABLET BY MOUTH AT BEDTIME  . Omega-3 Fatty Acids (FISH OIL) 1200 MG CAPS Take 1,000 mg by mouth daily.   . sildenafil (REVATIO) 20 MG tablet Take 2-5 tabs PRN prior to sexual activity   No facility-administered encounter medications on file as of 02/27/2018.      Review of Systems  Constitutional: Negative.   HENT: Negative.   Eyes: Negative.   Respiratory: Negative.   Cardiovascular: Negative.   Gastrointestinal: Negative.   Endocrine: Negative.   Genitourinary: Negative.   Musculoskeletal: Positive for arthralgias (LEFT RIB PAIN).  Skin: Negative.   Allergic/Immunologic: Negative.   Neurological: Negative.   Hematological: Negative.   Psychiatric/Behavioral: Negative.        Objective:   Physical Exam  Constitutional: He is oriented to person, place, and time. He appears well-developed and well-nourished. No distress.  The patient is pleasant and relaxed has a good handle on his health issues and especially is doing better with exercise with weight loss.  HENT:  Head: Normocephalic and atraumatic.  Right Ear: External ear normal.  Left Ear: External ear normal.  Nose: Nose normal.  Mouth/Throat: Oropharynx is clear and moist. No oropharyngeal exudate.  Eyes: Pupils are equal, round, and reactive to light. Conjunctivae and EOM are normal. Right eye exhibits no discharge. Left eye exhibits no discharge.  Neck: Normal range of motion. Neck supple. No thyromegaly present.  No bruits thyromegaly or anterior cervical adenopathy  Cardiovascular: Normal rate, regular rhythm, normal heart sounds and intact distal pulses.  No murmur heard. The heart is regular  at 72/min  Pulmonary/Chest: Effort normal and breath sounds normal. He has no wheezes. He has no rales. He exhibits tenderness.  Clear anteriorly and posteriorly no chest wall masses no axillary adenopathy.  There is slight tenderness in the left lateral chest wall.  Abdominal: Soft. Bowel sounds are normal. He exhibits no mass. There is no tenderness. There is no rebound and no guarding.  No liver or spleen enlargement.  No epigastric tenderness no bruits and no inguinal adenopathy.  Musculoskeletal: Normal range of motion. He exhibits no edema or tenderness.  Some tenderness and soreness in the left lower anterior lateral chest wall  Lymphadenopathy:    He has no cervical adenopathy.  Neurological: He is alert and oriented to person, place, and time. He has normal reflexes. No cranial nerve deficit.  Skin: Skin is warm and dry. No rash noted.  Psychiatric: He has a normal mood and affect. His behavior is normal. Judgment and thought content normal.  Patient is alert with good memory and good historian  Nursing note and vitals reviewed.  BP 108/66 (BP Location: Left Arm)   Pulse 67   Temp (!) 96.6 F (35.9 C) (Oral)   Ht 6' (1.829 m)   Wt 248 lb (112.5 kg)   BMI 33.63 kg/m         Assessment & Plan:  1. HYPERTENSION, CONTROLLED -Continue current treatment and aggressive therapeutic lifestyle changes - BMP8+EGFR - CBC with Differential/Platelet - Hepatic function panel  2. Pure hypercholesterolemia -Continue current treatment pending results of lab work and aggressive therapeutic lifestyle changes - CBC with Differential/Platelet - Lipid panel  3. Hypothyroidism, adult -Continue current treatment pending results of lab work - CBC with Differential/Platelet - Thyroid Panel With TSH  4. Vitamin D deficiency -Continue vitamin D replacement pending results of lab work - CBC with Differential/Platelet - VITAMIN D 25 Hydroxy (Vit-D Deficiency, Fractures)  5. Benign  prostatic hyperplasia, unspecified whether lower urinary tract symptoms present -Patient today only has erectile dysfunction complaints with no symptoms with voiding. - CBC with Differential/Platelet  6. Family history of heart disease -Patient had ETT in June 2018 by the cardiologist with no signs of ischemia.  He does have a family history of heart disease.  I would recommend he see the cardiologist again in about 3 years. - CBC with Differential/Platelet - Lipid panel  7. Erectile dysfunction due to arterial insufficiency -Refill sildenafil  8. Chronic kidney disease, stage III (moderate) (HCC) -He has not seen the nephrologist recently.  The patient was reminded to avoid all NSAIDs and always keep his blood pressure under the best control possible with sodium restriction diet and exercise.  9. Contusion of left chest wall, initial encounter -Warm wet compresses to the area which appears to be getting better and this occurred over 2 weeks ago.  Meds ordered this encounter  Medications  . sildenafil (REVATIO) 20 MG tablet    Sig: Take 2-5 tabs PRN prior to sexual activity    Dispense:  50 tablet    Refill:  1   Patient Instructions                       Medicare Annual Wellness Visit  [facility] and the medical providers at [facility] strive to bring you the best medical care.  In doing so we not only want to address your current medical conditions and concerns but also to detect new conditions early and prevent illness, disease and health-related problems.    Medicare offers a yearly Wellness Visit which allows our clinical staff to assess your need for preventative services including immunizations, lifestyle education, counseling to decrease risk of preventable diseases and screening for fall risk and other medical concerns.    This visit is provided free of charge (no copay) for all Medicare recipients. The clinical pharmacists at [facility] have begun to conduct these Wellness Visits which will also include a thorough review of all your medications.    As you primary medical provider recommend that you make an appointment for your Annual Wellness Visit if you have not done so already this year.  You may set up this appointment before you leave today or you may call back ([phone]) and schedule an appointment.  Please make sure when you call that you mention that you are scheduling your Annual Wellness Visit with the clinical pharmacist so that the appointment may be made for the proper length of time.     Continue current medications. Continue good therapeutic lifestyle changes which include good diet and exercise. Fall precautions discussed with patient. If an FOBT was given today- please return it to our front desk. If you are over 60 years old - you may need Prevnar 55 or the adult Pneumonia vaccine.  **Flu shots are available--- please call and schedule a FLU-CLINIC appointment**  After your visit with [place] today you will receive a survey in the mail or online from [employer] regarding your care with [place]. Please take a moment to fill this out. Your feedback is very important to [place] as you can help [place] better understand your patient needs as well as improve your experience and satisfaction. WE CARE ABOUT YOU!!!   Continue to work aggressively on weight with diet and exercise Always avoid NSAIDs as these can aggravate kidney function test. Follow diet closely. Use warm wet compresses to the left chest wall 20 minutes 3 or 4 times daily.  Take Tylenol if needed for aches or pains. We will call with results of lab work as soon as these results become available If you do not hear from the gastroenterologist next spring, you may want to call and check and find out when your next colonoscopy is due if you are due to get another one.  We will continue to do your fecal occult blood test cards yearly.  If you do not do another  colonoscopy, you may want to consider getting a Cologuard done in the future.   Arrie Senate MD

## 2018-02-27 NOTE — Patient Instructions (Addendum)
Medicare Annual Wellness Visit  [facility] and the medical providers at [facility] strive to bring you the best medical care.  In doing so we not only want to address your current medical conditions and concerns but also to detect new conditions early and prevent illness, disease and health-related problems.    Medicare offers a yearly Wellness Visit which allows our clinical staff to assess your need for preventative services including immunizations, lifestyle education, counseling to decrease risk of preventable diseases and screening for fall risk and other medical concerns.    This visit is provided free of charge (no copay) for all Medicare recipients. The clinical pharmacists at [facility] have begun to conduct these Wellness Visits which will also include a thorough review of all your medications.    As you primary medical provider recommend that you make an appointment for your Annual Wellness Visit if you have not done so already this year.  You may set up this appointment before you leave today or you may call back [phone]) and schedule an appointment.  Please make sure when you call that you mention that you are scheduling your Annual Wellness Visit with the clinical pharmacist so that the appointment may be made for the proper length of time.     Continue current medications. Continue good therapeutic lifestyle changes which include good diet and exercise. Fall precautions discussed with patient. If an FOBT was given today- please return it to our front desk. If you are over 24 years old - you may need Prevnar 13 or the adult Pneumonia vaccine.  **Flu shots are available--- please call and schedule a FLU-CLINIC appointment**  After your visit with [place] today you will receive a survey in the mail or online from [employer] regarding your care with [place]. Please take a moment to fill this out. Your feedback is very  important to [place] as you can help [place] better understand your patient needs as well as improve your experience and satisfaction. WE CARE ABOUT YOU!!!   Continue to work aggressively on weight with diet and exercise Always avoid NSAIDs as these can aggravate kidney function test. Follow diet closely. Use warm wet compresses to the left chest wall 20 minutes 3 or 4 times daily.  Take Tylenol if needed for aches or pains. We will call with results of lab work as soon as these results become available If you do not hear from the gastroenterologist next spring, you may want to call and check and find out when your next colonoscopy is due if you are due to get another one.  We will continue to do your fecal occult blood test cards yearly.  If you do not do another colonoscopy, you may want to consider getting a Cologuard done in the future.

## 2018-02-28 ENCOUNTER — Other Ambulatory Visit: Payer: Self-pay

## 2018-02-28 DIAGNOSIS — N289 Disorder of kidney and ureter, unspecified: Secondary | ICD-10-CM

## 2018-02-28 LAB — HEPATIC FUNCTION PANEL
ALK PHOS: 73 IU/L (ref 39–117)
ALT: 31 IU/L (ref 0–44)
AST: 32 IU/L (ref 0–40)
Albumin: 4.5 g/dL (ref 3.5–4.8)
Bilirubin Total: 0.8 mg/dL (ref 0.0–1.2)
Bilirubin, Direct: 0.22 mg/dL (ref 0.00–0.40)
Total Protein: 7 g/dL (ref 6.0–8.5)

## 2018-02-28 LAB — LIPID PANEL
Chol/HDL Ratio: 2.4 ratio (ref 0.0–5.0)
Cholesterol, Total: 94 mg/dL — ABNORMAL LOW (ref 100–199)
HDL: 40 mg/dL (ref >39)
LDL CALC: 36 mg/dL (ref 0–99)
TRIGLYCERIDES: 88 mg/dL (ref 0–149)
VLDL CHOLESTEROL CAL: 18 mg/dL (ref 5–40)

## 2018-02-28 LAB — CBC WITH DIFFERENTIAL/PLATELET
BASOS ABS: 0.1 10*3/uL (ref 0.0–0.2)
BASOS: 1 %
EOS (ABSOLUTE): 0.2 10*3/uL (ref 0.0–0.4)
Eos: 3 %
Hematocrit: 43.5 % (ref 37.5–51.0)
Hemoglobin: 15 g/dL (ref 13.0–17.7)
IMMATURE GRANS (ABS): 0 10*3/uL (ref 0.0–0.1)
Immature Granulocytes: 0 %
LYMPHS ABS: 2.2 10*3/uL (ref 0.7–3.1)
LYMPHS: 31 %
MCH: 30.2 pg (ref 26.6–33.0)
MCHC: 34.5 g/dL (ref 31.5–35.7)
MCV: 88 fL (ref 79–97)
MONOS ABS: 0.7 10*3/uL (ref 0.1–0.9)
Monocytes: 10 %
NEUTROS ABS: 4 10*3/uL (ref 1.4–7.0)
Neutrophils: 55 %
Platelets: 260 10*3/uL (ref 150–379)
RBC: 4.96 x10E6/uL (ref 4.14–5.80)
RDW: 13.8 % (ref 12.3–15.4)
WBC: 7.2 10*3/uL (ref 3.4–10.8)

## 2018-02-28 LAB — BMP8+EGFR
BUN/Creatinine Ratio: 12 (ref 10–24)
BUN: 21 mg/dL (ref 8–27)
CALCIUM: 9.4 mg/dL (ref 8.6–10.2)
CHLORIDE: 105 mmol/L (ref 96–106)
CO2: 24 mmol/L (ref 20–29)
Creatinine, Ser: 1.79 mg/dL — ABNORMAL HIGH (ref 0.76–1.27)
GFR calc non Af Amer: 37 mL/min/{1.73_m2} — ABNORMAL LOW (ref >59)
GFR, EST AFRICAN AMERICAN: 42 mL/min/{1.73_m2} — AB (ref >59)
Glucose: 109 mg/dL — ABNORMAL HIGH (ref 65–99)
Potassium: 4.3 mmol/L (ref 3.5–5.2)
SODIUM: 144 mmol/L (ref 134–144)

## 2018-02-28 LAB — THYROID PANEL WITH TSH
Free Thyroxine Index: 3.2 (ref 1.2–4.9)
T3 UPTAKE RATIO: 30 % (ref 24–39)
T4, Total: 10.5 ug/dL (ref 4.5–12.0)
TSH: 1.93 u[IU]/mL (ref 0.450–4.500)

## 2018-02-28 LAB — VITAMIN D 25 HYDROXY (VIT D DEFICIENCY, FRACTURES): VIT D 25 HYDROXY: 38.6 ng/mL (ref 30.0–100.0)

## 2018-03-25 ENCOUNTER — Other Ambulatory Visit: Payer: Self-pay | Admitting: Family Medicine

## 2018-03-28 ENCOUNTER — Other Ambulatory Visit: Payer: Self-pay | Admitting: Family Medicine

## 2018-03-29 ENCOUNTER — Other Ambulatory Visit: Payer: Self-pay | Admitting: Family Medicine

## 2018-04-17 ENCOUNTER — Other Ambulatory Visit: Payer: Self-pay | Admitting: Family Medicine

## 2018-04-20 ENCOUNTER — Telehealth: Payer: Self-pay | Admitting: Family Medicine

## 2018-04-20 ENCOUNTER — Other Ambulatory Visit: Payer: Self-pay | Admitting: *Deleted

## 2018-04-20 DIAGNOSIS — N289 Disorder of kidney and ureter, unspecified: Secondary | ICD-10-CM

## 2018-04-20 NOTE — Telephone Encounter (Signed)
Pt aware - lab order placed for [facility] office

## 2018-04-20 NOTE — Addendum Note (Signed)
Addended by: Prescott GumLAND, Dylyn Mclaren M on: 04/20/2018 04:06 PM   Modules accepted: Orders

## 2018-04-21 LAB — CMP14+EGFR
ALBUMIN: 4.3 g/dL (ref 3.5–4.8)
ALT: 24 IU/L (ref 0–44)
AST: 24 IU/L (ref 0–40)
Albumin/Globulin Ratio: 1.8 (ref 1.2–2.2)
Alkaline Phosphatase: 73 IU/L (ref 39–117)
BILIRUBIN TOTAL: 0.7 mg/dL (ref 0.0–1.2)
BUN / CREAT RATIO: 10 (ref 10–24)
BUN: 14 mg/dL (ref 8–27)
CHLORIDE: 105 mmol/L (ref 96–106)
CO2: 23 mmol/L (ref 20–29)
CREATININE: 1.37 mg/dL — AB (ref 0.76–1.27)
Calcium: 9.1 mg/dL (ref 8.6–10.2)
GFR calc non Af Amer: 50 mL/min/{1.73_m2} — ABNORMAL LOW (ref >59)
GFR, EST AFRICAN AMERICAN: 58 mL/min/{1.73_m2} — AB (ref >59)
GLUCOSE: 99 mg/dL (ref 65–99)
Globulin, Total: 2.4 g/dL (ref 1.5–4.5)
Potassium: 3.8 mmol/L (ref 3.5–5.2)
Sodium: 142 mmol/L (ref 134–144)
TOTAL PROTEIN: 6.7 g/dL (ref 6.0–8.5)

## 2018-04-26 ENCOUNTER — Other Ambulatory Visit: Payer: Self-pay | Admitting: Family Medicine

## 2018-08-30 ENCOUNTER — Ambulatory Visit: Payer: Medicare Other | Admitting: Family Medicine

## 2018-08-30 ENCOUNTER — Encounter: Payer: Self-pay | Admitting: Family Medicine

## 2018-08-30 VITALS — BP 119/73 | HR 63 | Temp 97.4°F | Ht 72.0 in | Wt 249.0 lb

## 2018-08-30 DIAGNOSIS — R361 Hematospermia: Secondary | ICD-10-CM

## 2018-08-30 DIAGNOSIS — Z23 Encounter for immunization: Secondary | ICD-10-CM | POA: Diagnosis not present

## 2018-08-30 DIAGNOSIS — E7800 Pure hypercholesterolemia, unspecified: Secondary | ICD-10-CM | POA: Diagnosis not present

## 2018-08-30 DIAGNOSIS — E559 Vitamin D deficiency, unspecified: Secondary | ICD-10-CM

## 2018-08-30 DIAGNOSIS — E039 Hypothyroidism, unspecified: Secondary | ICD-10-CM

## 2018-08-30 DIAGNOSIS — N400 Enlarged prostate without lower urinary tract symptoms: Secondary | ICD-10-CM

## 2018-08-30 DIAGNOSIS — Z8249 Family history of ischemic heart disease and other diseases of the circulatory system: Secondary | ICD-10-CM | POA: Diagnosis not present

## 2018-08-30 DIAGNOSIS — I10 Essential (primary) hypertension: Secondary | ICD-10-CM

## 2018-08-30 LAB — URINALYSIS, COMPLETE
Bilirubin, UA: NEGATIVE
GLUCOSE, UA: NEGATIVE
Ketones, UA: NEGATIVE
Leukocytes, UA: NEGATIVE
NITRITE UA: NEGATIVE
PH UA: 6 (ref 5.0–7.5)
Specific Gravity, UA: 1.02 (ref 1.005–1.030)
UUROB: 0.2 mg/dL (ref 0.2–1.0)

## 2018-08-30 LAB — MICROSCOPIC EXAMINATION
BACTERIA UA: NONE SEEN
EPITHELIAL CELLS (NON RENAL): NONE SEEN /HPF (ref 0–10)
Renal Epithel, UA: NONE SEEN /hpf
WBC UA: NONE SEEN /HPF (ref 0–5)

## 2018-08-30 NOTE — Addendum Note (Signed)
Addended by: Magdalene River on: 08/30/2018 12:07 PM   Modules accepted: Orders

## 2018-08-30 NOTE — Progress Notes (Signed)
Subjective:    [name], male    DOB: 1940/11/26, 77 y.o.   MRN: [id number]  HPI Pt here for follow up and management of chronic medical problems which includes hypertension, hyperlipidemia and hypothyroid. He is taking medication regularly.  The patient today has a history of chronic kidney disease BPH dyslipidemia hypertension hypothyroidism and metabolic syndrome.  He complains today with dark brown slightly red-tinged discharge from the penis.  He also has a skin lesion on the left arm that he is concerned about.  We will get the urine before doing a rectal exam.  His blood pressure is good and his weight is stable and up 1 pound since the last visit.  His body mass index is 33.63.  The patient is pleasant and alert today.  He is not having any major complaints.  He denies any chest pain or shortness of breath and is active daily.  He denies any trouble with his stomach including nausea vomiting diarrhea blood in the stool or black tarry bowel movements.  He has no trouble with swallowing or heartburn.  He is passing his water with out problems and avoids drinking a lot of water at nighttime.  He has noticed a stain in his underwear that was dark in color occasionally.  He did notice that he had some dark reddish-brown semen on one occasion.  He is going to check about if and when he needs another colonoscopy.  He is also going to check as far as when he gets his follow-up visits with the cardiologist.  He denies any burning pain or frequency with passing his water.  He may have some occasional incontinence.    Patient Active Problem List   Diagnosis Date Noted  . SOB (shortness of breath) 03/10/2017  . Dyslipidemia 03/10/2017  . Overweight 03/10/2017  . Metabolic syndrome 93/79/0240  . Hyperlipidemia 01/31/2013  . Hypothyroid 01/31/2013  . BPH (benign prostatic hyperplasia) 01/31/2013  . CKD (chronic kidney disease) 01/31/2013  . HYPERTENSION, CONTROLLED 02/03/2010    Outpatient Encounter Medications as of 08/30/2018  Medication Sig  . amLODipine (NORVASC) 10 MG tablet TAKE 1 TABLET BY MOUTH EVERY DAY  . Cholecalciferol (HM VITAMIN D3) 4000 UNITS CAPS Take 2,000 Units by mouth daily.   Marland Kitchen levothyroxine (SYNTHROID, LEVOTHROID) 150 MCG tablet TAKE 1 TABLET BY MOUTH EVERY DAY  . levothyroxine (SYNTHROID, LEVOTHROID) 25 MCG tablet Take 1- 1.5 tabs po daily as directed  . losartan (COZAAR) 50 MG tablet Take 2 tablets (100 mg total) by mouth daily.  Marland Kitchen lovastatin (MEVACOR) 40 MG tablet TAKE 1 TABLET BY MOUTH AT BEDTIME  . Omega-3 Fatty Acids (FISH OIL) 1200 MG CAPS Take 1,000 mg by mouth daily.   . sildenafil (REVATIO) 20 MG tablet Take 2-5 tabs PRN prior to sexual activity  . [DISCONTINUED] aspirin 81 MG chewable tablet Chew 81 mg by mouth daily.   No facility-administered encounter medications on file as of 08/30/2018.       Review of Systems  Constitutional: Negative.   HENT: Negative.   Eyes: Negative.   Respiratory: Negative.   Cardiovascular: Negative.   Gastrointestinal: Negative.   Endocrine: Negative.   Genitourinary: Positive for discharge (dk brownish / red ).  Musculoskeletal: Negative.   Skin: Negative.        Left lower arm - skin lesion   Allergic/Immunologic: Negative.   Neurological: Negative.   Hematological: Negative.   Psychiatric/Behavioral: Negative.        Objective:  Physical Exam  Constitutional: He is oriented to person, place, and time. He appears well-developed and well-nourished. No distress.  The patient is pleasant and alert and informative about his health and his medical condition.  HENT:  Head: Normocephalic and atraumatic.  Right Ear: External ear normal.  Left Ear: External ear normal.  Nose: Nose normal.  Mouth/Throat: Oropharynx is clear and moist. No oropharyngeal exudate.  Eyes: Pupils are equal, round, and reactive to light. Conjunctivae and EOM are normal. Right eye exhibits no discharge. Left eye  exhibits no discharge. No scleral icterus.  Neck: Normal range of motion. Neck supple. No thyromegaly present.  No bruits thyromegaly or anterior cervical adenopathy  Cardiovascular: Normal rate, regular rhythm, normal heart sounds and intact distal pulses.  No murmur heard. Heart is regular at 72/min with excellent pedal pulses and no edema  Pulmonary/Chest: Effort normal and breath sounds normal. He has no wheezes. He has no rales. He exhibits no tenderness.  Clear anteriorly and posteriorly and no axillary adenopathy or chest wall masses.  Abdominal: Soft. Bowel sounds are normal. He exhibits no mass. There is no tenderness. There is no rebound and no guarding.  Mild obesity without liver or spleen enlargement epigastric tenderness bruits or inguinal adenopathy.  No suprapubic tenderness.  Genitourinary: Rectum normal and penis normal.  Genitourinary Comments: The penis appeared normal.  There was no sign of any urethral irritation.  The prostate exam had a large but smooth prostate with the right side being larger than the left but it was smooth and there were no lumps or masses.  There were no rectal masses.  The external genitalia were within normal limits with no inguinal hernias being palpated.  Testicles were normal.  Musculoskeletal: Normal range of motion. He exhibits no edema.  Lymphadenopathy:    He has no cervical adenopathy.  Neurological: He is alert and oriented to person, place, and time. He has normal reflexes. No cranial nerve deficit.  Skin: Skin is warm and dry. No rash noted.  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 Michele Mcalpine as needed  Psychiatric: He has a normal mood and affect. His behavior is normal. Judgment and thought content normal.  The patient's mood affect and behavior were all normal.  Nursing note and vitals reviewed.  BP 119/73 (BP Location: Left Arm)   Pulse 63   Temp (!) 97.4 F (36.3 C) (Oral)   Ht 6' (1.829  m)   Wt 249 lb (112.9 kg)   BMI 33.77 kg/m         Assessment & Plan:  1. HYPERTENSION, CONTROLLED -The blood pressure is good today and he will continue with current treatment - BMP8+EGFR - CBC with Differential/Platelet - Hepatic function panel - EKG 12-Lead  2. Pure hypercholesterolemia -Continue with current treatment and as aggressive therapeutic lifestyle changes as possible to achieve weight loss through diet and exercise - CBC with Differential/Platelet - Lipid panel - EKG 12-Lead  3. Hypothyroidism, adult -Continue thyroid replacement pending results of lab work - CBC with Differential/Platelet  4. Vitamin D deficiency -Continue with vitamin D replacement pending results of lab work - CBC with Differential/Platelet - VITAMIN D 25 Hydroxy (Vit-D Deficiency, Fractures)  5. Benign prostatic hyperplasia, unspecified whether lower urinary tract symptoms present -The prostate remains enlarged but soft and smooth.  The right side appears to be bigger than the left side.  Once again no lumps or masses.  There were no rectal masses. -  CBC with Differential/Platelet - PSA, total and free - Urinalysis, Complete  6. Family history of heart disease -Continue to follow-up every couple of years with cardiology. - CBC with Differential/Platelet - EKG 12-Lead  7. Hemospermia -This diagnosis is used based on the patient's history. -We will check 2 urines today one prior to the rectal exam and one after the rectal exam.  There is no discharge from massaging the prostate.  If there are any increased red blood cells or if the PSA is elevated we will consider referral to urology at least for a one-time evaluation.  Patient Instructions                       Medicare Annual Wellness Visit  [facility] and the medical providers at [facility] strive to bring you the best medical care.  In doing so we not only want to address your current medical conditions  and concerns but also to detect new conditions early and prevent illness, disease and health-related problems.    Medicare offers a yearly Wellness Visit which allows our clinical staff to assess your need for preventative services including immunizations, lifestyle education, counseling to decrease risk of preventable diseases and screening for fall risk and other medical concerns.    This visit is provided free of charge (no copay) for all Medicare recipients. The clinical pharmacists at [facility] have begun to conduct these Wellness Visits which will also include a thorough review of all your medications.    As you primary medical provider recommend that you make an appointment for your Annual Wellness Visit if you have not done so already this year.  You may set up this appointment before you leave today or you may call back ([phone]) and schedule an appointment.  Please make sure when you call that you mention that you are scheduling your Annual Wellness Visit with the clinical pharmacist so that the appointment may be made for the proper length of time.    Continue current medications. Continue good therapeutic lifestyle changes which include good diet and exercise. Fall precautions discussed with patient. If an FOBT was given today- please return it to our front desk. If you are over 57 years old - you may need Prevnar 88 or the adult Pneumonia vaccine.  **Flu shots are available--- please call and schedule a FLU-CLINIC appointment**  After your visit with [place] today you will receive a survey in the mail or online from [employer] regarding your care with [place]. Please take a moment to fill this out. Your feedback is very important to [place] as you can help [place] better understand your patient needs as well as improve your experience and satisfaction. WE CARE ABOUT YOU!!!   We will call with lab work results as soon as those results have been returned Check with  gastroenterology and make sure that that you are aware of any future needs for colonoscopies or not Also check with cardiology about routine returns for cardiac evaluation Continue to drink plenty of fluids and stay well-hydrated. Continue to keep regular eye visits.  We will review urine prior to rectal exam and urine after rectal exam.  If there is any sign of any red blood cells we will most likely have you see the urologist and we will arrange for that to happen in [place]. The patient should continue to drink plenty of fluids.  Arrie Senate MD

## 2018-08-30 NOTE — Patient Instructions (Addendum)
Medicare Annual Wellness Visit  [place] and the medical providers at [facility] strive to bring you the best medical care.  In doing so we not only want to address your current medical conditions and concerns but also to detect new conditions early and prevent illness, disease and health-related problems.    Medicare offers a yearly Wellness Visit which allows our clinical staff to assess your need for preventative services including immunizations, lifestyle education, counseling to decrease risk of preventable diseases and screening for fall risk and other medical concerns.    This visit is provided free of charge (no copay) for all Medicare recipients. The clinical pharmacists at [facility] have begun to conduct these Wellness Visits which will also include a thorough review of all your medications.    As you primary medical provider recommend that you make an appointment for your Annual Wellness Visit if you have not done so already this year.  You may set up this appointment before you leave today or you may call back ([phone]) and schedule an appointment.  Please make sure when you call that you mention that you are scheduling your Annual Wellness Visit with the clinical pharmacist so that the appointment may be made for the proper length of time.    Continue current medications. Continue good therapeutic lifestyle changes which include good diet and exercise. Fall precautions discussed with patient. If an FOBT was given today- please return it to our front desk. If you are over 12 years old - you may need Prevnar 13 or the adult Pneumonia vaccine.  **Flu shots are available--- please call and schedule a FLU-CLINIC appointment**  After your visit with [place] today you will receive a survey in the mail or online from [employer] regarding your care with [place]. Please take a moment to fill this out. Your feedback is very  important to [place] as you can help [place] better understand your patient needs as well as improve your experience and satisfaction. WE CARE ABOUT YOU!!!   We will call with lab work results as soon as those results have been returned Check with gastroenterology and make sure that that you are aware of any future needs for colonoscopies or not Also check with cardiology about routine returns for cardiac evaluation Continue to drink plenty of fluids and stay well-hydrated. Continue to keep regular eye visits.  We will review urine prior to rectal exam and urine after rectal exam.  If there is any sign of any red blood cells we will most likely have you see the urologist and we will arrange for that to happen in [place]. The patient should continue to drink plenty of fluids.

## 2018-08-31 LAB — PSA, TOTAL AND FREE
PSA FREE PCT: 38.5 %
PSA, Free: 1.04 ng/mL
Prostate Specific Ag, Serum: 2.7 ng/mL (ref 0.0–4.0)

## 2018-08-31 LAB — CBC WITH DIFFERENTIAL/PLATELET
BASOS ABS: 0.1 10*3/uL (ref 0.0–0.2)
BASOS: 1 %
EOS (ABSOLUTE): 0.3 10*3/uL (ref 0.0–0.4)
EOS: 4 %
HEMATOCRIT: 44.7 % (ref 37.5–51.0)
Hemoglobin: 15.6 g/dL (ref 13.0–17.7)
Immature Grans (Abs): 0 10*3/uL (ref 0.0–0.1)
Immature Granulocytes: 0 %
Lymphocytes Absolute: 2.3 10*3/uL (ref 0.7–3.1)
Lymphs: 29 %
MCH: 29.9 pg (ref 26.6–33.0)
MCHC: 34.9 g/dL (ref 31.5–35.7)
MCV: 86 fL (ref 79–97)
MONOS ABS: 0.8 10*3/uL (ref 0.1–0.9)
Monocytes: 11 %
NEUTROS ABS: 4.4 10*3/uL (ref 1.4–7.0)
NEUTROS PCT: 55 %
PLATELETS: 302 10*3/uL (ref 150–450)
RBC: 5.22 x10E6/uL (ref 4.14–5.80)
RDW: 12.6 % (ref 12.3–15.4)
WBC: 7.8 10*3/uL (ref 3.4–10.8)

## 2018-08-31 LAB — URINE CULTURE

## 2018-08-31 LAB — HEPATIC FUNCTION PANEL
ALBUMIN: 4.5 g/dL (ref 3.5–4.8)
ALT: 23 IU/L (ref 0–44)
AST: 24 IU/L (ref 0–40)
Alkaline Phosphatase: 72 IU/L (ref 39–117)
BILIRUBIN TOTAL: 0.8 mg/dL (ref 0.0–1.2)
BILIRUBIN, DIRECT: 0.24 mg/dL (ref 0.00–0.40)
Total Protein: 6.8 g/dL (ref 6.0–8.5)

## 2018-08-31 LAB — LIPID PANEL
CHOL/HDL RATIO: 2.5 ratio (ref 0.0–5.0)
Cholesterol, Total: 103 mg/dL (ref 100–199)
HDL: 42 mg/dL (ref >39)
LDL Calculated: 46 mg/dL (ref 0–99)
TRIGLYCERIDES: 75 mg/dL (ref 0–149)
VLDL Cholesterol Cal: 15 mg/dL (ref 5–40)

## 2018-08-31 LAB — BMP8+EGFR
BUN/Creatinine Ratio: 12 (ref 10–24)
BUN: 18 mg/dL (ref 8–27)
CO2: 24 mmol/L (ref 20–29)
CREATININE: 1.51 mg/dL — AB (ref 0.76–1.27)
Calcium: 9.3 mg/dL (ref 8.6–10.2)
Chloride: 102 mmol/L (ref 96–106)
GFR, EST AFRICAN AMERICAN: 51 mL/min/{1.73_m2} — AB (ref >59)
GFR, EST NON AFRICAN AMERICAN: 45 mL/min/{1.73_m2} — AB (ref >59)
Glucose: 106 mg/dL — ABNORMAL HIGH (ref 65–99)
Potassium: 3.8 mmol/L (ref 3.5–5.2)
Sodium: 142 mmol/L (ref 134–144)

## 2018-08-31 LAB — VITAMIN D 25 HYDROXY (VIT D DEFICIENCY, FRACTURES): Vit D, 25-Hydroxy: 34.9 ng/mL (ref 30.0–100.0)

## 2018-09-01 ENCOUNTER — Other Ambulatory Visit: Payer: Self-pay | Admitting: *Deleted

## 2018-09-01 DIAGNOSIS — R361 Hematospermia: Secondary | ICD-10-CM

## 2018-10-04 ENCOUNTER — Other Ambulatory Visit: Payer: Self-pay | Admitting: *Deleted

## 2018-10-04 MED ORDER — LOVASTATIN 40 MG PO TABS: 40.0000 mg | ORAL_TABLET | Freq: Every day | ORAL | 1 refills | Status: DC

## 2018-10-30 DIAGNOSIS — Z0120 Encounter for dental examination and cleaning without abnormal findings: Secondary | ICD-10-CM | POA: Diagnosis not present

## 2018-11-08 ENCOUNTER — Telehealth: Payer: Self-pay | Admitting: Family Medicine

## 2018-11-08 NOTE — Telephone Encounter (Signed)
What is the name of the medication? amLODipine (NORVASC) 10 MG tablet  Have you contacted your pharmacy to request a refill? yes  Which pharmacy would you like this sent to? [facility] [phone]   Patient notified that their request is being sent to the clinical staff for review and that they should receive a call once it is complete. If they do not receive a call within 24 hours they can check with their pharmacy or our office.

## 2018-11-09 DIAGNOSIS — R361 Hematospermia: Secondary | ICD-10-CM | POA: Diagnosis not present

## 2018-11-09 DIAGNOSIS — N401 Enlarged prostate with lower urinary tract symptoms: Secondary | ICD-10-CM | POA: Diagnosis not present

## 2018-11-09 DIAGNOSIS — R351 Nocturia: Secondary | ICD-10-CM | POA: Diagnosis not present

## 2018-11-09 MED ORDER — AMLODIPINE BESYLATE 10 MG PO TABS: 10.0000 mg | ORAL_TABLET | Freq: Every day | ORAL | 0 refills | Status: DC

## 2018-11-09 NOTE — Telephone Encounter (Signed)
done

## 2018-12-07 DIAGNOSIS — H26493 Other secondary cataract, bilateral: Secondary | ICD-10-CM | POA: Diagnosis not present

## 2018-12-07 DIAGNOSIS — H04123 Dry eye syndrome of bilateral lacrimal glands: Secondary | ICD-10-CM | POA: Diagnosis not present

## 2018-12-07 DIAGNOSIS — H524 Presbyopia: Secondary | ICD-10-CM | POA: Diagnosis not present

## 2018-12-24 ENCOUNTER — Other Ambulatory Visit: Payer: Self-pay | Admitting: Family Medicine

## 2019-01-06 ENCOUNTER — Other Ambulatory Visit: Payer: Self-pay | Admitting: Family Medicine

## 2019-02-04 ENCOUNTER — Other Ambulatory Visit: Payer: Self-pay | Admitting: Family Medicine

## 2019-02-05 NOTE — Telephone Encounter (Signed)
Last seen 08/30/18  Dr Christell Constant

## 2019-02-28 ENCOUNTER — Ambulatory Visit: Payer: Medicare Other | Admitting: Family Medicine

## 2019-03-24 ENCOUNTER — Other Ambulatory Visit: Payer: Self-pay | Admitting: Family Medicine

## 2019-03-26 NOTE — Telephone Encounter (Signed)
I do not see the levothyroxine 150 on list I tried calling patient and both numbers are not working.

## 2019-03-29 ENCOUNTER — Other Ambulatory Visit: Payer: Self-pay | Admitting: Family Medicine

## 2019-04-02 ENCOUNTER — Telehealth: Payer: Self-pay | Admitting: Family Medicine

## 2019-04-02 NOTE — Telephone Encounter (Signed)
Needs refills

## 2019-04-02 NOTE — Telephone Encounter (Signed)
Refill all meds needing refills

## 2019-04-03 MED ORDER — AMLODIPINE BESYLATE 10 MG PO TABS: 10.0000 mg | ORAL_TABLET | Freq: Every day | ORAL | 0 refills | Status: DC

## 2019-04-03 MED ORDER — LOVASTATIN 40 MG PO TABS: 40.0000 mg | ORAL_TABLET | Freq: Every day | ORAL | 1 refills | Status: DC

## 2019-04-03 NOTE — Addendum Note (Signed)
Addended by: Marin Olp on: 04/03/2019 10:21 AM   Modules accepted: Orders

## 2019-05-21 ENCOUNTER — Ambulatory Visit: Payer: Medicare Other | Admitting: Family Medicine

## 2019-06-12 ENCOUNTER — Other Ambulatory Visit: Payer: Self-pay | Admitting: Family Medicine

## 2019-06-12 NOTE — Telephone Encounter (Signed)
Former [name] pt. Needs to establish w/ new PCP. Last TSH 02/27/18. Last OV 08/30/18.

## 2019-06-13 ENCOUNTER — Other Ambulatory Visit: Payer: Self-pay | Admitting: *Deleted

## 2019-06-13 NOTE — Telephone Encounter (Signed)
One phone number is not working and the other does not accept Higher education careers adviser.  Letter will be mailed.

## 2019-06-13 NOTE — Telephone Encounter (Signed)
Letter mailed requesting patient to schedule an appointment to establish with new provider.   Refills will be denied.

## 2019-07-03 ENCOUNTER — Other Ambulatory Visit: Payer: Self-pay

## 2019-07-04 ENCOUNTER — Encounter: Payer: Self-pay | Admitting: Family Medicine

## 2019-07-04 ENCOUNTER — Ambulatory Visit (INDEPENDENT_AMBULATORY_CARE_PROVIDER_SITE_OTHER): Payer: Medicare Other | Admitting: Family Medicine

## 2019-07-04 VITALS — BP 138/88 | HR 68 | Temp 98.6°F | Ht 72.0 in | Wt 261.2 lb

## 2019-07-04 DIAGNOSIS — N183 Chronic kidney disease, stage 3 unspecified: Secondary | ICD-10-CM

## 2019-07-04 DIAGNOSIS — Z0000 Encounter for general adult medical examination without abnormal findings: Secondary | ICD-10-CM

## 2019-07-04 DIAGNOSIS — E039 Hypothyroidism, unspecified: Secondary | ICD-10-CM | POA: Diagnosis not present

## 2019-07-04 DIAGNOSIS — Z23 Encounter for immunization: Secondary | ICD-10-CM | POA: Diagnosis not present

## 2019-07-04 DIAGNOSIS — E7800 Pure hypercholesterolemia, unspecified: Secondary | ICD-10-CM

## 2019-07-04 DIAGNOSIS — Z0001 Encounter for general adult medical examination with abnormal findings: Secondary | ICD-10-CM

## 2019-07-04 DIAGNOSIS — I10 Essential (primary) hypertension: Secondary | ICD-10-CM

## 2019-07-04 MED ORDER — AMLODIPINE BESYLATE 10 MG PO TABS: 10.0000 mg | ORAL_TABLET | Freq: Every day | ORAL | 3 refills | Status: DC

## 2019-07-04 MED ORDER — LOVASTATIN 40 MG PO TABS: 40.0000 mg | ORAL_TABLET | Freq: Every day | ORAL | 3 refills | Status: DC

## 2019-07-04 MED ORDER — LOSARTAN POTASSIUM 100 MG PO TABS: 100.0000 mg | ORAL_TABLET | Freq: Every day | ORAL | 3 refills | Status: DC

## 2019-07-04 MED ORDER — LEVOTHYROXINE SODIUM 175 MCG PO TABS: 175.0000 ug | ORAL_TABLET | Freq: Every day | ORAL | 1 refills | Status: DC

## 2019-07-04 MED ORDER — SHINGRIX 50 MCG/0.5ML IM SUSR: 0.5000 mL | Freq: Once | INTRAMUSCULAR | 0 refills | Status: AC

## 2019-07-04 NOTE — Patient Instructions (Signed)
[facility] Phone: [phone]   Preventive Care 65 Years and Older, Male Preventive care refers to lifestyle choices and visits with your health care provider that can promote health and wellness. This includes:  A yearly physical exam. This is also called an annual well check.  Regular dental and eye exams.  Immunizations.  Screening for certain conditions.  Healthy lifestyle choices, such as diet and exercise. What can I expect for my preventive care visit? Physical exam Your health care provider will check:  Height and weight. These may be used to calculate body mass index (BMI), which is a measurement that tells if you are at a healthy weight.  Heart rate and blood pressure.  Your skin for abnormal spots. Counseling Your health care provider may ask you questions about:  Alcohol, tobacco, and drug use.  Emotional well-being.  Home and relationship well-being.  Sexual activity.  Eating habits.  History of falls.  Memory and ability to understand (cognition).  Work and work Statistician. What immunizations do I need?  Influenza (flu) vaccine  This is recommended every year. Tetanus, diphtheria, and pertussis (Tdap) vaccine  You may need a Td booster every 10 years. Varicella (chickenpox) vaccine  You may need this vaccine if you have not already been vaccinated. Zoster (shingles) vaccine  You may need this after age 109. Pneumococcal conjugate (PCV13) vaccine  One dose is recommended after age 62. Pneumococcal polysaccharide (PPSV23) vaccine  One dose is recommended after age 94. Measles, mumps, and rubella (MMR) vaccine  You may need at least one dose of MMR if you were born in 1957 or later. You may also need a second dose. Meningococcal conjugate (MenACWY) vaccine  You may need this if you have certain conditions. Hepatitis A vaccine  You may need this if you have certain conditions or if you travel or work in  places where you may be exposed to hepatitis A. Hepatitis B vaccine  You may need this if you have certain conditions or if you travel or work in places where you may be exposed to hepatitis B. Haemophilus influenzae type b (Hib) vaccine  You may need this if you have certain conditions. You may receive vaccines as individual doses or as more than one vaccine together in one shot (combination vaccines). Talk with your health care provider about the risks and benefits of combination vaccines. What tests do I need? Blood tests  Lipid and cholesterol levels. These may be checked every 5 years, or more frequently depending on your overall health.  Hepatitis C test.  Hepatitis B test. Screening  Lung cancer screening. You may have this screening every year starting at age 34 if you have a 30-pack-year history of smoking and currently smoke or have quit within the past 15 years.  Colorectal cancer screening. All adults should have this screening starting at age 72 and continuing until age 79. Your health care provider may recommend screening at age 61 if you are at increased risk. You will have tests every 1-10 years, depending on your results and the type of screening test.  Prostate cancer screening. Recommendations will vary depending on your family history and other risks.  Diabetes screening. This is done by checking your blood sugar (glucose) after you have not eaten for a while (fasting). You may have this done every 1-3 years.  Abdominal aortic aneurysm (AAA) screening. You may need this if you are a current or former smoker.  Sexually transmitted disease (STD)  testing. Follow these instructions at home: Eating and drinking  Eat a diet that includes fresh fruits and vegetables, whole grains, lean protein, and low-fat dairy products. Limit your intake of foods with high amounts of sugar, saturated fats, and salt.  Take vitamin and mineral supplements as recommended by your health  care provider.  Do not drink alcohol if your health care provider tells you not to drink.  If you drink alcohol: ? Limit how much you have to 0-2 drinks a day. ? Be aware of how much alcohol is in your drink. In the [place] one drink equals one 12 oz bottle of beer (355 mL), one 5 oz glass of wine (148 mL), or one 1 oz glass of hard liquor (44 mL). Lifestyle  Take daily care of your teeth and gums.  Stay active. Exercise for at least 30 minutes on 5 or more days each week.  Do not use any products that contain nicotine or tobacco, such as cigarettes, e-cigarettes, and chewing tobacco. If you need help quitting, ask your health care provider.  If you are sexually active, practice safe sex. Use a condom or other form of protection to prevent STIs (sexually transmitted infections).  Talk with your health care provider about taking a low-dose aspirin or statin. What's next?  Visit your health care provider once a year for a well check visit.  Ask your health care provider how often you should have your eyes and teeth checked.  Stay up to date on all vaccines. This information is not intended to replace advice given to you by your health care provider. Make sure you discuss any questions you have with your health care provider. Document Released: 11/07/2015 Document Revised: 10/05/2018 Document Reviewed: 10/05/2018 Elsevier Patient Education  2020 [employer].

## 2019-07-04 NOTE — Progress Notes (Signed)
Assessment & Plan:  1. Well adult exam - Preventive care education provided. UTD with pneumonia and tetanus vaccines. Shingrix sent to pharmacy for administration.  - CBC with Differential/Platelet - CMP14+EGFR - Lipid panel  2. Essential hypertension - Well controlled on current regimen.  - losartan (COZAAR) 100 MG tablet; Take 1 tablet (100 mg total) by mouth daily.  Dispense: 90 tablet; Refill: 3 - amLODipine (NORVASC) 10 MG tablet; Take 1 tablet (10 mg total) by mouth daily.  Dispense: 90 tablet; Refill: 3 - CMP14+EGFR - Lipid panel  3. Hypothyroidism, unspecified type - Well controlled on current regimen but patient is taking 2 and 3 different tablets to make up total dosage of levothyroxine each day. Five days a week he takes a 150 mcg tablets + 1.5 of the 25 mcg tablets; then two days a week he is taking 150 mcg tablet + 25 mcg tablet. We discussed today that we are going to try the 175 mcg tablets QD since he has never had it and the total weekly dosage will only decrease by 62.5 mcg.  - levothyroxine (SYNTHROID) 175 MCG tablet; Take 1 tablet (175 mcg total) by mouth daily before breakfast.  Dispense: 90 tablet; Refill: 1 - CMP14+EGFR  4. CKD (chronic kidney disease) stage 3, GFR 30-59 ml/min (HCC) - Stable. Advised to avoid Ibuprofen, Advil, Aleve, Motrin, Goody Powders, Naproxen, BC powders, Meloxicam, Diclofenac, Indomethacin and other nonsteroidal anti-inflammatory medications (NSAIDs). - CMP14+EGFR  5. Pure hypercholesterolemia - Well controlled on current regimen.  - lovastatin (MEVACOR) 40 MG tablet; Take 1 tablet (40 mg total) by mouth at bedtime.  Dispense: 90 tablet; Refill: 3 - CMP14+EGFR - Lipid panel  6. Morbid obesity ([place]) - Diet and exercise encouraged.   7. Immunization due - SHINGRIX injection; Inject 0.5 mLs into the muscle once for 1 dose.  Dispense: 0.5 mL; Refill: 0  8. Need for immunization against influenza - Flu Vaccine QUAD High Dose(Fluad)    Return in about 2 months (around 09/03/2019) for Re-check thyroid.  Hendricks Limes, MSN, APRN, FNP-C [facility]  Subjective:    [name], male    DOB: 1941/10/03, 78 y.o.   MRN: [id number]  Patient Care Team: Loman Brooklyn, FNP as PCP - General (Family Medicine)   Chief Complaint:  Chief Complaint  Patient presents with  . Medical Management of Chronic Issues    HPI: [name] is a 78 y.o. male presenting on 07/04/2019 for Medical Management of Chronic Issues  Patient has no complaints or concerns today.  New complaints: None  Social history: lives in [place] and is looking for a PCP closer to home  Relevant past medical, surgical, family and social history reviewed and updated as indicated. Interim medical history since our last visit reviewed.  Allergies and medications reviewed and updated.  DATA REVIEWED: CHART IN EPIC  ROS: Negative unless specifically indicated above in HPI.    Current Outpatient Medications:  .  amLODipine (NORVASC) 10 MG tablet, Take 1 tablet (10 mg total) by mouth daily., Disp: 90 tablet, Rfl: 3 .  cholecalciferol (VITAMIN D) 25 MCG (1000 UT) tablet, Take 1,000-2,000 Units by mouth daily., Disp: , Rfl:  .  levothyroxine (SYNTHROID) 175 MCG tablet, Take 1 tablet (175 mcg total) by mouth daily before breakfast., Disp: 90 tablet, Rfl: 1 .  losartan (COZAAR) 100 MG tablet, Take 1 tablet (100 mg total) by mouth daily., Disp: 90 tablet, Rfl: 3 .  lovastatin (MEVACOR) 40  MG tablet, Take 1 tablet (40 mg total) by mouth at bedtime., Disp: 90 tablet, Rfl: 3 .  Omega-3 Fatty Acids (FISH OIL) 1000 MG CAPS, Take 1,000 mg by mouth daily., Disp: , Rfl:  .  sildenafil (REVATIO) 20 MG tablet, Take 2-5 tabs PRN prior to sexual activity, Disp: 50 tablet, Rfl: 1 .  SHINGRIX injection, Inject 0.5 mLs into the muscle once for 1 dose., Disp: 0.5 mL, Rfl: 0  Allergies  Allergen Reactions  . Paregoric    Past Medical  History:  Diagnosis Date  . BPH (benign prostatic hypertrophy)   . CKD (chronic kidney disease)   . ED (erectile dysfunction)   . Essential hypertension, benign   . Hypothyroidism   . Right inguinal hernia     Past Surgical History:  Procedure Laterality Date  . APPENDECTOMY    . 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 HERNIA REPAIR     abd  . REPAIR EXTENSOR TENDON Left 09/07/2016   Procedure: Reconstruction PULLEY sheath left middle finger Extensor retinaculum graft;  Surgeon: Daryll Brod, MD;  Location: [facility];  Service: Orthopedics;  Laterality: Left;    Social History   Socioeconomic History  . Marital status: Married    Spouse name: Not on file  . Number of children: 3  . Years of education: Not on file  . Highest education level: Not on file  Occupational History  . Occupation: Copy  . Financial resource strain: Not on file  . Food insecurity    Worry: Not on file    Inability: Not on file  . Transportation needs    Medical: Not on file    Non-medical: Not on file  Tobacco Use  . Smoking status: Former Smoker    Packs/day: 0.70    Years: 30.00    Pack years: 21.00    Quit date: 10/25/1992    Years since quitting: 26.7  . Smokeless tobacco: Never Used  Substance and Sexual Activity  . Alcohol use: Yes    Comment: social  . Drug use: No  . Sexual activity: Not on file  Lifestyle  . Physical activity    Days per week: Not on file    Minutes per session: Not on file  . Stress: Not on file  Relationships  . Social Herbalist on phone: Not on file    Gets together: Not on file    Attends religious service: Not on file    Active member of club or organization: Not on file    Attends meetings of clubs or organizations: Not on file     Relationship status: Not on file  . Intimate partner violence    Fear of current or ex partner: Not on file    Emotionally abused: Not on file    Physically abused: Not on file    Forced sexual activity: Not on file  Other Topics Concern  . Not on file  Social History Narrative   Lives with wife.          Objective:    BP 138/88   Pulse 68   Temp 98.6 F (37 C) (Temporal)   Ht 6' (1.829 m)   Wt 261 lb 3.2 oz (118.5 kg)   SpO2 97%   BMI 35.43 kg/m  Physical Exam Vitals signs reviewed.  Constitutional:      General: He is not in acute distress.    Appearance: Normal appearance. He is obese. He is not ill-appearing, toxic-appearing or diaphoretic.  HENT:     Head: Normocephalic and atraumatic.     Right Ear: Tympanic membrane, ear canal and external ear normal. There is no impacted cerumen.     Left Ear: Tympanic membrane, ear canal and external ear normal. There is no impacted cerumen.     Nose: Nose normal. No congestion or rhinorrhea.     Mouth/Throat:     Mouth: Mucous membranes are moist.     Pharynx: Oropharynx is clear. No oropharyngeal exudate or posterior oropharyngeal erythema.  Eyes:     General: No scleral icterus.       Right eye: No discharge.        Left eye: No discharge.     Conjunctiva/sclera: Conjunctivae normal.     Pupils: Pupils are equal, round, and reactive to light.  Neck:     Musculoskeletal: Normal range of motion and neck supple. No neck rigidity or muscular tenderness.  Cardiovascular:     Rate and Rhythm: Normal rate and regular rhythm.     Heart sounds: Normal heart sounds. No murmur. No friction rub. No gallop.   Pulmonary:     Effort: Pulmonary effort is normal. No respiratory distress.     Breath sounds: Normal breath sounds. No stridor. No wheezing, rhonchi or rales.  Abdominal:     General: Abdomen is flat. Bowel sounds are normal. There is no distension.     Palpations: Abdomen is soft. There is no mass.     Tenderness: There  is no abdominal tenderness. There is no guarding or rebound.     Hernia: No hernia is present.  Musculoskeletal: Normal range of motion.     Right lower leg: No edema.     Left lower leg: No edema.  Lymphadenopathy:     Cervical: No cervical adenopathy.  Skin:    General: Skin is warm and dry.     Capillary Refill: Capillary refill takes less than 2 seconds.  Neurological:     General: No focal deficit present.     Mental Status: He is alert and oriented to person, place, and time. Mental status is at baseline.  Psychiatric:        Mood and Affect: Mood normal.        Behavior: Behavior normal.        Thought Content: Thought content normal.        Judgment: Judgment normal.     Lab Results  Component Value Date   TSH 1.930 02/27/2018   Lab Results  Component Value Date   WBC 7.8 08/30/2018   HGB 15.6 08/30/2018   HCT 44.7 08/30/2018   MCV 86 08/30/2018   PLT 302 08/30/2018   Lab Results  Component Value Date   NA 142 08/30/2018   K 3.8 08/30/2018   CO2 24 08/30/2018   GLUCOSE 106 (H) 08/30/2018   BUN 18 08/30/2018   CREATININE 1.51 (H) 08/30/2018   BILITOT 0.8 08/30/2018   ALKPHOS 72 08/30/2018   AST 24 08/30/2018   ALT 23 08/30/2018   PROT 6.8 08/30/2018   ALBUMIN 4.5 08/30/2018   CALCIUM 9.3 08/30/2018   Lab Results  Component Value Date   CHOL 103 08/30/2018   Lab Results  Component Value Date   HDL 42 08/30/2018   Lab Results  Component Value Date   LDLCALC 46 08/30/2018   Lab Results  Component Value Date   TRIG 75 08/30/2018   Lab Results  Component Value Date   CHOLHDL 2.5 08/30/2018   Lab Results  Component Value Date   HGBA1C 5.7% 02/04/2014

## 2019-07-05 ENCOUNTER — Encounter: Payer: Self-pay | Admitting: Family Medicine

## 2019-07-05 LAB — LIPID PANEL
Chol/HDL Ratio: 2.6 ratio (ref 0.0–5.0)
Cholesterol, Total: 105 mg/dL (ref 100–199)
HDL: 40 mg/dL (ref >39)
LDL Chol Calc (NIH): 46 mg/dL (ref 0–99)
Triglycerides: 102 mg/dL (ref 0–149)
VLDL Cholesterol Cal: 19 mg/dL (ref 5–40)

## 2019-07-05 LAB — CMP14+EGFR
ALT: 36 IU/L (ref 0–44)
AST: 29 IU/L (ref 0–40)
Albumin/Globulin Ratio: 2.1 (ref 1.2–2.2)
Albumin: 4.7 g/dL (ref 3.7–4.7)
Alkaline Phosphatase: 68 IU/L (ref 39–117)
BUN/Creatinine Ratio: 14 (ref 10–24)
BUN: 22 mg/dL (ref 8–27)
Bilirubin Total: 0.7 mg/dL (ref 0.0–1.2)
CO2: 26 mmol/L (ref 20–29)
Calcium: 9.4 mg/dL (ref 8.6–10.2)
Chloride: 102 mmol/L (ref 96–106)
Creatinine, Ser: 1.53 mg/dL — ABNORMAL HIGH (ref 0.76–1.27)
GFR calc Af Amer: 50 mL/min/{1.73_m2} — ABNORMAL LOW (ref >59)
GFR calc non Af Amer: 44 mL/min/{1.73_m2} — ABNORMAL LOW (ref >59)
Globulin, Total: 2.2 g/dL (ref 1.5–4.5)
Glucose: 121 mg/dL — ABNORMAL HIGH (ref 65–99)
Potassium: 4.4 mmol/L (ref 3.5–5.2)
Sodium: 141 mmol/L (ref 134–144)
Total Protein: 6.9 g/dL (ref 6.0–8.5)

## 2019-07-05 LAB — CBC WITH DIFFERENTIAL/PLATELET
Basophils Absolute: 0.1 10*3/uL (ref 0.0–0.2)
Basos: 1 %
EOS (ABSOLUTE): 0.4 10*3/uL (ref 0.0–0.4)
Eos: 5 %
Hematocrit: 44.7 % (ref 37.5–51.0)
Hemoglobin: 15.1 g/dL (ref 13.0–17.7)
Immature Grans (Abs): 0 10*3/uL (ref 0.0–0.1)
Immature Granulocytes: 0 %
Lymphocytes Absolute: 2.2 10*3/uL (ref 0.7–3.1)
Lymphs: 29 %
MCH: 30.1 pg (ref 26.6–33.0)
MCHC: 33.8 g/dL (ref 31.5–35.7)
MCV: 89 fL (ref 79–97)
Monocytes Absolute: 0.8 10*3/uL (ref 0.1–0.9)
Monocytes: 11 %
Neutrophils Absolute: 4 10*3/uL (ref 1.4–7.0)
Neutrophils: 54 %
Platelets: 273 10*3/uL (ref 150–450)
RBC: 5.01 x10E6/uL (ref 4.14–5.80)
RDW: 13 % (ref 11.6–15.4)
WBC: 7.5 10*3/uL (ref 3.4–10.8)

## 2019-07-11 ENCOUNTER — Telehealth: Payer: Self-pay | Admitting: Family Medicine

## 2019-07-11 NOTE — Telephone Encounter (Signed)
Patient aware.

## 2019-07-11 NOTE — Telephone Encounter (Signed)
Since we adjusted his thyroid medication I wanted to wait until he returns to check his thyroid so we would have an accurate reading. The accurate reading later (6 weeks on new dosage) gives me information I need to adjust his dosage if needed.

## 2019-07-11 NOTE — Telephone Encounter (Signed)
Please advise on question

## 2019-09-05 ENCOUNTER — Ambulatory Visit: Payer: Medicare Other | Admitting: Family Medicine

## 2019-09-14 ENCOUNTER — Encounter: Payer: Self-pay | Admitting: Nurse Practitioner

## 2019-09-14 ENCOUNTER — Ambulatory Visit (INDEPENDENT_AMBULATORY_CARE_PROVIDER_SITE_OTHER): Payer: Medicare Other | Admitting: Nurse Practitioner

## 2019-09-14 ENCOUNTER — Other Ambulatory Visit: Payer: Self-pay

## 2019-09-14 VITALS — BP 118/74 | HR 65 | Temp 97.7°F | Ht 72.0 in | Wt 259.0 lb

## 2019-09-14 DIAGNOSIS — E039 Hypothyroidism, unspecified: Secondary | ICD-10-CM | POA: Diagnosis not present

## 2019-09-14 DIAGNOSIS — I10 Essential (primary) hypertension: Secondary | ICD-10-CM | POA: Diagnosis not present

## 2019-09-14 DIAGNOSIS — N400 Enlarged prostate without lower urinary tract symptoms: Secondary | ICD-10-CM | POA: Diagnosis not present

## 2019-09-14 DIAGNOSIS — R739 Hyperglycemia, unspecified: Secondary | ICD-10-CM | POA: Diagnosis not present

## 2019-09-14 DIAGNOSIS — N1832 Chronic kidney disease, stage 3b: Secondary | ICD-10-CM | POA: Diagnosis not present

## 2019-09-14 DIAGNOSIS — E7800 Pure hypercholesterolemia, unspecified: Secondary | ICD-10-CM | POA: Diagnosis not present

## 2019-09-14 DIAGNOSIS — E559 Vitamin D deficiency, unspecified: Secondary | ICD-10-CM | POA: Diagnosis not present

## 2019-09-14 NOTE — Patient Instructions (Addendum)
To schedule an AWV (can be via televisit)  In 1 month  6 month for routine follow up.    DASH Eating Plan DASH stands for "Dietary Approaches to Stop Hypertension." The DASH eating plan is a healthy eating plan that has been shown to reduce high blood pressure (hypertension). It may also reduce your risk for type 2 diabetes, heart disease, and stroke. The DASH eating plan may also help with weight loss. What are tips for following this plan?  General guidelines  Avoid eating more than 2,300 mg (milligrams) of salt (sodium) a day. If you have hypertension, you may need to reduce your sodium intake to 1,500 mg a day.  Limit alcohol intake to no more than 1 drink a day for nonpregnant women and 2 drinks a day for men. One drink equals 12 oz of beer, 5 oz of wine, or 1 oz of hard liquor.  Work with your health care provider to maintain a healthy body weight or to lose weight. Ask what an ideal weight is for you.  Get at least 30 minutes of exercise that causes your heart to beat faster (aerobic exercise) most days of the week. Activities may include walking, swimming, or biking.  Work with your health care provider or diet and nutrition specialist (dietitian) to adjust your eating plan to your individual calorie needs. Reading food labels   Check food labels for the amount of sodium per serving. Choose foods with less than 5 percent of the Daily Value of sodium. Generally, foods with less than 300 mg of sodium per serving fit into this eating plan.  To find whole grains, look for the word "whole" as the first word in the ingredient list. Shopping  Buy products labeled as "low-sodium" or "no salt added."  Buy fresh foods. Avoid canned foods and premade or frozen meals. Cooking  Avoid adding salt when cooking. Use salt-free seasonings or herbs instead of table salt or sea salt. Check with your health care provider or pharmacist before using salt substitutes.  Do not fry foods. Cook  foods using healthy methods such as baking, boiling, grilling, and broiling instead.  Cook with heart-healthy oils, such as olive, canola, soybean, or sunflower oil. Meal planning  Eat a balanced diet that includes: ? 5 or more servings of fruits and vegetables each day. At each meal, try to fill half of your plate with fruits and vegetables. ? Up to 6-8 servings of whole grains each day. ? Less than 6 oz of lean meat, poultry, or fish each day. A 3-oz serving of meat is about the same size as a deck of cards. One egg equals 1 oz. ? 2 servings of low-fat dairy each day. ? A serving of nuts, seeds, or beans 5 times each week. ? Heart-healthy fats. Healthy fats called Omega-3 fatty acids are found in foods such as flaxseeds and coldwater fish, like sardines, salmon, and mackerel.  Limit how much you eat of the following: ? Canned or prepackaged foods. ? Food that is high in trans fat, such as fried foods. ? Food that is high in saturated fat, such as fatty meat. ? Sweets, desserts, sugary drinks, and other foods with added sugar. ? Full-fat dairy products.  Do not salt foods before eating.  Try to eat at least 2 vegetarian meals each week.  Eat more home-cooked food and less restaurant, buffet, and fast food.  When eating at a restaurant, ask that your food be prepared with less salt or  no salt, if possible. What foods are recommended? The items listed may not be a complete list. Talk with your dietitian about what dietary choices are best for you. Grains Whole-grain or whole-wheat bread. Whole-grain or whole-wheat pasta. Brown rice. [place]. Bulgur. Whole-grain and low-sodium cereals. Pita bread. Low-fat, low-sodium crackers. Whole-wheat flour tortillas. Vegetables Fresh or frozen vegetables (raw, steamed, roasted, or grilled). Low-sodium or reduced-sodium tomato and vegetable juice. Low-sodium or reduced-sodium tomato sauce and tomato paste. Low-sodium or reduced-sodium canned  vegetables. Fruits All fresh, dried, or frozen fruit. Canned fruit in natural juice (without added sugar). Meat and other protein foods Skinless chicken or [place]. Ground chicken or [place]. Pork with fat trimmed off. Fish and seafood. Egg whites. Dried beans, peas, or lentils. Unsalted nuts, nut butters, and seeds. Unsalted canned beans. Lean cuts of beef with fat trimmed off. Low-sodium, lean deli meat. Dairy Low-fat (1%) or fat-free (skim) milk. Fat-free, low-fat, or reduced-fat cheeses. Nonfat, low-sodium ricotta or cottage cheese. Low-fat or nonfat yogurt. Low-fat, low-sodium cheese. Fats and oils Soft margarine without trans fats. Vegetable oil. Low-fat, reduced-fat, or light mayonnaise and salad dressings (reduced-sodium). Canola, safflower, olive, soybean, and sunflower oils. Avocado. Seasoning and other foods Herbs. Spices. Seasoning mixes without salt. Unsalted popcorn and pretzels. Fat-free sweets. What foods are not recommended? The items listed may not be a complete list. Talk with your dietitian about what dietary choices are best for you. Grains Baked goods made with fat, such as croissants, muffins, or some breads. Dry pasta or rice meal packs. Vegetables Creamed or fried vegetables. Vegetables in a cheese sauce. Regular canned vegetables (not low-sodium or reduced-sodium). Regular canned tomato sauce and paste (not low-sodium or reduced-sodium). Regular tomato and vegetable juice (not low-sodium or reduced-sodium). Angie Fava. Olives. Fruits Canned fruit in a light or heavy syrup. Fried fruit. Fruit in cream or butter sauce. Meat and other protein foods Fatty cuts of meat. Ribs. Fried meat. Berniece Salines. Sausage. Bologna and other processed lunch meats. Salami. Fatback. Hotdogs. Bratwurst. Salted nuts and seeds. Canned beans with added salt. Canned or smoked fish. Whole eggs or egg yolks. Chicken or [place] with skin. Dairy Whole or 2% milk, cream, and half-and-half. Whole or full-fat  cream cheese. Whole-fat or sweetened yogurt. Full-fat cheese. Nondairy creamers. Whipped toppings. Processed cheese and cheese spreads. Fats and oils Butter. Stick margarine. Lard. Shortening. Ghee. Bacon fat. Tropical oils, such as coconut, palm kernel, or palm oil. Seasoning and other foods Salted popcorn and pretzels. Onion salt, garlic salt, seasoned salt, table salt, and sea salt. Worcestershire sauce. Tartar sauce. Barbecue sauce. Teriyaki sauce. Soy sauce, including reduced-sodium. Steak sauce. Canned and packaged gravies. Fish sauce. Oyster sauce. Cocktail sauce. Horseradish that you find on the shelf. Ketchup. Mustard. Meat flavorings and tenderizers. Bouillon cubes. Hot sauce and Tabasco sauce. Premade or packaged marinades. Premade or packaged taco seasonings. Relishes. Regular salad dressings. Where to find more information:  National Heart, Lung, and [place]: https://wilson-eaton.com/  American Heart Association: www.heart.org Summary  The DASH eating plan is a healthy eating plan that has been shown to reduce high blood pressure (hypertension). It may also reduce your risk for type 2 diabetes, heart disease, and stroke.  With the DASH eating plan, you should limit salt (sodium) intake to 2,300 mg a day. If you have hypertension, you may need to reduce your sodium intake to 1,500 mg a day.  When on the DASH eating plan, aim to eat more fresh fruits and vegetables, whole grains, lean proteins, low-fat dairy, and  heart-healthy fats.  Work with your health care provider or diet and nutrition specialist (dietitian) to adjust your eating plan to your individual calorie needs. This information is not intended to replace advice given to you by your health care provider. Make sure you discuss any questions you have with your health care provider. Document Released: 09/30/2011 Document Revised: 09/23/2017 Document Reviewed: 10/04/2016 Elsevier Patient Education  2020 [employer].

## 2019-09-14 NOTE — Progress Notes (Signed)
Careteam: Patient Care Team: Patient, No Pcp Per as PCP - General (General Practice) Vida Rigger, MD as Consulting Physician (Gastroenterology) Rollene Rotunda, MD as Consulting Physician (Cardiology) Johnney Ou, MD as Referring Physician (Ophthalmology)  Advanced Directive information Does Patient Have a Medical Advance Directive?: Yes, Type of Advance Directive: Living will, Does patient want to make changes to medical advance directive?: No - Patient declined  Allergies  Allergen Reactions  . Paregoric     Chief Complaint  Patient presents with  . Establish Care    New patient establish care. Moderate fall risk   . Medication Management    Patient had pill bottles at initial apppointment   . Labs Only    Fasting for labs due. Patient would like Thyroid checked     HPI: Patient is a 78 y.o. male seen in the office today to establish care.  Previous PCP retired, also he moved.   Hypertension- on amlodipine 10 mg daily with losartan 100 mg by mouth daily with dietary modifications.   Hyperlipidemia- on lovastatin 40 mg daily due to elevated LDL. 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 He was taking several doses different times of the week. He has been on 175 mcg since September 9th. Wife feels like TSH is off and needs more thyroid.  Dry skin, falls asleep often. He takes medication every day at the same time every day.    Hyperglycemia- working on diet for this.   Vit D def- on vit d 2000 units daily, been over a year since checked   ED- saw a urologist for this about 1 year ago, plans to follow up.   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   Specialist- Dr Darlen Round for routine colonoscopy- may have 1 more.       Review of Systems:  Review of Systems   Constitutional: Negative for chills, fever and weight loss.  HENT: Negative for tinnitus.   Respiratory: Negative for cough, sputum production and shortness of breath.   Cardiovascular: Negative for chest pain, palpitations and leg swelling.  Gastrointestinal: Negative for abdominal pain, constipation, diarrhea and heartburn.  Genitourinary: Negative for dysuria, frequency and urgency.       2-3 times at night to urinate   Musculoskeletal: Negative for back pain, falls, joint pain and myalgias.  Skin: Negative.   Neurological: Negative for dizziness and headaches.  Psychiatric/Behavioral: Negative for depression and memory loss. The patient does not have insomnia.     Past Medical History:  Diagnosis Date  . BPH (benign prostatic hypertrophy)   . CKD (chronic kidney disease)   . ED (erectile dysfunction)   . Essential hypertension, benign   . Hypothyroidism   . Right inguinal hernia    Past Surgical History:  Procedure Laterality Date  . APPENDECTOMY    . COLONOSCOPY  2017   Per [facility] New Patient Packet   . 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 HERNIA REPAIR     abd  . REPAIR EXTENSOR TENDON Left 09/07/2016   Procedure: Reconstruction PULLEY sheath left middle finger Extensor retinaculum graft;  Surgeon: Cindee Salt, MD;  Location: [facility];  Service: Orthopedics;  Laterality: Left;   Social History:   reports that he quit smoking about 26 years ago. He has a 21.00 pack-year smoking history. He has never used smokeless tobacco. He reports current alcohol use. He reports that he does not use drugs.  Family History  Problem Relation Age of Onset  . Parkinson's disease Mother   . Stroke Father   . Diabetes Father   . Diabetes Brother   . Diabetes Brother   . High Cholesterol Brother   .  Neuropathy Brother     Medications: Patient's Medications  New Prescriptions   No medications on file  Previous Medications   AMLODIPINE (NORVASC) 10 MG TABLET    Take 1 tablet (10 mg total) by mouth daily.   CHOLECALCIFEROL (VITAMIN D) 25 MCG (1000 UT) TABLET    Take 1,000-2,000 Units by mouth daily.   LEVOTHYROXINE (SYNTHROID) 175 MCG TABLET    Take 1 tablet (175 mcg total) by mouth daily before breakfast.   LOSARTAN (COZAAR) 100 MG TABLET    Take 1 tablet (100 mg total) by mouth daily.   LOVASTATIN (MEVACOR) 40 MG TABLET    Take 1 tablet (40 mg total) by mouth at bedtime.   OMEGA-3 FATTY ACIDS (FISH OIL) 1000 MG CAPS    Take 1,000 mg by mouth daily.   SILDENAFIL (REVATIO) 20 MG TABLET    Take 2-5 tabs PRN prior to sexual activity  Modified Medications   No medications on file  Discontinued Medications   No medications on file    Physical Exam:  Vitals:   09/14/19 1332  BP: 118/74  Pulse: 65  Temp: 97.7 F (36.5 C)  TempSrc: Temporal  SpO2: 97%  Weight: 259 lb (117.5 kg)  Height: 6' (1.829 m)   Body mass index is 35.13 kg/m. Wt Readings from Last 3 Encounters:  09/14/19 259 lb (117.5 kg)  07/04/19 261 lb 3.2 oz (118.5 kg)  08/30/18 249 lb (112.9 kg)    Physical Exam Constitutional:      General: He is not in acute distress.    Appearance: He is well-developed. He is not diaphoretic.  HENT:     Head: Normocephalic and atraumatic.     Mouth/Throat:     Pharynx: No oropharyngeal exudate.  Eyes:     Conjunctiva/sclera: Conjunctivae normal.     Pupils: Pupils are equal, round, and reactive to light.  Neck:     Musculoskeletal: Normal range of motion and neck supple.  Cardiovascular:     Rate and Rhythm: Normal rate and regular rhythm.     Heart sounds: Normal heart sounds.  Pulmonary:     Effort: Pulmonary effort is normal.     Breath sounds: Normal breath sounds.  Abdominal:     General: Abdomen is protuberant. Bowel sounds are normal.     Palpations:  Abdomen is soft.  Musculoskeletal:        General: No tenderness.  Skin:    General: Skin is warm and dry.  Neurological:     Mental Status: He is alert and oriented to person, place, and time.     Labs reviewed: Basic Metabolic Panel: Recent Labs    07/04/19 1137  NA 141  K 4.4  CL 102  CO2 26  GLUCOSE 121*  BUN 22  CREATININE 1.53*  CALCIUM 9.4   Liver Function Tests: Recent Labs    07/04/19 1137  AST 29  ALT 36  ALKPHOS 68  BILITOT 0.7  PROT 6.9  ALBUMIN 4.7   No results for input(s): LIPASE, AMYLASE in the last 8760 hours. No results for input(s): AMMONIA in the last 8760 hours. CBC: Recent Labs    07/04/19 1137  WBC 7.5  NEUTROABS 4.0  HGB 15.1  HCT 44.7  MCV 89  PLT 273   Lipid Panel: Recent Labs    07/04/19 1137  CHOL 105  HDL 40  LDLCALC 46  TRIG 102  CHOLHDL 2.6   TSH: No results for input(s): TSH in the last 8760 hours. A1C: Lab Results  Component Value Date   HGBA1C 5.7% 02/04/2014     Assessment/Plan 1. Stage 3b chronic kidney disease -Encourage proper hydration and to avoid NSAIDS (Aleve, Advil, Motrin, Ibuprofen)  - COMPLETE METABOLIC PANEL WITH GFR  2. Hypothyroidism, unspecified type -per last note: patient is taking 2 and 3 different tablets to make up total dosage of levothyroxine each day. Five days a week he takes a 150 mcg tablets + 1.5 of the 25 mcg tablets; then two days a week he is taking 150 mcg tablet + 25 mcg tablet. Levothyroxine was changed to 175 mcg tablets QD since he has never had it and the total weekly dosage decreased by 62.5 mcg.  - TSH to be done today  3. Benign prostatic hyperplasia, unspecified whether lower urinary tract symptoms present -stable, followed by urologist, reports recently symptoms are better, not on medication.  4. Essential hypertension - blood pressure at goal on losartan and norvasc 10 mg daily   5. Pure hypercholesterolemia -LDL at goal, continues on lovastatin 40 mg daily  - COMPLETE METABOLIC PANEL WITH GFR  6. Hyperglycemia -has been working on diet and increasing physical activity - Hemoglobin A1c  7. Vitamin D deficiency -continues on vit d supplement - Vitamin D, 25-hydroxy  Next appt: 1 month for AWV (televisit) and 6 months for routine follow up.  Carlos American. [place], [facility] [phone]

## 2019-09-15 LAB — HEMOGLOBIN A1C
Hgb A1c MFr Bld: 6.3 % of total Hgb — ABNORMAL HIGH (ref <5.7)
Mean Plasma Glucose: 134 (calc)
eAG (mmol/L): 7.4 (calc)

## 2019-09-15 LAB — COMPLETE METABOLIC PANEL WITH GFR
AG Ratio: 1.7 (calc) (ref 1.0–2.5)
ALT: 34 U/L (ref 9–46)
AST: 33 U/L (ref 10–35)
Albumin: 4.5 g/dL (ref 3.6–5.1)
Alkaline phosphatase (APISO): 64 U/L (ref 35–144)
BUN/Creatinine Ratio: 14 (calc) (ref 6–22)
BUN: 21 mg/dL (ref 7–25)
CO2: 26 mmol/L (ref 20–32)
Calcium: 9.3 mg/dL (ref 8.6–10.3)
Chloride: 105 mmol/L (ref 98–110)
Creat: 1.53 mg/dL — ABNORMAL HIGH (ref 0.70–1.18)
GFR, Est African American: 50 mL/min/{1.73_m2} — ABNORMAL LOW (ref >=60)
GFR, Est Non African American: 44 mL/min/{1.73_m2} — ABNORMAL LOW (ref >=60)
Globulin: 2.6 g/dL (calc) (ref 1.9–3.7)
Glucose, Bld: 104 mg/dL — ABNORMAL HIGH (ref 65–99)
Potassium: 4 mmol/L (ref 3.5–5.3)
Sodium: 142 mmol/L (ref 135–146)
Total Bilirubin: 1 mg/dL (ref 0.2–1.2)
Total Protein: 7.1 g/dL (ref 6.1–8.1)

## 2019-09-15 LAB — VITAMIN D 25 HYDROXY (VIT D DEFICIENCY, FRACTURES): Vit D, 25-Hydroxy: 29 ng/mL — ABNORMAL LOW (ref 30–100)

## 2019-09-15 LAB — TSH: TSH: 2.51 mIU/L (ref 0.40–4.50)

## 2019-09-19 ENCOUNTER — Other Ambulatory Visit: Payer: Self-pay

## 2019-09-19 MED ORDER — ERGOCALCIFEROL 1.25 MG (50000 UT) PO CAPS: 50000.0000 [IU] | ORAL_CAPSULE | ORAL | 0 refills | Status: DC

## 2019-10-02 ENCOUNTER — Encounter: Payer: Self-pay | Admitting: Nurse Practitioner

## 2019-10-02 ENCOUNTER — Other Ambulatory Visit: Payer: Self-pay

## 2019-10-02 ENCOUNTER — Ambulatory Visit (INDEPENDENT_AMBULATORY_CARE_PROVIDER_SITE_OTHER): Payer: Medicare Other | Admitting: Nurse Practitioner

## 2019-10-02 VITALS — Ht 72.0 in | Wt 259.0 lb

## 2019-10-02 DIAGNOSIS — Z0000 Encounter for general adult medical examination without abnormal findings: Secondary | ICD-10-CM

## 2019-10-02 NOTE — Progress Notes (Signed)
Subjective:   [name] is a 78 y.o. male who presents for Medicare Annual/Subsequent preventive examination.  Review of Systems:   Cardiac Risk Factors include: advanced age (>68men, >59 women);male gender;obesity (BMI >30kg/m2)     Objective:    Vitals: Ht 6' (1.829 m)   Wt 259 lb (117.5 kg)   BMI 35.13 kg/m   Body mass index is 35.13 kg/m.  Advanced Directives 10/02/2019 09/14/2019 08/31/2016 06/30/2016  Does Patient Have a Medical Advance Directive? No Yes Yes No  Type of Advance Directive - Living will Living will -  Does patient want to make changes to medical advance directive? Yes (MAU/Ambulatory/Procedural Areas - Information given) No - Patient declined - -    Tobacco Social History   Tobacco Use  Smoking Status Former Smoker  . Packs/day: 0.70  . Years: 30.00  . Pack years: 21.00  . Quit date: 10/25/1992  . Years since quitting: 26.9  Smokeless Tobacco Never Used     Counseling given: Not Answered   Clinical Intake:  Pre-visit preparation completed: Yes  Pain : No/denies pain     BMI - recorded: 35.13 Nutritional Status: BMI > 30  Obese Diabetes: No  How often do you need to have someone help you when you read instructions, pamphlets, or other written materials from your doctor or pharmacy?: 1 - Never What is the last grade level you completed in school?: college  Interpreter Needed?: No     Past Medical History:  Diagnosis Date  . BPH (benign prostatic hypertrophy)   . CKD (chronic kidney disease)   . ED (erectile dysfunction)   . Essential hypertension, benign   . Hypothyroidism   . Right inguinal hernia    Past Surgical History:  Procedure Laterality Date  . APPENDECTOMY    . COLONOSCOPY  2017   Per [facility] New Patient Packet   . 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 HERNIA REPAIR     abd  . REPAIR EXTENSOR TENDON Left 09/07/2016   Procedure: Reconstruction PULLEY sheath left middle finger Extensor retinaculum graft;  Surgeon: Cindee Salt, MD;  Location: [facility];  Service: Orthopedics;  Laterality: Left;   Family History  Problem Relation Age of Onset  . Parkinson's disease Mother   . Stroke Father   . Diabetes Father   . Heart disease Father   . Diabetes Brother   . Diabetes Brother   . High Cholesterol Brother   . Neuropathy Brother    Social History   Socioeconomic History  . Marital status: Married    Spouse name: Not on file  . Number of children: 3  . Years of education: Not on file  . Highest education level: Not on file  Occupational History  . Occupation: Air traffic controller  . Financial resource strain: Not on file  . Food insecurity    Worry: Not on file    Inability: Not on file  . Transportation needs    Medical: Not on file    Non-medical: Not on file  Tobacco Use  . Smoking status: Former Smoker    Packs/day: 0.70    Years: 30.00    Pack years: 21.00    Quit date: 10/25/1992    Years since quitting: 26.9  . Smokeless tobacco: Never Used  Substance and Sexual Activity  .  Alcohol use: Yes    Comment: Rare   . Drug use: No  . Sexual activity: Not on file  Lifestyle  . Physical activity    Days per week: Not on file    Minutes per session: Not on file  . Stress: Not on file  Relationships  . Social Herbalist on phone: Not on file    Gets together: Not on file    Attends religious service: Not on file    Active member of club or organization: Not on file    Attends meetings of clubs or organizations: Not on file    Relationship status: Not on file  Other Topics Concern  . Not on file  Social History Narrative   Lives with wife.        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 No   Do you have difficulty preparing food or eating? No   Do you have difficulty managing your medications? No   Do you have difficulty managing your finances? No   Do you have difficulty affording your medications? No    Outpatient Encounter Medications as of 10/02/2019  Medication Sig  . amLODipine (NORVASC) 10 MG tablet Take 1 tablet (10 mg total) by mouth daily.  . cholecalciferol (VITAMIN D) 25 MCG (1000 UT) tablet Take 1,000-2,000 Units by mouth daily.  . ergocalciferol (VITAMIN D2) 1.25 MG (50000 UT) capsule Take 1 capsule (50,000 Units total) by mouth once a week. Take this for 12 weeks then go back to vitamin d 2000 tab daily  . levothyroxine (SYNTHROID) 175 MCG tablet Take 1 tablet (175 mcg total) by mouth daily before breakfast.  . losartan (COZAAR) 100 MG tablet Take 1 tablet (100 mg total) by mouth daily.  Marland Kitchen lovastatin (MEVACOR) 40 MG tablet Take 1 tablet (40 mg total) by mouth at bedtime.  . Omega-3 Fatty Acids (FISH OIL) 1000 MG CAPS Take 1,000 mg by mouth daily.  . sildenafil (REVATIO) 20 MG tablet Take 2-5 tabs PRN prior to sexual activity   No facility-administered encounter medications on file as of 10/02/2019.     Activities of Daily Living In your present state of health, do you have any difficulty performing the following activities: 10/02/2019  Hearing? N  Vision? N  Difficulty concentrating or making decisions? N  Walking or climbing stairs? N  Dressing or bathing? N  Doing errands, shopping? N  Preparing Food and eating ? N  Using the Toilet? N  In the past six  months, have you accidently leaked urine? N  Do you have problems with loss of bowel control? N  Managing your Medications? N  Managing your Finances? N  Housekeeping or managing your Housekeeping? N  Some recent data might be hidden  Patient Care Team: Sharon SellerEubanks, Jahlon Baines K, NP as PCP - General (Geriatric Medicine) Vida RiggerMagod, Marc, MD as Consulting Physician (Gastroenterology) Rollene RotundaHochrein, Honorio, MD as Consulting Physician (Cardiology) Johnney OuBillingsley, Andrew T, MD as Referring Physician (Ophthalmology)   Assessment:   This is a routine wellness examination for [name].  Exercise Activities and Dietary recommendations Current Exercise Habits: The patient does not participate in regular exercise at present  Goals    . Weight (lb) < 220 lb (99.8 kg)     Through diet and increase in exercise.        Fall Risk Fall Risk  10/02/2019 09/14/2019 07/04/2019 08/30/2018 02/27/2018  Falls in the past year? 1 1 0 1 Yes  Number falls in past yr: 1 1 - 1 2 or more  Comment Fell out of bed - - - -  Injury with Fall? 1 0 - 1 Yes  Comment Head injury - wife is an Charity fundraiserN - used  butterfly strips - - - -  Follow up - - - - -   Is the patient's home free of loose throw rugs in walkways, pet beds, electrical cords, etc?   yes      Grab bars in the bathroom? no      Handrails on the stairs?   yes      Adequate lighting?   yes  Timed Get Up and Go Performed: na  Depression Screen PHQ 2/9 Scores 09/14/2019 07/04/2019 08/30/2018 02/27/2018  PHQ - 2 Score 0 0 0 0    Cognitive Function     6CIT Screen 10/02/2019  What Year? 0 points  What month? 0 points  What time? 0 points  Count back from 20 0 points  Months in reverse 0 points  Repeat phrase 0 points  Total Score 0    Immunization History  Administered Date(s) Administered  . Fluad Quad(high Dose 65+) 07/04/2019  . Influenza Whole 10/02/2012  . Influenza, High Dose Seasonal PF 08/16/2016, 08/12/2017, 08/30/2018  . Influenza,inj,Quad PF,6+ Mos  08/06/2013, 08/30/2014, 08/15/2015  . Pneumococcal Conjugate-13 08/06/2013  . Pneumococcal Polysaccharide-23 11/25/2008  . Tdap 07/26/2011  . Zoster 01/24/2008    Qualifies for Shingles Vaccine? yes  Screening Tests Health Maintenance  Topic Date Due  . TETANUS/TDAP  07/25/2021  . INFLUENZA VACCINE  Completed  . PNA vac Low Risk Adult  Completed   Cancer Screenings: Lung: Low Dose CT Chest recommended if Age 19-80 years, 30 pack-year currently smoking OR have quit w/in 15years. Patient does not qualify. Colorectal: due for 1 more.   Additional Screenings: Hepatitis C Screening: na      Plan:     I have personally reviewed and noted the following in the patient's chart:   . Medical and social history . Use of alcohol, tobacco or illicit drugs  . Current medications and supplements . Functional ability and status . Nutritional status . Physical activity . Advanced directives . List of other physicians . Hospitalizations, surgeries, and ER visits in previous 12 months . Vitals . Screenings to include cognitive, depression, and falls . Referrals and appointments  In addition, I have reviewed and discussed with patient certain preventive protocols, quality metrics, and best practice recommendations. A written personalized care plan for preventive services as well as general preventive health recommendations were provided to patient.     Sharon SellerJessica K Avanthika Dehnert, NP  10/02/2019

## 2019-10-02 NOTE — Patient Instructions (Signed)
[name] , Thank you for taking time to come for your Medicare Wellness Visit. I appreciate your ongoing commitment to your health goals. Please review the following plan we discussed and let me know if I can assist you in the future.   Screening recommendations/referrals: Colonoscopy - plan to get 1 more Recommended yearly ophthalmology/optometry visit for glaucoma screening and checkup Recommended yearly dental visit for hygiene and checkup  Vaccinations: Influenza vaccine up to date Pneumococcal vaccine up to date Tdap vaccine up to date Shingles vaccine -DUE- recommended to get at the pharmacy next year  Advanced directives:  to complete advanced directives and bring to next office appt.   Conditions/risks identified: BMI indicating obesity, weight loss recommended by exercise and dietary changes.   Next appointment: 1 year.   Preventive Care 45 Years and Older, Male Preventive care refers to lifestyle choices and visits with your health care provider that can promote health and wellness. What does preventive care include?  A yearly physical exam. This is also called an annual well check.  Dental exams once or twice a year.  Routine eye exams. Ask your health care provider how often you should have your eyes checked.  Personal lifestyle choices, including:  Daily care of your teeth and gums.  Regular physical activity.  Eating a healthy diet.  Avoiding tobacco and drug use.  Limiting alcohol use.  Practicing safe sex.  Taking low doses of aspirin every day.  Taking vitamin and mineral supplements as recommended by your health care provider. What happens during an annual well check? The services and screenings done by your health care provider during your annual well check will depend on your age, overall health, lifestyle risk factors, and family history of disease. Counseling  Your health care provider may ask you questions about your:  Alcohol use.   Tobacco use.  Drug use.  Emotional well-being.  Home and relationship well-being.  Sexual activity.  Eating habits.  History of falls.  Memory and ability to understand (cognition).  Work and work Statistician. Screening  You may have the following tests or measurements:  Height, weight, and BMI.  Blood pressure.  Lipid and cholesterol levels. These may be checked every 5 years, or more frequently if you are over 79 years old.  Skin check.  Lung cancer screening. You may have this screening every year starting at age 57 if you have a 30-pack-year history of smoking and currently smoke or have quit within the past 15 years.  Fecal occult blood test (FOBT) of the stool. You may have this test every year starting at age 61.  Flexible sigmoidoscopy or colonoscopy. You may have a sigmoidoscopy every 5 years or a colonoscopy every 10 years starting at age 63.  Prostate cancer screening. Recommendations will vary depending on your family history and other risks.  Hepatitis C blood test.  Hepatitis B blood test.  Sexually transmitted disease (STD) testing.  Diabetes screening. This is done by checking your blood sugar (glucose) after you have not eaten for a while (fasting). You may have this done every 1-3 years.  Abdominal aortic aneurysm (AAA) screening. You may need this if you are a current or former smoker.  Osteoporosis. You may be screened starting at age 59 if you are at high risk. Talk with your health care provider about your test results, treatment options, and if necessary, the need for more tests. Vaccines  Your health care provider may recommend certain vaccines, such as:  Influenza vaccine.  This is recommended every year.  Tetanus, diphtheria, and acellular pertussis (Tdap, Td) vaccine. You may need a Td booster every 10 years.  Zoster vaccine. You may need this after age 68.  Pneumococcal 13-valent conjugate (PCV13) vaccine. One dose is recommended  after age 76.  Pneumococcal polysaccharide (PPSV23) vaccine. One dose is recommended after age 20. Talk to your health care provider about which screenings and vaccines you need and how often you need them. This information is not intended to replace advice given to you by your health care provider. Make sure you discuss any questions you have with your health care provider. Document Released: 11/07/2015 Document Revised: 06/30/2016 Document Reviewed: 08/12/2015 Elsevier Interactive Patient Education  2017 [employer] Prevention in the Home Falls can cause injuries. They can happen to people of all ages. There are many things you can do to make your home safe and to help prevent falls. What can I do on the outside of my home?  Regularly fix the edges of walkways and driveways and fix any cracks.  Remove anything that might make you trip as you walk through a door, such as a raised step or threshold.  Trim any bushes or trees on the path to your home.  Use bright outdoor lighting.  Clear any walking paths of anything that might make someone trip, such as rocks or tools.  Regularly check to see if handrails are loose or broken. Make sure that both sides of any steps have handrails.  Any raised decks and porches should have guardrails on the edges.  Have any leaves, snow, or ice cleared regularly.  Use sand or salt on walking paths during winter.  Clean up any spills in your garage right away. This includes oil or grease spills. What can I do in the bathroom?  Use night lights.  Install grab bars by the toilet and in the tub and shower. Do not use towel bars as grab bars.  Use non-skid mats or decals in the tub or shower.  If you need to sit down in the shower, use a plastic, non-slip stool.  Keep the floor dry. Clean up any water that spills on the floor as soon as it happens.  Remove soap buildup in the tub or shower regularly.  Attach bath mats securely with  double-sided non-slip rug tape.  Do not have throw rugs and other things on the floor that can make you trip. What can I do in the bedroom?  Use night lights.  Make sure that you have a light by your bed that is easy to reach.  Do not use any sheets or blankets that are too big for your bed. They should not hang down onto the floor.  Have a firm chair that has side arms. You can use this for support while you get dressed.  Do not have throw rugs and other things on the floor that can make you trip. What can I do in the kitchen?  Clean up any spills right away.  Avoid walking on wet floors.  Keep items that you use a lot in easy-to-reach places.  If you need to reach something above you, use a strong step stool that has a grab bar.  Keep electrical cords out of the way.  Do not use floor polish or wax that makes floors slippery. If you must use wax, use non-skid floor wax.  Do not have throw rugs and other things on the floor that can make  you trip. What can I do with my stairs?  Do not leave any items on the stairs.  Make sure that there are handrails on both sides of the stairs and use them. Fix handrails that are broken or loose. Make sure that handrails are as long as the stairways.  Check any carpeting to make sure that it is firmly attached to the stairs. Fix any carpet that is loose or worn.  Avoid having throw rugs at the top or bottom of the stairs. If you do have throw rugs, attach them to the floor with carpet tape.  Make sure that you have a light switch at the top of the stairs and the bottom of the stairs. If you do not have them, ask someone to add them for you. What else can I do to help prevent falls?  Wear shoes that:  Do not have high heels.  Have rubber bottoms.  Are comfortable and fit you well.  Are closed at the toe. Do not wear sandals.  If you use a stepladder:  Make sure that it is fully opened. Do not climb a closed stepladder.  Make  sure that both sides of the stepladder are locked into place.  Ask someone to hold it for you, if possible.  Clearly mark and make sure that you can see:  Any grab bars or handrails.  First and last steps.  Where the edge of each step is.  Use tools that help you move around (mobility aids) if they are needed. These include:  Canes.  Walkers.  Scooters.  Crutches.  Turn on the lights when you go into a dark area. Replace any light bulbs as soon as they burn out.  Set up your furniture so you have a clear path. Avoid moving your furniture around.  If any of your floors are uneven, fix them.  If there are any pets around you, be aware of where they are.  Review your medicines with your doctor. Some medicines can make you feel dizzy. This can increase your chance of falling. Ask your doctor what other things that you can do to help prevent falls. This information is not intended to replace advice given to you by your health care provider. Make sure you discuss any questions you have with your health care provider. Document Released: 08/07/2009 Document Revised: 03/18/2016 Document Reviewed: 11/15/2014 Elsevier Interactive Patient Education  2017 [employer].

## 2019-10-02 NOTE — Progress Notes (Signed)
This service is provided via telemedicine  No vital signs collected/recorded due to the encounter was a telemedicine visit.   Location of patient (ex: home, work):  Home  Patient consents to a telephone visit:  Yes  Location of the provider (ex: office, home):  [facility]  Name of any referring provider:  Sherrie Mustache, NP  Names of all persons participating in the telemedicine service and their role in the encounter:  Bonney Leitz, [place]; Sherrie Mustache, NP; patient  Time spent on call:  6.40  minutes - CMA time only

## 2019-12-04 ENCOUNTER — Telehealth: Payer: Self-pay

## 2019-12-04 MED ORDER — LEVOTHYROXINE SODIUM 25 MCG PO TABS: 25.0000 ug | ORAL_TABLET | Freq: Every day | ORAL | 0 refills | Status: DC

## 2019-12-04 NOTE — Telephone Encounter (Signed)
Medication list will be updated to reflect exactly what patient is taking  I will add 150 mcg tablet, take with 25 mcg to total 175 mcg.  I will add 25 mcg tablet, take with 150 mcg tablet to total 175 mcg   RX for 25 mcg sent to [facility] as requested by patient. As previously stated patient already has a 90 day supply of the 150 mcg tablets.  Mychart message sent to patient as requested with update on actions taken place in this encounter

## 2019-12-04 NOTE — Telephone Encounter (Addendum)
I called patient to share [name], NP reply and the patient states his message was not taken correctly.  Patient is not trying to change the dose of his medication.  Patient currently has a left over 90 day supply of 150 mcg tablets, from a previous rx.  Patient is asking for 90 day supply of 25 mcg tablets to take along with his 150 mcg tablets to total his current dose of 175 mcg.   Goal- patient is trying to avoid waisting old rx of 150 mcg.   Patient asked that I send message back to [name], NP and see if she is in agreement with that and if not he will continue the 1 pill daily of the 175 mcg. Either way patient is due for rx to be sent to [facility]  Patient asked that I send him a mychart message with Shanda Bumps reply vs calling.  Shanda Bumps please review and advise

## 2019-12-04 NOTE — Telephone Encounter (Signed)
His current TSH is at goal. To  continue current dose of levothyroxine at this time.

## 2019-12-04 NOTE — Telephone Encounter (Signed)
Yes can we just phone this in so it does not change the medication dose on his medication list.

## 2019-12-04 NOTE — Telephone Encounter (Signed)
[name] has Levothyroxine  0.175 MCG 90 tablets he wants to use up , but would like you to change his pres cription to .025 tablets. His last refill was 07/04/19 please advise. Call back number is [phone].

## 2019-12-11 DIAGNOSIS — H524 Presbyopia: Secondary | ICD-10-CM | POA: Diagnosis not present

## 2019-12-11 DIAGNOSIS — H04123 Dry eye syndrome of bilateral lacrimal glands: Secondary | ICD-10-CM | POA: Diagnosis not present

## 2019-12-11 DIAGNOSIS — H26493 Other secondary cataract, bilateral: Secondary | ICD-10-CM | POA: Diagnosis not present

## 2020-03-05 DIAGNOSIS — Z0120 Encounter for dental examination and cleaning without abnormal findings: Secondary | ICD-10-CM | POA: Diagnosis not present

## 2020-03-14 ENCOUNTER — Other Ambulatory Visit: Payer: Self-pay

## 2020-03-14 ENCOUNTER — Ambulatory Visit (INDEPENDENT_AMBULATORY_CARE_PROVIDER_SITE_OTHER): Payer: Medicare Other | Admitting: Nurse Practitioner

## 2020-03-14 ENCOUNTER — Encounter: Payer: Self-pay | Admitting: Nurse Practitioner

## 2020-03-14 VITALS — BP 110/80 | HR 75 | Temp 97.8°F | Ht 72.0 in | Wt 254.0 lb

## 2020-03-14 DIAGNOSIS — I10 Essential (primary) hypertension: Secondary | ICD-10-CM

## 2020-03-14 DIAGNOSIS — R739 Hyperglycemia, unspecified: Secondary | ICD-10-CM | POA: Diagnosis not present

## 2020-03-14 DIAGNOSIS — N529 Male erectile dysfunction, unspecified: Secondary | ICD-10-CM

## 2020-03-14 DIAGNOSIS — N1832 Chronic kidney disease, stage 3b: Secondary | ICD-10-CM

## 2020-03-14 DIAGNOSIS — N400 Enlarged prostate without lower urinary tract symptoms: Secondary | ICD-10-CM | POA: Diagnosis not present

## 2020-03-14 DIAGNOSIS — E039 Hypothyroidism, unspecified: Secondary | ICD-10-CM

## 2020-03-14 DIAGNOSIS — E7800 Pure hypercholesterolemia, unspecified: Secondary | ICD-10-CM

## 2020-03-14 DIAGNOSIS — E559 Vitamin D deficiency, unspecified: Secondary | ICD-10-CM

## 2020-03-14 MED ORDER — LEVOTHYROXINE SODIUM 175 MCG PO TABS: 175.0000 ug | ORAL_TABLET | Freq: Every day | ORAL | 3 refills | Status: DC

## 2020-03-14 NOTE — Patient Instructions (Signed)
Increase activity- recommend 30 mins 5 days a week Start walking first thing in the morning and it will help blood sugar throughout the day

## 2020-03-14 NOTE — Progress Notes (Signed)
Careteam: Patient Care Team: [name], NP as PCP - General (Geriatric Medicine) Vida Rigger, MD as Consulting Physician (Gastroenterology) Rollene Rotunda, MD as Consulting Physician (Cardiology) Johnney Ou, MD as Referring Physician (Ophthalmology)  PLACE OF SERVICE:  [facility]  Advanced Directive information    Allergies  Allergen Reactions  . Paregoric     Chief Complaint  Patient presents with  . Medical Management of Chronic Issues    6 Month follow up     HPI: Patient is a 79 y.o. male for routine follow up.  Reports he is having vivid dreams and has jumped out of bed and then fallen. He cut his head, wife fixed head. He has always had vivid dreams.  Reports he is slowing down and unable to maintain an erection. This is not high on his concerns. He is not motivated enough.  Pt feels like he is doing well. He is very active. Able to do things he like.   htn- continues on losartan and norvasc   Hyperlipidemia- continues on lovastatin, LDL 46 in September  Hypothyroid- TSH at goal on last lab 2.5; taking levothyroxine 175 mg daily.   Hyperglycemia- continues to work on diet. A1c up to 6.3 6 months ago.   BPH- followed by urologist. No changes.   Vit d def- on supplement, vit d low 6 months ago.   Obesity- has lost 5 lbs since last visit, has a calorie counter.  More active in the yard. Playing golf.   Needs to follow up with cardiologist and gastroenterologist.    Review of Systems:  Review of Systems  Constitutional: Negative for chills, fever and weight loss.  HENT: Negative for tinnitus.   Respiratory: Negative for cough, sputum production and shortness of breath.   Cardiovascular: Negative for chest pain, palpitations and leg swelling.  Gastrointestinal: Negative for abdominal pain, constipation, diarrhea and heartburn.  Genitourinary: Negative for dysuria, frequency and urgency.  Musculoskeletal: Negative for back pain,  falls, joint pain and myalgias.  Skin: Negative.   Neurological: Negative for dizziness and headaches.  Psychiatric/Behavioral: Negative for depression and memory loss. The patient does not have insomnia.     Past Medical History:  Diagnosis Date  . BPH (benign prostatic hypertrophy)   . CKD (chronic kidney disease)   . ED (erectile dysfunction)   . Essential hypertension, benign   . Hypothyroidism   . Right inguinal hernia    Past Surgical History:  Procedure Laterality Date  . APPENDECTOMY    . COLONOSCOPY  2017   Per [facility] New Patient Packet   . 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 HERNIA REPAIR     abd  . REPAIR EXTENSOR TENDON Left 09/07/2016   Procedure: Reconstruction PULLEY sheath left middle finger Extensor retinaculum graft;  Surgeon: Cindee Salt, MD;  Location: [facility];  Service: Orthopedics;  Laterality: Left;   Social History:   reports that he quit smoking about 27 years ago. He has a 21.00 pack-year smoking history. He has never used smokeless tobacco. He reports current alcohol use. He reports that he does not use drugs.  Family History  Problem Relation Age of Onset  . Parkinson's disease Mother   . Stroke Father   . Diabetes Father   . Heart disease Father   . Diabetes Brother   .  Diabetes Brother   . High Cholesterol Brother   . Neuropathy Brother     Medications: Patient's Medications  New Prescriptions   No medications on file  Previous Medications   AMLODIPINE (NORVASC) 10 MG TABLET    Take 1 tablet (10 mg total) by mouth daily.   CHOLECALCIFEROL (VITAMIN D) 25 MCG (1000 UT) TABLET    Take 1,000-2,000 Units by mouth daily.   LEVOTHYROXINE (SYNTHROID) 150 MCG TABLET    Take 150 mcg by mouth daily before breakfast. take with 25 mcg to total 175 mcg.    LEVOTHYROXINE (SYNTHROID) 25 MCG TABLET    Take 1 tablet (25 mcg total) by mouth daily before breakfast. take with 150 mcg tablet to total 175 mcg.   LOSARTAN (COZAAR) 100 MG TABLET    Take 1 tablet (100 mg total) by mouth daily.   LOVASTATIN (MEVACOR) 40 MG TABLET    Take 1 tablet (40 mg total) by mouth at bedtime.   OMEGA-3 FATTY ACIDS (FISH OIL) 1000 MG CAPS    Take 1,000 mg by mouth daily.   SILDENAFIL (REVATIO) 20 MG TABLET    Take 2-5 tabs PRN prior to sexual activity  Modified Medications   No medications on file  Discontinued Medications   ERGOCALCIFEROL (VITAMIN D2) 1.25 MG (50000 UT) CAPSULE    Take 1 capsule (50,000 Units total) by mouth once a week. Take this for 12 weeks then go back to vitamin d 2000 tab daily    Physical Exam:  Vitals:   03/14/20 1314  BP: 110/80  Pulse: 75  Temp: 97.8 F (36.6 C)  TempSrc: Temporal  SpO2: 95%  Weight: 254 lb (115.2 kg)  Height: 6' (1.829 m)   Body mass index is 34.45 kg/m. Wt Readings from Last 3 Encounters:  03/14/20 254 lb (115.2 kg)  10/02/19 259 lb (117.5 kg)  09/14/19 259 lb (117.5 kg)    Physical Exam Constitutional:      General: He is not in acute distress.    Appearance: He is well-developed. He is not diaphoretic.  HENT:     Head: Normocephalic and atraumatic.     Mouth/Throat:     Pharynx: No oropharyngeal exudate.  Eyes:     Conjunctiva/sclera: Conjunctivae normal.     Pupils: Pupils are equal, round, and reactive to light.  Cardiovascular:     Rate and Rhythm: Normal rate and regular rhythm.     Heart sounds: Normal heart sounds.  Pulmonary:     Effort: Pulmonary effort is normal.     Breath sounds: Normal breath sounds.  Abdominal:     General: Bowel sounds are normal.     Palpations: Abdomen is soft.  Musculoskeletal:        General: No tenderness.     Cervical back: Normal range of motion and neck supple.  Skin:    General: Skin is warm and dry.  Neurological:     Mental Status: He is alert and  oriented to person, place, and time.     Labs reviewed: Basic Metabolic Panel: Recent Labs    07/04/19 1137 09/14/19 1427  NA 141 142  K 4.4 4.0  CL 102 105  CO2 26 26  GLUCOSE 121* 104*  BUN 22 21  CREATININE 1.53* 1.53*  CALCIUM 9.4 9.3  TSH  --  2.51   Liver Function Tests: Recent Labs    07/04/19 1137 09/14/19 1427  AST 29 33  ALT 36 34  ALKPHOS 68  --  BILITOT 0.7 1.0  PROT 6.9 7.1  ALBUMIN 4.7  --    No results for input(s): LIPASE, AMYLASE in the last 8760 hours. No results for input(s): AMMONIA in the last 8760 hours. CBC: Recent Labs    07/04/19 1137  WBC 7.5  NEUTROABS 4.0  HGB 15.1  HCT 44.7  MCV 89  PLT 273   Lipid Panel: Recent Labs    07/04/19 1137  CHOL 105  HDL 40  LDLCALC 46  TRIG 102  CHOLHDL 2.6   TSH: Recent Labs    09/14/19 1427  TSH 2.51   A1C: Lab Results  Component Value Date   HGBA1C 6.3 (H) 09/14/2019     Assessment/Plan 1. Stage 3b chronic kidney disease -will follow up lab -Encourage proper hydration and to avoid NSAIDS (Aleve, Advil, Motrin, Ibuprofen)   2. Hypothyroidism, unspecified type - TSH - levothyroxine (SYNTHROID) 175 MCG tablet; Take 1 tablet (175 mcg total) by mouth daily before breakfast.  Dispense: 90 tablet; Refill: 3  3. Benign prostatic hyperplasia, unspecified whether lower urinary tract symptoms present Stable, not requiring medication at this time.   4. Essential hypertension Controlled on current regimen, continue diet modifications with norvasc 10 mg and losartan 100 mg daily  - COMPLETE METABOLIC PANEL WITH GFR - CBC with Differential/Platelet  5. Pure hypercholesterolemia Continues on lovastatin with low fat diet.  - Lipid Panel - COMPLETE METABOLIC PANEL WITH GFR  6. Hyperglycemia - discussed low carb diet. Limiting sweets and increasing physical activity  - Hemoglobin A1c  7. Vitamin D deficiency -completed vit d 50,000 units weekly and now on daily supplement.  -  Vitamin D, 25-hydroxy  8. Morbid obesity (HCC) Continues to work on weight loss through diet modifications, encouraged adding routine exercise to regimen.  9. Erectile dysfunction, unspecified erectile dysfunction type Ongoing, he is followed by urology and will follow upw ith   Next appt: 6 months. Janene Harvey. Biagio Borg  [facility] [phone]

## 2020-03-15 LAB — CBC WITH DIFFERENTIAL/PLATELET
Absolute Monocytes: 866 cells/uL (ref 200–950)
Basophils Absolute: 91 cells/uL (ref 0–200)
Basophils Relative: 1.2 %
Eosinophils Absolute: 342 cells/uL (ref 15–500)
Eosinophils Relative: 4.5 %
HCT: 46.9 % (ref 38.5–50.0)
Hemoglobin: 16.1 g/dL (ref 13.2–17.1)
Lymphs Abs: 2090 cells/uL (ref 850–3900)
MCH: 31.1 pg (ref 27.0–33.0)
MCHC: 34.3 g/dL (ref 32.0–36.0)
MCV: 90.7 fL (ref 80.0–100.0)
MPV: 9.4 fL (ref 7.5–12.5)
Monocytes Relative: 11.4 %
Neutro Abs: 4210 cells/uL (ref 1500–7800)
Neutrophils Relative %: 55.4 %
Platelets: 266 10*3/uL (ref 140–400)
RBC: 5.17 10*6/uL (ref 4.20–5.80)
RDW: 13 % (ref 11.0–15.0)
Total Lymphocyte: 27.5 %
WBC: 7.6 10*3/uL (ref 3.8–10.8)

## 2020-03-15 LAB — COMPLETE METABOLIC PANEL WITH GFR
AG Ratio: 2 (calc) (ref 1.0–2.5)
ALT: 33 U/L (ref 9–46)
AST: 28 U/L (ref 10–35)
Albumin: 4.4 g/dL (ref 3.6–5.1)
Alkaline phosphatase (APISO): 66 U/L (ref 35–144)
BUN/Creatinine Ratio: 16 (calc) (ref 6–22)
BUN: 23 mg/dL (ref 7–25)
CO2: 25 mmol/L (ref 20–32)
Calcium: 9.2 mg/dL (ref 8.6–10.3)
Chloride: 105 mmol/L (ref 98–110)
Creat: 1.43 mg/dL — ABNORMAL HIGH (ref 0.70–1.18)
GFR, Est African American: 55 mL/min/{1.73_m2} — ABNORMAL LOW (ref >=60)
GFR, Est Non African American: 47 mL/min/{1.73_m2} — ABNORMAL LOW (ref >=60)
Globulin: 2.2 g/dL (calc) (ref 1.9–3.7)
Glucose, Bld: 111 mg/dL — ABNORMAL HIGH (ref 65–99)
Potassium: 4 mmol/L (ref 3.5–5.3)
Sodium: 140 mmol/L (ref 135–146)
Total Bilirubin: 1 mg/dL (ref 0.2–1.2)
Total Protein: 6.6 g/dL (ref 6.1–8.1)

## 2020-03-15 LAB — LIPID PANEL
Cholesterol: 108 mg/dL (ref <200)
HDL: 46 mg/dL (ref >=40)
LDL Cholesterol (Calc): 44 mg/dL (calc)
Non-HDL Cholesterol (Calc): 62 mg/dL (calc) (ref <130)
Total CHOL/HDL Ratio: 2.3 (calc) (ref <5.0)
Triglycerides: 92 mg/dL (ref <150)

## 2020-03-15 LAB — HEMOGLOBIN A1C
Hgb A1c MFr Bld: 6.1 % of total Hgb — ABNORMAL HIGH (ref <5.7)
Mean Plasma Glucose: 128 (calc)
eAG (mmol/L): 7.1 (calc)

## 2020-03-15 LAB — TSH: TSH: 2.7 mIU/L (ref 0.40–4.50)

## 2020-03-15 LAB — VITAMIN D 25 HYDROXY (VIT D DEFICIENCY, FRACTURES): Vit D, 25-Hydroxy: 40 ng/mL (ref 30–100)

## 2020-03-17 ENCOUNTER — Telehealth: Payer: Self-pay

## 2020-03-17 MED ORDER — LEVOTHYROXINE SODIUM 175 MCG PO TABS: 175.0000 ug | ORAL_TABLET | Freq: Every day | ORAL | 3 refills | Status: DC

## 2020-03-17 NOTE — Telephone Encounter (Signed)
Left detailed message on voicemail informing patient rx sent to mail order and canceled at local pharmacy. Patient instructed to call if he has questions or concerns

## 2020-03-17 NOTE — Telephone Encounter (Signed)
Called [facility] and left message requesting them to void rx for levothyroxine, we are submitting rx to mail-order pharmacy per patient request (refer to labs dated 03/14/2020).  RX sent to [facility] as requested by patient.

## 2020-03-17 NOTE — Addendum Note (Signed)
Addended by: Maurice Small on: 03/17/2020 04:09 PM   Modules accepted: Orders

## 2020-07-16 ENCOUNTER — Encounter (INDEPENDENT_AMBULATORY_CARE_PROVIDER_SITE_OTHER): Payer: Self-pay

## 2020-07-21 DIAGNOSIS — Z86010 Personal history of colonic polyps: Secondary | ICD-10-CM | POA: Diagnosis not present

## 2020-07-21 DIAGNOSIS — K5901 Slow transit constipation: Secondary | ICD-10-CM | POA: Diagnosis not present

## 2020-08-08 ENCOUNTER — Other Ambulatory Visit: Payer: Self-pay | Admitting: Family Medicine

## 2020-08-08 DIAGNOSIS — I10 Essential (primary) hypertension: Secondary | ICD-10-CM

## 2020-08-20 ENCOUNTER — Other Ambulatory Visit: Payer: Self-pay | Admitting: Family Medicine

## 2020-08-20 ENCOUNTER — Other Ambulatory Visit: Payer: Self-pay | Admitting: *Deleted

## 2020-08-20 DIAGNOSIS — I10 Essential (primary) hypertension: Secondary | ICD-10-CM

## 2020-08-20 DIAGNOSIS — E7800 Pure hypercholesterolemia, unspecified: Secondary | ICD-10-CM

## 2020-08-20 MED ORDER — LOVASTATIN 40 MG PO TABS: 40.0000 mg | ORAL_TABLET | Freq: Every day | ORAL | 1 refills | Status: DC

## 2020-08-20 MED ORDER — AMLODIPINE BESYLATE 10 MG PO TABS: 10.0000 mg | ORAL_TABLET | Freq: Every day | ORAL | 1 refills | Status: DC

## 2020-08-20 MED ORDER — LOSARTAN POTASSIUM 100 MG PO TABS: 100.0000 mg | ORAL_TABLET | Freq: Every day | ORAL | 1 refills | Status: DC

## 2020-08-20 NOTE — Telephone Encounter (Signed)
Patient requested refills.

## 2020-09-05 DIAGNOSIS — Z20822 Contact with and (suspected) exposure to covid-19: Secondary | ICD-10-CM | POA: Diagnosis not present

## 2020-09-10 DIAGNOSIS — D125 Benign neoplasm of sigmoid colon: Secondary | ICD-10-CM | POA: Diagnosis not present

## 2020-09-10 DIAGNOSIS — Z86010 Personal history of colonic polyps: Secondary | ICD-10-CM | POA: Diagnosis not present

## 2020-09-10 DIAGNOSIS — D123 Benign neoplasm of transverse colon: Secondary | ICD-10-CM | POA: Diagnosis not present

## 2020-09-10 HISTORY — PX: COLONOSCOPY: SHX174

## 2020-09-10 LAB — HM COLONOSCOPY

## 2020-09-12 DIAGNOSIS — D125 Benign neoplasm of sigmoid colon: Secondary | ICD-10-CM | POA: Diagnosis not present

## 2020-09-12 DIAGNOSIS — D123 Benign neoplasm of transverse colon: Secondary | ICD-10-CM | POA: Diagnosis not present

## 2020-09-15 ENCOUNTER — Ambulatory Visit: Payer: Medicare Other | Admitting: Nurse Practitioner

## 2020-09-15 DIAGNOSIS — Z0120 Encounter for dental examination and cleaning without abnormal findings: Secondary | ICD-10-CM | POA: Diagnosis not present

## 2020-09-24 ENCOUNTER — Ambulatory Visit (INDEPENDENT_AMBULATORY_CARE_PROVIDER_SITE_OTHER): Payer: Medicare Other | Admitting: Nurse Practitioner

## 2020-09-24 ENCOUNTER — Ambulatory Visit
Admission: RE | Admit: 2020-09-24 | Discharge: 2020-09-24 | Disposition: A | Discharge status: Home / Self Care | Payer: Medicare Other | Source: Ambulatory Visit | Attending: Nurse Practitioner | Admitting: Nurse Practitioner

## 2020-09-24 ENCOUNTER — Other Ambulatory Visit: Payer: Self-pay

## 2020-09-24 ENCOUNTER — Encounter: Payer: Self-pay | Admitting: Nurse Practitioner

## 2020-09-24 VITALS — BP 122/80 | HR 64 | Temp 97.1°F | Ht 72.0 in | Wt 256.0 lb

## 2020-09-24 DIAGNOSIS — M170 Bilateral primary osteoarthritis of knee: Secondary | ICD-10-CM

## 2020-09-24 DIAGNOSIS — Z23 Encounter for immunization: Secondary | ICD-10-CM

## 2020-09-24 DIAGNOSIS — Z1159 Encounter for screening for other viral diseases: Secondary | ICD-10-CM | POA: Diagnosis not present

## 2020-09-24 DIAGNOSIS — E7800 Pure hypercholesterolemia, unspecified: Secondary | ICD-10-CM | POA: Diagnosis not present

## 2020-09-24 DIAGNOSIS — R739 Hyperglycemia, unspecified: Secondary | ICD-10-CM | POA: Diagnosis not present

## 2020-09-24 DIAGNOSIS — I10 Essential (primary) hypertension: Secondary | ICD-10-CM | POA: Diagnosis not present

## 2020-09-24 DIAGNOSIS — N1832 Chronic kidney disease, stage 3b: Secondary | ICD-10-CM

## 2020-09-24 DIAGNOSIS — R059 Cough, unspecified: Secondary | ICD-10-CM

## 2020-09-24 DIAGNOSIS — N400 Enlarged prostate without lower urinary tract symptoms: Secondary | ICD-10-CM

## 2020-09-24 DIAGNOSIS — E559 Vitamin D deficiency, unspecified: Secondary | ICD-10-CM

## 2020-09-24 DIAGNOSIS — E039 Hypothyroidism, unspecified: Secondary | ICD-10-CM

## 2020-09-24 NOTE — Patient Instructions (Addendum)
Start zyrtec 10 (can get OTC generic brand) daily to see if this helps with morning cough   Weight loss is important for knee pain  Tylenol 325 mg 2 tablets every 6 hours as needed Ice knee as needed Physical therapy

## 2020-09-24 NOTE — Progress Notes (Signed)
Careteam: Patient Care Team: [name], NP as PCP - General (Geriatric Medicine) Vida Rigger, MD as Consulting Physician (Gastroenterology) Rollene Rotunda, MD as Consulting Physician (Cardiology) Johnney Ou, MD as Referring Physician (Ophthalmology)  PLACE OF SERVICE:  [facility]  Advanced Directive information Does Patient Have a Medical Advance Directive?: No, Does patient want to make changes to medical advance directive?: Yes (MAU/Ambulatory/Procedural Areas - Information given)  Allergies  Allergen Reactions  . Paregoric     Chief Complaint  Patient presents with  . Medical Management of Chronic Issues    6 month follow-up      HPI: Patient is a 79 y.o. male for routine follow up.   A lot going on in family but reports he is doing well. Had a good thanksgiving.   Has a hacking cough a few times in the morning- coughs up some sputum. No shortness of breath.  Reports allergies that bother him occasional when he is doing yard work Cough went away this summer but now back.   Walks 30 mins a day, continues to play golf  Having a lot more pain with knees.   Review of Systems:  Review of Systems  Constitutional: Negative for chills, fever and weight loss.  HENT: Negative for tinnitus.   Respiratory: Positive for cough. Negative for sputum production and shortness of breath.   Cardiovascular: Negative for chest pain, palpitations and leg swelling.  Gastrointestinal: Positive for constipation. Negative for abdominal pain, diarrhea and heartburn.  Genitourinary: Positive for frequency. Negative for dysuria and urgency.  Musculoskeletal: Positive for joint pain. Negative for back pain, falls and myalgias.  Skin: Negative.   Neurological: Negative for dizziness and headaches.  Psychiatric/Behavioral: Negative for depression and memory loss. The patient does not have insomnia.     Past Medical History:  Diagnosis Date  . BPH (benign prostatic  hypertrophy)   . CKD (chronic kidney disease)   . ED (erectile dysfunction)   . Essential hypertension, benign   . Hypothyroidism   . Right inguinal hernia    Past Surgical History:  Procedure Laterality Date  . APPENDECTOMY    . COLONOSCOPY  2017   Per [facility] New Patient Packet   . COLONOSCOPY  09/10/2020   Dr.Magod, awaiting biopsy results. Polyps present   . 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 HERNIA REPAIR     abd  . REPAIR EXTENSOR TENDON Left 09/07/2016   Procedure: Reconstruction PULLEY sheath left middle finger Extensor retinaculum graft;  Surgeon: Cindee Salt, MD;  Location: [facility];  Service: Orthopedics;  Laterality: Left;   Social History:   reports that he quit smoking about 27 years ago. He has a 21.00 pack-year smoking history. He has never used smokeless tobacco. He reports current alcohol use. He reports that he does not use drugs.  Family History  Problem Relation Age of Onset  . Parkinson's disease Mother   . Stroke Father   . Diabetes Father   . Heart disease Father   . Diabetes Brother   . Diabetes Brother   . High Cholesterol Brother   . Neuropathy Brother     Medications: Patient's Medications  New Prescriptions   No medications on file  Previous Medications   AMLODIPINE (NORVASC) 10 MG TABLET    Take 1 tablet (10 mg total) by  mouth daily.   CHOLECALCIFEROL (VITAMIN D) 25 MCG (1000 UT) TABLET    Take 2,000 Units by mouth daily.    LEVOTHYROXINE (SYNTHROID) 175 MCG TABLET    Take 1 tablet (175 mcg total) by mouth daily before breakfast.   LOSARTAN (COZAAR) 100 MG TABLET    Take 1 tablet (100 mg total) by mouth daily.   LOVASTATIN (MEVACOR) 40 MG TABLET    Take 1 tablet (40 mg total) by mouth at bedtime.   OMEGA-3 FATTY ACIDS (FISH OIL) 1000 MG CAPS    Take 1,000  mg by mouth daily.   SILDENAFIL (REVATIO) 20 MG TABLET    Take 2-5 tabs PRN prior to sexual activity  Modified Medications   No medications on file  Discontinued Medications   No medications on file    Physical Exam:  Vitals:   09/24/20 1043  BP: 122/80  Pulse: 64  Temp: (!) 97.1 F (36.2 C)  TempSrc: Temporal  SpO2: 97%  Weight: 256 lb (116.1 kg)  Height: 6' (1.829 m)   Body mass index is 34.72 kg/m. Wt Readings from Last 3 Encounters:  09/24/20 256 lb (116.1 kg)  03/14/20 254 lb (115.2 kg)  10/02/19 259 lb (117.5 kg)    Physical Exam Constitutional:      General: He is not in acute distress.    Appearance: He is well-developed. He is not diaphoretic.  HENT:     Head: Normocephalic and atraumatic.     Mouth/Throat:     Pharynx: No oropharyngeal exudate.  Eyes:     Conjunctiva/sclera: Conjunctivae normal.     Pupils: Pupils are equal, round, and reactive to light.  Cardiovascular:     Rate and Rhythm: Normal rate and regular rhythm.     Heart sounds: Normal heart sounds.  Pulmonary:     Effort: Pulmonary effort is normal.     Breath sounds: Normal breath sounds.  Abdominal:     General: Bowel sounds are normal.     Palpations: Abdomen is soft.  Musculoskeletal:        General: No tenderness.     Cervical back: Normal range of motion and neck supple.     Right lower leg: No edema.     Left lower leg: No edema.  Skin:    General: Skin is warm and dry.  Neurological:     Mental Status: He is alert and oriented to person, place, and time.  Psychiatric:        Mood and Affect: Mood normal.        Behavior: Behavior normal.    Labs reviewed: Basic Metabolic Panel: Recent Labs    03/14/20 1342  NA 140  K 4.0  CL 105  CO2 25  GLUCOSE 111*  BUN 23  CREATININE 1.43*  CALCIUM 9.2  TSH 2.70   Liver Function Tests: Recent Labs    03/14/20 1342  AST 28  ALT 33  BILITOT 1.0  PROT 6.6   No results for input(s): LIPASE, AMYLASE in the last 8760  hours. No results for input(s): AMMONIA in the last 8760 hours. CBC: Recent Labs    03/14/20 1342  WBC 7.6  NEUTROABS 4,210  HGB 16.1  HCT 46.9  MCV 90.7  PLT 266   Lipid Panel: Recent Labs    03/14/20 1342  CHOL 108  HDL 46  LDLCALC 44  TRIG 92  CHOLHDL 2.3   TSH: Recent Labs    03/14/20 1342  TSH 2.70  A1C: Lab Results  Component Value Date   HGBA1C 6.1 (H) 03/14/2020     Assessment/Plan 1. Stage 3b chronic kidney disease (HCC) -Encourage proper hydration and to avoid NSAIDS (Aleve, Advil, Motrin, Ibuprofen)  - COMPLETE METABOLIC PANEL WITH GFR  2. Essential hypertension -blood pressure at goal on norvasc and losartan - COMPLETE METABOLIC PANEL WITH GFR - CBC with Differential/Platelet  3. Hyperglycemia -encouraged dietary modifications, reports he does eat a lot of sweets and has a hard time avoiding.  - Hemoglobin A1c  4. Need for hepatitis C screening test - Hepatitis C antibody  5. Need for influenza vaccination - Flu Vaccine QUAD High Dose(Fluad)  6. Cough With hx of smoking, will get chest xray at this time - DG Chest 2 View; Future  7. Primary osteoarthritis of both knees -without pain with walking but bending knees can be painful. Encouraged strength training to help support knees and weight loss Can use tylenol PRN as well.  - Ambulatory referral to Physical Therapy  8. Pure hypercholesterolemia -continues on lovastatin with dietary modifications - Lipid Panel  9. Hypothyroidism, unspecified type Continues on synthroid 175 mcg, TSH at goal on last lab.    10. Benign prostatic hyperplasia, unspecified whether lower urinary tract symptoms present Occasionally with frequency, this has improved currently.  11. Vitamin D deficiency Continue to vit d supplement dialy.    Next appt: 6 months, labs at appt. Janene Harvey. Biagio Borg  [facility] [phone]

## 2020-09-27 LAB — COMPLETE METABOLIC PANEL WITH GFR
AG Ratio: 1.6 (calc) (ref 1.0–2.5)
ALT: 23 U/L (ref 9–46)
AST: 22 U/L (ref 10–35)
Albumin: 4.6 g/dL (ref 3.6–5.1)
Alkaline phosphatase (APISO): 69 U/L (ref 35–144)
BUN/Creatinine Ratio: 16 (calc) (ref 6–22)
BUN: 27 mg/dL — ABNORMAL HIGH (ref 7–25)
CO2: 20 mmol/L (ref 20–32)
Calcium: 9.7 mg/dL (ref 8.6–10.3)
Chloride: 106 mmol/L (ref 98–110)
Creat: 1.7 mg/dL — ABNORMAL HIGH (ref 0.70–1.18)
GFR, Est African American: 44 mL/min/{1.73_m2} — ABNORMAL LOW (ref >=60)
GFR, Est Non African American: 38 mL/min/{1.73_m2} — ABNORMAL LOW (ref >=60)
Globulin: 2.9 g/dL (calc) (ref 1.9–3.7)
Glucose, Bld: 111 mg/dL — ABNORMAL HIGH (ref 65–99)
Potassium: 4.2 mmol/L (ref 3.5–5.3)
Sodium: 143 mmol/L (ref 135–146)
Total Bilirubin: 0.7 mg/dL (ref 0.2–1.2)
Total Protein: 7.5 g/dL (ref 6.1–8.1)

## 2020-09-27 LAB — CBC WITH DIFFERENTIAL/PLATELET
Absolute Monocytes: 912 {cells}/uL (ref 200–950)
Basophils Absolute: 125 {cells}/uL (ref 0–200)
Basophils Relative: 1.3 %
Eosinophils Absolute: 365 {cells}/uL (ref 15–500)
Eosinophils Relative: 3.8 %
HCT: 45.9 % (ref 38.5–50.0)
Hemoglobin: 16 g/dL (ref 13.2–17.1)
Lymphs Abs: 2246 {cells}/uL (ref 850–3900)
MCH: 31.2 pg (ref 27.0–33.0)
MCHC: 34.9 g/dL (ref 32.0–36.0)
MCV: 89.5 fL (ref 80.0–100.0)
MPV: 9.2 fL (ref 7.5–12.5)
Monocytes Relative: 9.5 %
Neutro Abs: 5952 {cells}/uL (ref 1500–7800)
Neutrophils Relative %: 62 %
Platelets: 283 Thousand/uL (ref 140–400)
RBC: 5.13 Million/uL (ref 4.20–5.80)
RDW: 12.5 % (ref 11.0–15.0)
Total Lymphocyte: 23.4 %
WBC: 9.6 Thousand/uL (ref 3.8–10.8)

## 2020-09-27 LAB — TEST AUTHORIZATION

## 2020-09-27 LAB — HEMOGLOBIN A1C
Hgb A1c MFr Bld: 6.3 %{Hb} — ABNORMAL HIGH
Mean Plasma Glucose: 134 (calc)
eAG (mmol/L): 7.4 (calc)

## 2020-09-27 LAB — LIPID PANEL
Cholesterol: 114 mg/dL (ref <200)
HDL: 44 mg/dL (ref >=40)
LDL Cholesterol (Calc): 52 mg/dL (calc)
Non-HDL Cholesterol (Calc): 70 mg/dL (calc) (ref <130)
Total CHOL/HDL Ratio: 2.6 (calc) (ref <5.0)
Triglycerides: 99 mg/dL (ref <150)

## 2020-09-27 LAB — HEPATITIS C ANTIBODY
Hepatitis C Ab: NONREACTIVE
SIGNAL TO CUT-OFF: 0.01 (ref <1.00)

## 2020-10-03 ENCOUNTER — Ambulatory Visit (INDEPENDENT_AMBULATORY_CARE_PROVIDER_SITE_OTHER): Payer: Medicare Other | Admitting: Nurse Practitioner

## 2020-10-03 ENCOUNTER — Encounter: Payer: Self-pay | Admitting: Nurse Practitioner

## 2020-10-03 ENCOUNTER — Other Ambulatory Visit: Payer: Self-pay

## 2020-10-03 DIAGNOSIS — Z0000 Encounter for general adult medical examination without abnormal findings: Secondary | ICD-10-CM

## 2020-10-03 NOTE — Progress Notes (Signed)
   This service is provided via telemedicine  No vital signs collected/recorded due to the encounter was a telemedicine visit.   Location of patient (ex: home, work):  Home  Patient consents to a telephone visit: Yes, see telephone encounter dated 10/03/2020  Location of the provider (ex: office, home):  [facility], Office   Name of any referring provider:  N/A  Names of all persons participating in the telemedicine service and their role in the encounter:  S.Chrae B/CMA, Abbey Chatters, NP, and Patient   Time spent on call:  7 min with medical assistant

## 2020-10-03 NOTE — Progress Notes (Signed)
Subjective:   [name] is a 79 y.o. male who presents for Medicare Annual/Subsequent preventive examination.  Review of Systems     Cardiac Risk Factors include: advanced age (>5855men, 47>65 women);obesity (BMI >30kg/m2);hypertension;male gender     Objective:    There were no vitals filed for this visit. There is no height or weight on file to calculate BMI.  Advanced Directives 10/03/2020 09/24/2020 03/14/2020 10/02/2019 09/14/2019 08/31/2016 06/30/2016  Does Patient Have a Medical Advance Directive? No No No No Yes Yes No  Type of Advance Directive - - - - Living will Living will -  Does patient want to make changes to medical advance directive? Yes (MAU/Ambulatory/Procedural Areas - Information given) Yes (MAU/Ambulatory/Procedural Areas - Information given) - Yes (MAU/Ambulatory/Procedural Areas - Information given) No - Patient declined - -  Would patient like information on creating a medical advance directive? - - No - Patient declined - - - -    Current Medications (verified) Outpatient Encounter Medications as of 10/03/2020  Medication Sig  . amLODipine (NORVASC) 10 MG tablet Take 1 tablet (10 mg total) by mouth daily.  . cholecalciferol (VITAMIN D) 25 MCG (1000 UT) tablet Take 2,000 Units by mouth daily.   Marland Kitchen. levothyroxine (SYNTHROID) 175 MCG tablet Take 1 tablet (175 mcg total) by mouth daily before breakfast.  . losartan (COZAAR) 100 MG tablet Take 1 tablet (100 mg total) by mouth daily.  Marland Kitchen. lovastatin (MEVACOR) 40 MG tablet Take 1 tablet (40 mg total) by mouth at bedtime.  . Omega-3 Fatty Acids (FISH OIL) 1000 MG CAPS Take 1,000 mg by mouth daily.  . sildenafil (REVATIO) 20 MG tablet Take 2-5 tabs PRN prior to sexual activity   No facility-administered encounter medications on file as of 10/03/2020.    Allergies (verified) Paregoric   History: Past Medical History:  Diagnosis Date  . BPH (benign prostatic hypertrophy)   . CKD (chronic kidney disease)   . ED  (erectile dysfunction)   . Essential hypertension, benign   . Hypothyroidism   . Right inguinal hernia    Past Surgical History:  Procedure Laterality Date  . APPENDECTOMY    . COLONOSCOPY  2017   Per [facility] New Patient Packet   . COLONOSCOPY  09/10/2020   Dr.Magod, awaiting biopsy results. Polyps present   . 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 HERNIA REPAIR     abd  . REPAIR EXTENSOR TENDON Left 09/07/2016   Procedure: Reconstruction PULLEY sheath left middle finger Extensor retinaculum graft;  Surgeon: Cindee SaltGary Kuzma, MD;  Location: [facility];  Service: Orthopedics;  Laterality: Left;   Family History  Problem Relation Age of Onset  . Parkinson's disease Mother   . Stroke Father   . Diabetes Father   . Heart disease Father   . Diabetes Brother   . Diabetes Brother   . High Cholesterol Brother   . Neuropathy Brother    Social History   Socioeconomic History  . Marital status: Married    Spouse name: Not on file  . Number of children: 3  . Years of education: Not on file  . Highest education level: Not on file  Occupational History  . Occupation: Textiles  Tobacco Use  . Smoking status: Former Smoker    Packs/day: 0.70    Years: 30.00    Pack  years: 21.00    Quit date: 10/25/1992    Years since quitting: 27.9  . Smokeless tobacco: Never Used  Vaping Use  . Vaping Use: Never used  Substance and Sexual Activity  . Alcohol use: Yes    Comment: Rare   . Drug use: No  . Sexual activity: Not on file  Other Topics Concern  . Not on file  Social History Narrative   Lives with wife.        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 No   Do you have difficulty preparing food or eating? No   Do you have difficulty managing your medications? No   Do you have difficulty managing your finances? No   Do you have difficulty affording your medications? No   Social Determinants of Corporate investment banker Strain: Not on file  Food Insecurity: Not on file  Transportation Needs: Not on file  Physical Activity: Not on file  Stress: Not on file  Social Connections: Not on file    Tobacco Counseling Counseling given: Not Answered   Clinical Intake:  Pre-visit preparation completed: Yes  Pain : No/denies pain     BMI - recorded: 34 Nutritional Status: BMI > 30  Obese Nutritional Risks: None Diabetes: No  How often do you need to have someone help you when you read instructions, pamphlets, or other written materials from your doctor or pharmacy?: 1 - Never  Diabetic?no         Activities of Daily Living In your present state of health, do you have any difficulty performing the following activities: 10/03/2020  Hearing? N  Vision? N  Difficulty concentrating or making decisions? N  Walking or climbing stairs? N  Dressing or bathing? N  Doing errands, shopping? N  Preparing Food and eating ? N  Using the Toilet? N  In the past six months, have you accidently leaked urine? N  Do you have problems with loss of bowel control? N  Managing your Medications? N  Managing your Finances? N  Housekeeping or managing your Housekeeping? N  Some recent data might be hidden    Patient Care Team: [name], NP as PCP - General (Geriatric Medicine) Vida Rigger,  MD as Consulting Physician (Gastroenterology) Rollene Rotunda, MD as Consulting Physician (Cardiology) Johnney Ou, MD as Referring Physician (Ophthalmology)  Indicate any recent Medical Services you may have received from other than [facility] providers in the past year (date may be approximate).     Assessment:   This is a routine wellness examination for Rocket.  Hearing/Vision screen  Hearing Screening   125Hz  250Hz  500Hz  1000Hz  2000Hz  3000Hz  4000Hz  6000Hz  8000Hz   Right ear:           Left ear:  Comments: No hearing issues   Vision Screening Comments: Last eye exam 11/2019   Dietary issues and exercise activities discussed: Current Exercise Habits: Home exercise routine, Type of exercise: walking, Time (Minutes): 35, Frequency (Times/Week): 5, Weekly Exercise (Minutes/Week): 175  Goals    . Weight (lb) < 220 lb (99.8 kg)     Through diet and increase in exercise.       Depression Screen PHQ 2/9 Scores 10/03/2020 09/24/2020 09/14/2019 07/04/2019 08/30/2018 02/27/2018 08/12/2017  PHQ - 2 Score 0 0 0 0 0 0 0    Fall Risk Fall Risk  10/03/2020 09/24/2020 03/14/2020 10/02/2019 09/14/2019  Falls in the past year? 0 0 1 1 1   Number falls in past yr: 0 0 1 1 1   Comment - - - Fell out of bed -  Injury with Fall? 0 0 0 1 0  Comment - - - Head injury - wife is an - used  butterfly strips -  Risk for fall due to : - - History of fall(s) - -  Follow up - - - - -    FALL RISK PREVENTION PERTAINING TO THE HOME:  Any stairs in or around the home? Yes  If so, are there any without handrails? No  Home free of loose throw rugs in walkways, pet beds, electrical cords, etc? Yes  Adequate lighting in your home to reduce risk of falls? Yes   ASSISTIVE DEVICES UTILIZED TO PREVENT FALLS:  Life alert? No  Use of a cane, walker or w/c? No  Grab bars in the bathroom? Yes  Shower chair or bench in shower? No  Elevated toilet seat or a handicapped toilet? Yes   TIMED UP AND  GO:  Was the test performed? No .  na  Cognitive Function:     6CIT Screen 10/03/2020 10/02/2019  What Year? 0 points 0 points  What month? 0 points 0 points  What time? 0 points 0 points  Count back from 20 0 points 0 points  Months in reverse 0 points 0 points  Repeat phrase 0 points 0 points  Total Score 0 0    Immunizations Immunization History  Administered Date(s) Administered  . Fluad Quad(high Dose 65+) 07/04/2019, 09/24/2020  . Influenza Whole 10/02/2012  . Influenza, High Dose Seasonal PF 08/16/2016, 08/12/2017, 08/30/2018  . Influenza,inj,Quad PF,6+ Mos 08/06/2013, 08/30/2014, 08/15/2015  . Moderna SARS-COVID-2 Vaccination 11/14/2019, 12/17/2019, 09/02/2020  . Pneumococcal Conjugate-13 08/06/2013  . Pneumococcal Polysaccharide-23 11/25/2008  . Tdap 07/26/2011  . Zoster 01/24/2008    TDAP status: Up to date  Flu Vaccine status: Up to date  Pneumococcal vaccine status: Up to date  Covid-19 vaccine status: Completed vaccines  Qualifies for Shingles Vaccine? Yes   Zostavax completed Yes   Shingrix Completed?: Yes  Screening Tests Health Maintenance  Topic Date Due  . TETANUS/TDAP  07/25/2021  . INFLUENZA VACCINE  Completed  . COVID-19 Vaccine  Completed  . Hepatitis C Screening  Completed  . PNA vac Low Risk Adult  Completed    Health Maintenance  There are no preventive care reminders to display for this patient.  Colorectal cancer screening: No longer required.   Lung Cancer Screening: (Low Dose CT Chest recommended if Age 13-80 years, 30 pack-year currently smoking OR have quit w/in 15years.) does not qualify.   Lung Cancer Screening Referral: na  Additional Screening:  Hepatitis C Screening: does qualify; Completed 09/24/2020  Vision Screening: Recommended annual ophthalmology exams for early detection of glaucoma and other  disorders of the eye. Is the patient up to date with their annual eye exam?  Yes  Who is the provider or what is the  name of the office in which the patient attends annual eye exams? Dr Karma Greaser If pt is not established with a provider, would they like to be referred to a provider to establish care? No .   Dental Screening: Recommended annual dental exams for proper oral hygiene  Community Resource Referral / Chronic Care Management: CRR required this visit?  No   CCM required this visit?  No      Plan:     I have personally reviewed and noted the following in the patient's chart:   . Medical and social history . Use of alcohol, tobacco or illicit drugs  . Current medications and supplements . Functional ability and status . Nutritional status . Physical activity . Advanced directives . List of other physicians . Hospitalizations, surgeries, and ER visits in previous 12 months . Vitals . Screenings to include cognitive, depression, and falls . Referrals and appointments  In addition, I have reviewed and discussed with patient certain preventive protocols, quality metrics, and best practice recommendations. A written personalized care plan for preventive services as well as general preventive health recommendations were provided to patient.     [name], NP   10/03/2020    Virtual Visit via Telephone Note  I connected with@ on 10/03/20 at 11:30 AM EST by telephone and verified that I am speaking with the correct person using two identifiers.  Location: Patient: home Provider: [facility]   I discussed the limitations, risks, security and privacy concerns of performing an evaluation and management service by telephone and the availability of in person appointments. I also discussed with the patient that there may be a patient responsible charge related to this service. The patient expressed understanding and agreed to proceed.   I discussed the assessment and treatment plan with the patient. The patient was provided an opportunity to ask questions and all were answered. The patient  agreed with the plan and demonstrated an understanding of the instructions.   The patient was advised to call back or seek an in-person evaluation if the symptoms worsen or if the condition fails to improve as anticipated.  I provided 20 minutes of non-face-to-face time during this encounter.  Janene Harvey. [employer] printed and mailed

## 2020-10-03 NOTE — Patient Instructions (Signed)
[name] , Thank you for taking time to come for your Medicare Wellness Visit. I appreciate your ongoing commitment to your health goals. Please review the following plan we discussed and let me know if I can assist you in the future.   Screening recommendations/referrals: Colonoscopy- up to date Recommended yearly ophthalmology/optometry visit for glaucoma screening and checkup Recommended yearly dental visit for hygiene and checkup  Vaccinations: Influenza vaccine up to date Pneumococcal vaccine up to date Tdap vaccine up to date Shingles vaccine up to date    Advanced directives: recommended to complete and bring to office to place file.   Conditions/risks identified: advanced age, morbid obesity, cardiac risk factor  Next appointment: 1 year  Preventive Care 36 Years and Older, Male Preventive care refers to lifestyle choices and visits with your health care provider that can promote health and wellness. What does preventive care include?  A yearly physical exam. This is also called an annual well check.  Dental exams once or twice a year.  Routine eye exams. Ask your health care provider how often you should have your eyes checked.  Personal lifestyle choices, including:  Daily care of your teeth and gums.  Regular physical activity.  Eating a healthy diet.  Avoiding tobacco and drug use.  Limiting alcohol use.  Practicing safe sex.  Taking low doses of aspirin every day.  Taking vitamin and mineral supplements as recommended by your health care provider. What happens during an annual well check? The services and screenings done by your health care provider during your annual well check will depend on your age, overall health, lifestyle risk factors, and family history of disease. Counseling  Your health care provider may ask you questions about your:  Alcohol use.  Tobacco use.  Drug use.  Emotional well-being.  Home and relationship  well-being.  Sexual activity.  Eating habits.  History of falls.  Memory and ability to understand (cognition).  Work and work Astronomer. Screening  You may have the following tests or measurements:  Height, weight, and BMI.  Blood pressure.  Lipid and cholesterol levels. These may be checked every 5 years, or more frequently if you are over 26 years old.  Skin check.  Lung cancer screening. You may have this screening every year starting at age 64 if you have a 30-pack-year history of smoking and currently smoke or have quit within the past 15 years.  Fecal occult blood test (FOBT) of the stool. You may have this test every year starting at age 26.  Flexible sigmoidoscopy or colonoscopy. You may have a sigmoidoscopy every 5 years or a colonoscopy every 10 years starting at age 72.  Prostate cancer screening. Recommendations will vary depending on your family history and other risks.  Hepatitis C blood test.  Hepatitis B blood test.  Sexually transmitted disease (STD) testing.  Diabetes screening. This is done by checking your blood sugar (glucose) after you have not eaten for a while (fasting). You may have this done every 1-3 years.  Abdominal aortic aneurysm (AAA) screening. You may need this if you are a current or former smoker.  Osteoporosis. You may be screened starting at age 30 if you are at high risk. Talk with your health care provider about your test results, treatment options, and if necessary, the need for more tests. Vaccines  Your health care provider may recommend certain vaccines, such as:  Influenza vaccine. This is recommended every year.  Tetanus, diphtheria, and acellular pertussis (Tdap, Td) vaccine.  You may need a Td booster every 10 years.  Zoster vaccine. You may need this after age 85.  Pneumococcal 13-valent conjugate (PCV13) vaccine. One dose is recommended after age 75.  Pneumococcal polysaccharide (PPSV23) vaccine. One dose is  recommended after age 44. Talk to your health care provider about which screenings and vaccines you need and how often you need them. This information is not intended to replace advice given to you by your health care provider. Make sure you discuss any questions you have with your health care provider. Document Released: 11/07/2015 Document Revised: 06/30/2016 Document Reviewed: 08/12/2015 Elsevier Interactive Patient Education  2017 [place] Prevention in the Home Falls can cause injuries. They can happen to people of all ages. There are many things you can do to make your home safe and to help prevent falls. What can I do on the outside of my home?  Regularly fix the edges of walkways and driveways and fix any cracks.  Remove anything that might make you trip as you walk through a door, such as a raised step or threshold.  Trim any bushes or trees on the path to your home.  Use bright outdoor lighting.  Clear any walking paths of anything that might make someone trip, such as rocks or tools.  Regularly check to see if handrails are loose or broken. Make sure that both sides of any steps have handrails.  Any raised decks and porches should have guardrails on the edges.  Have any leaves, snow, or ice cleared regularly.  Use sand or salt on walking paths during winter.  Clean up any spills in your garage right away. This includes oil or grease spills. What can I do in the bathroom?  Use night lights.  Install grab bars by the toilet and in the tub and shower. Do not use towel bars as grab bars.  Use non-skid mats or decals in the tub or shower.  If you need to sit down in the shower, use a plastic, non-slip stool.  Keep the floor dry. Clean up any water that spills on the floor as soon as it happens.  Remove soap buildup in the tub or shower regularly.  Attach bath mats securely with double-sided non-slip rug tape.  Do not have throw rugs and other things on  the floor that can make you trip. What can I do in the bedroom?  Use night lights.  Make sure that you have a light by your bed that is easy to reach.  Do not use any sheets or blankets that are too big for your bed. They should not hang down onto the floor.  Have a firm chair that has side arms. You can use this for support while you get dressed.  Do not have throw rugs and other things on the floor that can make you trip. What can I do in the kitchen?  Clean up any spills right away.  Avoid walking on wet floors.  Keep items that you use a lot in easy-to-reach places.  If you need to reach something above you, use a strong step stool that has a grab bar.  Keep electrical cords out of the way.  Do not use floor polish or wax that makes floors slippery. If you must use wax, use non-skid floor wax.  Do not have throw rugs and other things on the floor that can make you trip. What can I do with my stairs?  Do not leave any  items on the stairs.  Make sure that there are handrails on both sides of the stairs and use them. Fix handrails that are broken or loose. Make sure that handrails are as long as the stairways.  Check any carpeting to make sure that it is firmly attached to the stairs. Fix any carpet that is loose or worn.  Avoid having throw rugs at the top or bottom of the stairs. If you do have throw rugs, attach them to the floor with carpet tape.  Make sure that you have a light switch at the top of the stairs and the bottom of the stairs. If you do not have them, ask someone to add them for you. What else can I do to help prevent falls?  Wear shoes that:  Do not have high heels.  Have rubber bottoms.  Are comfortable and fit you well.  Are closed at the toe. Do not wear sandals.  If you use a stepladder:  Make sure that it is fully opened. Do not climb a closed stepladder.  Make sure that both sides of the stepladder are locked into place.  Ask someone to  hold it for you, if possible.  Clearly mark and make sure that you can see:  Any grab bars or handrails.  First and last steps.  Where the edge of each step is.  Use tools that help you move around (mobility aids) if they are needed. These include:  Canes.  Walkers.  Scooters.  Crutches.  Turn on the lights when you go into a dark area. Replace any light bulbs as soon as they burn out.  Set up your furniture so you have a clear path. Avoid moving your furniture around.  If any of your floors are uneven, fix them.  If there are any pets around you, be aware of where they are.  Review your medicines with your doctor. Some medicines can make you feel dizzy. This can increase your chance of falling. Ask your doctor what other things that you can do to help prevent falls. This information is not intended to replace advice given to you by your health care provider. Make sure you discuss any questions you have with your health care provider. Document Released: 08/07/2009 Document Revised: 03/18/2016 Document Reviewed: 11/15/2014 Elsevier Interactive Patient Education  2017 [employer].

## 2020-10-06 ENCOUNTER — Telehealth: Payer: Self-pay

## 2020-10-06 NOTE — Telephone Encounter (Signed)
Late entry  [name], grape are scheduled for a virtual visit with your provider today.    Just as we do with appointments in the office, we must obtain your consent to participate.  Your consent will be active for this visit and any virtual visit you may have with one of our providers in the next 365 days.    If you have a MyChart account, I can also send a copy of this consent to you electronically.  All virtual visits are billed to your insurance company just like a traditional visit in the office.  As this is a virtual visit, video technology does not allow for your provider to perform a traditional examination.  This may limit your provider's ability to fully assess your condition.  If your provider identifies any concerns that need to be evaluated in person or the need to arrange testing such as labs, EKG, etc, we will make arrangements to do so.    Although advances in technology are sophisticated, we cannot ensure that it will always work on either your end or our end.  If the connection with a video visit is poor, we may have to switch to a telephone visit.  With either a video or telephone visit, we are not always able to ensure that we have a secure connection.   I need to obtain your verbal consent now.   Are you willing to proceed with your visit today?   VIVEK GREALISH has provided verbal consent on 10/03/2020 for a virtual visit (video or telephone).   Edison Simon [place], [place] 10/03/2020  11:25 am

## 2020-10-14 ENCOUNTER — Encounter: Payer: Self-pay | Admitting: Nurse Practitioner

## 2020-10-31 ENCOUNTER — Telehealth: Payer: Self-pay | Admitting: *Deleted

## 2020-10-31 DIAGNOSIS — M170 Bilateral primary osteoarthritis of knee: Secondary | ICD-10-CM

## 2020-10-31 NOTE — Telephone Encounter (Signed)
Patient called and stated that he would like a referral to an Orthopaedic. Stated that he would rather go to the Orthopaedic first before doing PT for his Right Knee Pain.  Stated that it was discussed at appointment on 09/24/2020. Please Advise.    OV Note Dated 09/24/2020: 7. Primary osteoarthritis of both knees -without pain with walking but bending knees can be painful. Encouraged strength training to help support knees and weight loss Can use tylenol PRN as well.  - Ambulatory referral to Physical Therapy

## 2020-10-31 NOTE — Telephone Encounter (Signed)
Referral placed for orthopedics. 

## 2020-11-13 ENCOUNTER — Ambulatory Visit: Payer: Self-pay

## 2020-11-13 ENCOUNTER — Ambulatory Visit (INDEPENDENT_AMBULATORY_CARE_PROVIDER_SITE_OTHER): Payer: Medicare Other

## 2020-11-13 ENCOUNTER — Ambulatory Visit: Payer: Medicare Other | Admitting: Orthopaedic Surgery

## 2020-11-13 DIAGNOSIS — M1711 Unilateral primary osteoarthritis, right knee: Secondary | ICD-10-CM | POA: Diagnosis not present

## 2020-11-13 DIAGNOSIS — M1712 Unilateral primary osteoarthritis, left knee: Secondary | ICD-10-CM

## 2020-11-13 NOTE — Progress Notes (Signed)
Office Visit Note   Patient: [name]           Date of Birth: 1941-06-27           MRN: [id number] Visit Date: 11/13/2020              Requested by: [name], NP [address] [place]. [place],  [place] [address] PCP: [name], NP   Assessment & Plan: Visit Diagnoses:  1. Primary osteoarthritis of left knee   2. Primary osteoarthritis of right knee     Plan: Impression is advanced bilateral knee DJD.  Patient was offered cortisone injections but declined.  Mainly he just wants to make sure physical therapy is okay which I encouraged him to start and reassured him that PT has been demonstrated to improve arthritic knee pain although there are rare instances where PT made the knee pain worse.  We also talked about the importance of being overweight and how this impacts knee pain.  We also touched upon OTC NSAIDs and topical NSAIDs.  Knee braces can be helpful as well.  He has my card if he needs to reach out to me in the future.  Follow-Up Instructions: Return if symptoms worsen or fail to improve.   Orders:  Orders Placed This Encounter  Procedures  . XR KNEE 3 VIEW RIGHT  . XR KNEE 3 VIEW LEFT   No orders of the defined types were placed in this encounter.     Procedures: No procedures performed   Clinical Data: No additional findings.   Subjective: Chief Complaint  Patient presents with  . Right Knee - Pain  . Left Knee - Pain    [name] is a 80 year old gentleman comes in for evaluation of bilateral knee pain that has gotten worse over the last few years.  He still plays golf quite regularly.  He states that he has pain when he is crouching or when he tries to put on shoes as well as going up and down steps.  He has never received any physical therapy or cortisone injections.  He was actually referred to physical therapy by his primary care doctor but his wife wanted him to be evaluated by an orthopedic surgeon prior to physical therapy.   Review of  Systems  Constitutional: Negative.   All other systems reviewed and are negative.    Objective: Vital Signs: There were no vitals taken for this visit.  Physical Exam Vitals and nursing note reviewed.  Constitutional:      Appearance: He is well-developed and well-nourished.  HENT:     Head: Normocephalic and atraumatic.  Eyes:     Pupils: Pupils are equal, round, and reactive to light.  Pulmonary:     Effort: Pulmonary effort is normal.  Abdominal:     Palpations: Abdomen is soft.  Musculoskeletal:        General: Normal range of motion.     Cervical back: Neck supple.  Skin:    General: Skin is warm.  Neurological:     Mental Status: He is alert and oriented to person, place, and time.  Psychiatric:        Mood and Affect: Mood and affect normal.        Behavior: Behavior normal.        Thought Content: Thought content normal.        Judgment: Judgment normal.     Ortho Exam Bilateral knees reveal mild pain with range of motion.  Moderate patellofemoral crepitus with range of motion.  Collaterals and cruciates are stable.  Mild varus deformity.  No bony tenderness. Specialty Comments:  No specialty comments available.  Imaging: XR KNEE 3 VIEW LEFT  Result Date: 11/13/2020 Advanced tricompartmental degenerative joint disease  XR KNEE 3 VIEW RIGHT  Result Date: 11/13/2020 Advanced tricompartmental degenerative joint disease    PMFS History: Patient Active Problem List   Diagnosis Date Noted  . Primary osteoarthritis of right knee 11/13/2020  . Primary osteoarthritis of left knee 11/13/2020  . Morbid obesity (HCC) 03/14/2020  . Overweight 03/10/2017  . 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 Metabolic syndrome 02/04/2014  . Hyperlipidemia 01/31/2013  . Hypothyroid 01/31/2013  . BPH (benign prostatic hyperplasia) 01/31/2013  . CKD (chronic kidney disease) stage 3, GFR 30-59 ml/min (HCC) 01/31/2013  . Essential hypertension 02/03/2010    Past Medical History:  Diagnosis Date  . BPH (benign prostatic hypertrophy)   . CKD (chronic kidney disease)   . ED (erectile dysfunction)   . Essential hypertension, benign   . Hypothyroidism   . Right inguinal hernia     Family History  Problem Relation Age of Onset  . Parkinson's disease Mother   . Stroke Father   . Diabetes Father   . Heart disease Father   . Diabetes Brother   . Diabetes Brother   . High Cholesterol Brother   . Neuropathy Brother     Past Surgical History:  Procedure Laterality Date  . APPENDECTOMY    . COLONOSCOPY  2017   Per [facility] New Patient Packet   . COLONOSCOPY  09/10/2020   Dr.Magod, awaiting biopsy results. Polyps present   . 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 HERNIA REPAIR     abd  . REPAIR EXTENSOR TENDON Left 09/07/2016   Procedure: Reconstruction PULLEY sheath left middle finger Extensor retinaculum graft;  Surgeon: Cindee Salt, MD;  Location: [facility];  Service: Orthopedics;  Laterality: Left;   Social History   Occupational History  . Occupation: Textiles  Tobacco Use  . Smoking status: Former Smoker    Packs/day: 0.70    Years: 30.00    Pack years: 21.00    Quit date: 10/25/1992    Years since quitting: 28.0  . Smokeless tobacco: Never Used  Vaping Use  . Vaping Use: Never used  Substance and Sexual Activity  . Alcohol use: Yes    Comment: Rare   . Drug use: No  . Sexual activity: Not on file

## 2020-11-25 DIAGNOSIS — M25562 Pain in left knee: Secondary | ICD-10-CM | POA: Diagnosis not present

## 2020-11-25 DIAGNOSIS — R293 Abnormal posture: Secondary | ICD-10-CM | POA: Diagnosis not present

## 2020-11-25 DIAGNOSIS — M170 Bilateral primary osteoarthritis of knee: Secondary | ICD-10-CM | POA: Diagnosis not present

## 2020-11-25 DIAGNOSIS — M6281 Muscle weakness (generalized): Secondary | ICD-10-CM | POA: Diagnosis not present

## 2020-11-25 DIAGNOSIS — M25561 Pain in right knee: Secondary | ICD-10-CM | POA: Diagnosis not present

## 2020-12-03 DIAGNOSIS — R293 Abnormal posture: Secondary | ICD-10-CM | POA: Diagnosis not present

## 2020-12-03 DIAGNOSIS — M25561 Pain in right knee: Secondary | ICD-10-CM | POA: Diagnosis not present

## 2020-12-03 DIAGNOSIS — M6281 Muscle weakness (generalized): Secondary | ICD-10-CM | POA: Diagnosis not present

## 2020-12-03 DIAGNOSIS — M25562 Pain in left knee: Secondary | ICD-10-CM | POA: Diagnosis not present

## 2020-12-03 DIAGNOSIS — M170 Bilateral primary osteoarthritis of knee: Secondary | ICD-10-CM | POA: Diagnosis not present

## 2020-12-10 DIAGNOSIS — M170 Bilateral primary osteoarthritis of knee: Secondary | ICD-10-CM | POA: Diagnosis not present

## 2020-12-10 DIAGNOSIS — M6281 Muscle weakness (generalized): Secondary | ICD-10-CM | POA: Diagnosis not present

## 2020-12-10 DIAGNOSIS — R293 Abnormal posture: Secondary | ICD-10-CM | POA: Diagnosis not present

## 2020-12-10 DIAGNOSIS — M25562 Pain in left knee: Secondary | ICD-10-CM | POA: Diagnosis not present

## 2020-12-10 DIAGNOSIS — M25561 Pain in right knee: Secondary | ICD-10-CM | POA: Diagnosis not present

## 2020-12-11 DIAGNOSIS — H26493 Other secondary cataract, bilateral: Secondary | ICD-10-CM | POA: Diagnosis not present

## 2020-12-11 DIAGNOSIS — H04123 Dry eye syndrome of bilateral lacrimal glands: Secondary | ICD-10-CM | POA: Diagnosis not present

## 2020-12-11 DIAGNOSIS — H524 Presbyopia: Secondary | ICD-10-CM | POA: Diagnosis not present

## 2020-12-17 DIAGNOSIS — M170 Bilateral primary osteoarthritis of knee: Secondary | ICD-10-CM | POA: Diagnosis not present

## 2020-12-17 DIAGNOSIS — M25561 Pain in right knee: Secondary | ICD-10-CM | POA: Diagnosis not present

## 2020-12-17 DIAGNOSIS — M6281 Muscle weakness (generalized): Secondary | ICD-10-CM | POA: Diagnosis not present

## 2020-12-17 DIAGNOSIS — M25562 Pain in left knee: Secondary | ICD-10-CM | POA: Diagnosis not present

## 2020-12-17 DIAGNOSIS — R293 Abnormal posture: Secondary | ICD-10-CM | POA: Diagnosis not present

## 2021-01-12 DIAGNOSIS — Z87891 Personal history of nicotine dependence: Secondary | ICD-10-CM | POA: Diagnosis not present

## 2021-01-12 DIAGNOSIS — R1030 Lower abdominal pain, unspecified: Secondary | ICD-10-CM | POA: Diagnosis not present

## 2021-01-12 DIAGNOSIS — K8020 Calculus of gallbladder without cholecystitis without obstruction: Secondary | ICD-10-CM | POA: Diagnosis not present

## 2021-01-12 DIAGNOSIS — R109 Unspecified abdominal pain: Secondary | ICD-10-CM | POA: Diagnosis not present

## 2021-01-12 DIAGNOSIS — K5900 Constipation, unspecified: Secondary | ICD-10-CM | POA: Diagnosis not present

## 2021-02-05 ENCOUNTER — Other Ambulatory Visit: Payer: Self-pay | Admitting: Nurse Practitioner

## 2021-02-05 DIAGNOSIS — E039 Hypothyroidism, unspecified: Secondary | ICD-10-CM

## 2021-02-05 DIAGNOSIS — E7800 Pure hypercholesterolemia, unspecified: Secondary | ICD-10-CM

## 2021-03-06 ENCOUNTER — Ambulatory Visit: Payer: Medicare Other | Admitting: Nurse Practitioner

## 2021-03-10 ENCOUNTER — Other Ambulatory Visit: Payer: Self-pay | Admitting: Nurse Practitioner

## 2021-03-10 DIAGNOSIS — I10 Essential (primary) hypertension: Secondary | ICD-10-CM

## 2021-03-25 ENCOUNTER — Encounter: Payer: Self-pay | Admitting: Nurse Practitioner

## 2021-03-25 ENCOUNTER — Ambulatory Visit (INDEPENDENT_AMBULATORY_CARE_PROVIDER_SITE_OTHER): Payer: Medicare Other | Admitting: Nurse Practitioner

## 2021-03-25 ENCOUNTER — Other Ambulatory Visit: Payer: Self-pay

## 2021-03-25 VITALS — BP 130/82 | HR 65 | Temp 97.5°F | Ht 72.0 in | Wt 250.4 lb

## 2021-03-25 DIAGNOSIS — E039 Hypothyroidism, unspecified: Secondary | ICD-10-CM

## 2021-03-25 DIAGNOSIS — M170 Bilateral primary osteoarthritis of knee: Secondary | ICD-10-CM | POA: Diagnosis not present

## 2021-03-25 DIAGNOSIS — N1832 Chronic kidney disease, stage 3b: Secondary | ICD-10-CM | POA: Diagnosis not present

## 2021-03-25 DIAGNOSIS — I10 Essential (primary) hypertension: Secondary | ICD-10-CM

## 2021-03-25 DIAGNOSIS — Z6830 Body mass index (BMI) 30.0-30.9, adult: Secondary | ICD-10-CM

## 2021-03-25 DIAGNOSIS — R739 Hyperglycemia, unspecified: Secondary | ICD-10-CM

## 2021-03-25 DIAGNOSIS — E559 Vitamin D deficiency, unspecified: Secondary | ICD-10-CM

## 2021-03-25 DIAGNOSIS — E6609 Other obesity due to excess calories: Secondary | ICD-10-CM

## 2021-03-25 DIAGNOSIS — E7800 Pure hypercholesterolemia, unspecified: Secondary | ICD-10-CM

## 2021-03-25 NOTE — Progress Notes (Signed)
  Careteam: Patient Care Team: Eubanks, Jessica K, NP as PCP - General (Geriatric Medicine) Magod, Marc, MD as Consulting Physician (Gastroenterology) Hochrein, Martavion, MD as Consulting Physician (Cardiology) Billingsley, Andrew T, MD as Referring Physician (Ophthalmology)  PLACE OF SERVICE:  [facility]  Advanced Directive information Does Patient Have a Medical Advance Directive?: Yes, Does patient want to make changes to medical advance directive?: Yes (MAU/Ambulatory/Procedural Areas - Information given)  Allergies  Allergen Reactions   Paregoric     Chief Complaint  Patient presents with   Medical Management of Chronic Issues    6 month follow up. Discuss the need for zoster vaccine.     HPI: Patient is a 80 y.o. male for routine follow up   Had bowel impaction in march. He was severely constipated, gave him soap suds enema which relieved him.  He is taking stool softener daily with increase in water and taking prunes.  He has tendency to get constipation.   Continues to walk and use a bike   Had increase in knee pain- went to therapy, recommended bike which has helped.   Hypertension- controlled on norvasc, losartan.   Hypothyroid. Due to for TSH  Hyperlipidemia- fasting today, on lovastatin   Biggest concerned in becoming diabetic. Hyperglycemic on last labs and strong family history of diabetes.     Review of Systems:  Review of Systems  Constitutional:  Negative for chills, fever and weight loss.  HENT:  Negative for tinnitus.   Respiratory:  Negative for cough, sputum production and shortness of breath.   Cardiovascular:  Negative for chest pain, palpitations and leg swelling.  Gastrointestinal:  Negative for abdominal pain, constipation, diarrhea and heartburn.  Genitourinary:  Negative for dysuria, frequency and urgency.  Musculoskeletal:  Negative for back pain, falls, joint pain and myalgias.  Skin: Negative.   Neurological:  Negative for dizziness  and headaches.  Psychiatric/Behavioral:  Negative for depression and memory loss. The patient does not have insomnia.    Past Medical History:  Diagnosis Date   BPH (benign prostatic hypertrophy)    CKD (chronic kidney disease)    ED (erectile dysfunction)    Essential hypertension, benign    Hypothyroidism    Right inguinal hernia    Past Surgical History:  Procedure Laterality Date   APPENDECTOMY     COLONOSCOPY  2017   Per [facility] New Patient Packet    COLONOSCOPY  09/10/2020   Dr.Magod, awaiting biopsy results. 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 He has a 21.00 pack-year smoking history. He has never used smokeless tobacco. He reports current alcohol use. He reports that he does not use drugs.  Family History  Problem Relation Age of Onset   Parkinson's disease Mother    Stroke Father    Diabetes Father    Heart disease Father    Diabetes Brother    Diabetes Brother    High Cholesterol Brother    Neuropathy Brother     Medications:   Patient's Medications  New Prescriptions   No medications on file  Previous Medications   AMLODIPINE (NORVASC) 10 MG TABLET    TAKE 1 TABLET BY MOUTH DAILY   CHOLECALCIFEROL (VITAMIN D) 25 MCG (1000 UT) TABLET    Take 2,000 Units by mouth daily.    LEVOTHYROXINE (SYNTHROID) 175 MCG TABLET    TAKE 1 TABLET BY MOUTH DAILY BEFORE BREAKFAST   LOSARTAN (COZAAR) 100 MG TABLET    TAKE 1 TABLET BY MOUTH DAILY   LOVASTATIN (MEVACOR) 40 MG  TABLET    TAKE 1 TABLET BY MOUTH AT BEDTIME   OMEGA-3 FATTY ACIDS (FISH OIL PO)    Take 2,000 mg by mouth daily.   SILDENAFIL (REVATIO) 20 MG TABLET    Take 2-5 tabs PRN prior to sexual activity  Modified Medications   No medications on file  Discontinued Medications   No medications on file    Physical Exam:  Vitals:   03/25/21 1104  BP: 130/82  Pulse: 65  Temp: (!) 97.5 F (36.4 C)  SpO2: 96%  Weight: 250 lb 6.4 oz (113.6 kg)  Height: 6' (1.829 m)   Body mass index is 33.96 kg/m. Wt Readings from Last 3 Encounters:  03/25/21 250 lb 6.4 oz (113.6 kg)  09/24/20 256 lb (116.1 kg)  03/14/20 254 lb (115.2 kg)    Physical Exam Constitutional:      General: He is not in acute distress.    Appearance: He is well-developed. He is not diaphoretic.  HENT:     Head: Normocephalic and atraumatic.     Right Ear: External ear normal.     Left Ear: External ear normal.     Mouth/Throat:     Pharynx: No oropharyngeal exudate.  Eyes:     Conjunctiva/sclera: Conjunctivae normal.     Pupils: Pupils are equal, round, and reactive to light.  Cardiovascular:     Rate and Rhythm: Normal rate and regular rhythm.     Heart sounds: Normal heart sounds.  Pulmonary:     Effort: Pulmonary effort is normal.     Breath sounds: Normal breath sounds.  Abdominal:     General: Bowel sounds are normal.     Palpations: Abdomen is soft.  Musculoskeletal:        General: No tenderness.     Cervical back: Normal range of motion and neck supple.     Right lower leg: No edema.     Left lower leg: No edema.  Skin:    General: Skin is warm and dry.  Neurological:     Mental Status: He is alert and oriented to person, place, and time.  Psychiatric:        Mood and Affect: Mood normal.   Labs reviewed: Basic Metabolic Panel: Recent Labs    09/24/20 1126  NA 143  K 4.2  CL 106  CO2 20  GLUCOSE 111*  BUN 27*  CREATININE 1.70*  CALCIUM 9.7   Liver Function Tests: Recent Labs     09/24/20 1126  AST 22  ALT 23  BILITOT 0.7  PROT 7.5   No results for input(s): LIPASE, AMYLASE in the last 8760 hours. No results for input(s): AMMONIA in the last 8760 hours. CBC: Recent Labs    09/24/20 1126  WBC 9.6  NEUTROABS 5,952  HGB 16.0  HCT 45.9  MCV 89.5  PLT 283   Lipid Panel: Recent Labs    09/24/20 1126  CHOL 114  HDL 44  LDLCALC  52  TRIG 99  CHOLHDL 2.6   TSH: No results for input(s): TSH in the last 8760 hours. A1C: Lab Results  Component Value Date   HGBA1C 6.3 (H) 09/24/2020     Assessment/Plan 1. Primary osteoarthritis of both knees -improved with exercises. Encouraged to continue exercise at this time. Can use tylenol as needed. Weight loss would be benefitical.   2. Stage 3b chronic kidney disease ([place]) Encourage proper hydration and to avoid NSAIDS (Aleve, Advil, Motrin, Ibuprofen)   3. Hyperglycemia -encouraged dietary modifications, low sugar, low carbohydrate diet. Increase physical activity 30 mins/5 days a week - Hemoglobin A1c  4. Essential hypertension -controlled on losartan 100 mg daily - CMP with eGFR([facility]) - CBC with Differential/Platelet  5. Hypothyroidism, unspecified type -on synthroid 175 mcg daily, will follow up TSH at this time - TSH  6. Vitamin D deficiency -continues on vit d 1000 units daily - Vitamin D, 25-hydroxy  7. Pure hypercholesterolemia -dietary modifications encouraged, continues on lovastatin 40 mg daily   8. Class 1 obesity due to excess calories with serious comorbidity and body mass index (BMI) of 30.0 to 30.9 in adult -education provided on healthy weight loss through increase in physical activity and proper nutrition.     Next appt: 6 months. [name]. [place], [facility] [phone]

## 2021-03-26 LAB — CBC WITH DIFFERENTIAL/PLATELET
Absolute Monocytes: 833 cells/uL (ref 200–950)
Basophils Absolute: 77 cells/uL (ref 0–200)
Basophils Relative: 1.1 %
Eosinophils Absolute: 252 cells/uL (ref 15–500)
Eosinophils Relative: 3.6 %
HCT: 45.3 % (ref 38.5–50.0)
Hemoglobin: 15.5 g/dL (ref 13.2–17.1)
Lymphs Abs: 2254 cells/uL (ref 850–3900)
MCH: 31.4 pg (ref 27.0–33.0)
MCHC: 34.2 g/dL (ref 32.0–36.0)
MCV: 91.7 fL (ref 80.0–100.0)
MPV: 9.1 fL (ref 7.5–12.5)
Monocytes Relative: 11.9 %
Neutro Abs: 3584 cells/uL (ref 1500–7800)
Neutrophils Relative %: 51.2 %
Platelets: 257 10*3/uL (ref 140–400)
RBC: 4.94 10*6/uL (ref 4.20–5.80)
RDW: 12.7 % (ref 11.0–15.0)
Total Lymphocyte: 32.2 %
WBC: 7 10*3/uL (ref 3.8–10.8)

## 2021-03-26 LAB — COMPLETE METABOLIC PANEL WITH GFR
AG Ratio: 1.8 (calc) (ref 1.0–2.5)
ALT: 30 U/L (ref 9–46)
AST: 28 U/L (ref 10–35)
Albumin: 4.6 g/dL (ref 3.6–5.1)
Alkaline phosphatase (APISO): 66 U/L (ref 35–144)
BUN/Creatinine Ratio: 14 (calc) (ref 6–22)
BUN: 25 mg/dL (ref 7–25)
CO2: 26 mmol/L (ref 20–32)
Calcium: 9.4 mg/dL (ref 8.6–10.3)
Chloride: 104 mmol/L (ref 98–110)
Creat: 1.83 mg/dL — ABNORMAL HIGH (ref 0.70–1.18)
GFR, Est African American: 40 mL/min/{1.73_m2} — ABNORMAL LOW (ref >=60)
GFR, Est Non African American: 35 mL/min/{1.73_m2} — ABNORMAL LOW (ref >=60)
Globulin: 2.5 g/dL (calc) (ref 1.9–3.7)
Glucose, Bld: 109 mg/dL — ABNORMAL HIGH (ref 65–99)
Potassium: 4.2 mmol/L (ref 3.5–5.3)
Sodium: 140 mmol/L (ref 135–146)
Total Bilirubin: 1.2 mg/dL (ref 0.2–1.2)
Total Protein: 7.1 g/dL (ref 6.1–8.1)

## 2021-03-26 LAB — HEMOGLOBIN A1C
Hgb A1c MFr Bld: 6.4 % of total Hgb — ABNORMAL HIGH (ref <5.7)
Mean Plasma Glucose: 137 mg/dL
eAG (mmol/L): 7.6 mmol/L

## 2021-03-26 LAB — TSH: TSH: 5.02 mIU/L — ABNORMAL HIGH (ref 0.40–4.50)

## 2021-03-26 LAB — VITAMIN D 25 HYDROXY (VIT D DEFICIENCY, FRACTURES): Vit D, 25-Hydroxy: 43 ng/mL (ref 30–100)

## 2021-05-21 ENCOUNTER — Encounter: Payer: Medicare Other | Admitting: Nurse Practitioner

## 2021-08-31 ENCOUNTER — Other Ambulatory Visit: Payer: Self-pay | Admitting: Nurse Practitioner

## 2021-08-31 DIAGNOSIS — E7800 Pure hypercholesterolemia, unspecified: Secondary | ICD-10-CM

## 2021-08-31 DIAGNOSIS — I10 Essential (primary) hypertension: Secondary | ICD-10-CM

## 2021-09-25 ENCOUNTER — Ambulatory Visit: Payer: Medicare Other | Admitting: Nurse Practitioner

## 2021-10-06 ENCOUNTER — Telehealth: Payer: Self-pay

## 2021-10-06 ENCOUNTER — Other Ambulatory Visit: Payer: Self-pay

## 2021-10-06 ENCOUNTER — Ambulatory Visit (INDEPENDENT_AMBULATORY_CARE_PROVIDER_SITE_OTHER): Payer: Medicare Other | Admitting: Nurse Practitioner

## 2021-10-06 ENCOUNTER — Encounter: Payer: Self-pay | Admitting: Nurse Practitioner

## 2021-10-06 DIAGNOSIS — Z0000 Encounter for general adult medical examination without abnormal findings: Secondary | ICD-10-CM | POA: Diagnosis not present

## 2021-10-06 NOTE — Progress Notes (Signed)
This service is provided via telemedicine  No vital signs collected/recorded due to the encounter was a telemedicine visit.   Location of patient (ex: home, work):  Home  Patient consents to a telephone visit:  Yes, see encounter dated 10/06/2021  Location of the provider (ex: office, home):  [facility]  Name of any referring provider:  N/A  Names of all persons participating in the telemedicine service and their role in the encounter:  Abbey Chatters, Nurse Practitioner, Elveria Royals, CMA, and patient.   Time spent on call:  10 minutes with medical assistant

## 2021-10-06 NOTE — Telephone Encounter (Signed)
[name], parcel are scheduled for a virtual visit with your provider today.    Just as we do with appointments in the office, we must obtain your consent to participate.  Your consent will be active for this visit and any virtual visit you may have with one of our providers in the next 365 days.    If you have a MyChart account, I can also send a copy of this consent to you electronically.  All virtual visits are billed to your insurance company just like a traditional visit in the office.  As this is a virtual visit, video technology does not allow for your provider to perform a traditional examination.  This may limit your provider's ability to fully assess your condition.  If your provider identifies any concerns that need to be evaluated in person or the need to arrange testing such as labs, EKG, etc, we will make arrangements to do so.    Although advances in technology are sophisticated, we cannot ensure that it will always work on either your end or our end.  If the connection with a video visit is poor, we may have to switch to a telephone visit.  With either a video or telephone visit, we are not always able to ensure that we have a secure connection.   I need to obtain your verbal consent now.   Are you willing to proceed with your visit today?   [name] has provided verbal consent on 10/06/2021 for a virtual visit (video or telephone).   Elveria Royals, CMA 10/06/2021  9:46 AM

## 2021-10-06 NOTE — Patient Instructions (Signed)
[name] , Thank you for taking time to come for your Medicare Wellness Visit. I appreciate your ongoing commitment to your health goals. Please review the following plan we discussed and let me know if I can assist you in the future.   Screening recommendations/referrals: Colonoscopy aged out Recommended yearly ophthalmology/optometry visit for glaucoma screening and checkup Recommended yearly dental visit for hygiene and checkup  Vaccinations: Influenza vaccine up to date Pneumococcal vaccine up to date Tdap vaccine RECOMMENDED- to get at local pharmacy Shingles vaccine up to date    Advanced directives: recommended to complete and place on file.   Conditions/risks identified: advanced age. Obesity, sedentary lifestyle.   Next appointment: yearly for awv   Preventive Care 78 Years and Older, Male Preventive care refers to lifestyle choices and visits with your health care provider that can promote health and wellness. What does preventive care include? A yearly physical exam. This is also called an annual well check. Dental exams once or twice a year. Routine eye exams. Ask your health care provider how often you should have your eyes checked. Personal lifestyle choices, including: Daily care of your teeth and gums. Regular physical activity. Eating a healthy diet. Avoiding tobacco and drug use. Limiting alcohol use. Practicing safe sex. Taking low doses of aspirin every day. Taking vitamin and mineral supplements as recommended by your health care provider. What happens during an annual well check? The services and screenings done by your health care provider during your annual well check will depend on your age, overall health, lifestyle risk factors, and family history of disease. Counseling  Your health care provider may ask you questions about your: Alcohol use. Tobacco use. Drug use. Emotional well-being. Home and relationship well-being. Sexual activity. Eating  habits. History of falls. Memory and ability to understand (cognition). Work and work Astronomer. Screening  You may have the following tests or measurements: Height, weight, and BMI. Blood pressure. Lipid and cholesterol levels. These may be checked every 5 years, or more frequently if you are over 62 years old. Skin check. Lung cancer screening. You may have this screening every year starting at age 78 if you have a 30-pack-year history of smoking and currently smoke or have quit within the past 15 years. Fecal occult blood test (FOBT) of the stool. You may have this test every year starting at age 78. Flexible sigmoidoscopy or colonoscopy. You may have a sigmoidoscopy every 5 years or a colonoscopy every 10 years starting at age 78. Prostate cancer screening. Recommendations will vary depending on your family history and other risks. Hepatitis C blood test. Hepatitis B blood test. Sexually transmitted disease (STD) testing. Diabetes screening. This is done by checking your blood sugar (glucose) after you have not eaten for a while (fasting). You may have this done every 1-3 years. Abdominal aortic aneurysm (AAA) screening. You may need this if you are a current or former smoker. Osteoporosis. You may be screened starting at age 78 if you are at high risk. Talk with your health care provider about your test results, treatment options, and if necessary, the need for more tests. Vaccines  Your health care provider may recommend certain vaccines, such as: Influenza vaccine. This is recommended every year. Tetanus, diphtheria, and acellular pertussis (Tdap, Td) vaccine. You may need a Td booster every 10 years. Zoster vaccine. You may need this after age 78. Pneumococcal 13-valent conjugate (PCV13) vaccine. One dose is recommended after age 78. Pneumococcal polysaccharide (PPSV23) vaccine. One dose is recommended  after age 78. Talk to your health care provider about which screenings and  vaccines you need and how often you need them. This information is not intended to replace advice given to you by your health care provider. Make sure you discuss any questions you have with your health care provider. Document Released: 11/07/2015 Document Revised: 06/30/2016 Document Reviewed: 08/12/2015 Elsevier Interactive Patient Education  2017 [place] Prevention in the Home Falls can cause injuries. They can happen to people of all ages. There are many things you can do to make your home safe and to help prevent falls. What can I do on the outside of my home? Regularly fix the edges of walkways and driveways and fix any cracks. Remove anything that might make you trip as you walk through a door, such as a raised step or threshold. Trim any bushes or trees on the path to your home. Use bright outdoor lighting. Clear any walking paths of anything that might make someone trip, such as rocks or tools. Regularly check to see if handrails are loose or broken. Make sure that both sides of any steps have handrails. Any raised decks and porches should have guardrails on the edges. Have any leaves, snow, or ice cleared regularly. Use sand or salt on walking paths during winter. Clean up any spills in your garage right away. This includes oil or grease spills. What can I do in the bathroom? Use night lights. Install grab bars by the toilet and in the tub and shower. Do not use towel bars as grab bars. Use non-skid mats or decals in the tub or shower. If you need to sit down in the shower, use a plastic, non-slip stool. Keep the floor dry. Clean up any water that spills on the floor as soon as it happens. Remove soap buildup in the tub or shower regularly. Attach bath mats securely with double-sided non-slip rug tape. Do not have throw rugs and other things on the floor that can make you trip. What can I do in the bedroom? Use night lights. Make sure that you have a light by your  bed that is easy to reach. Do not use any sheets or blankets that are too big for your bed. They should not hang down onto the floor. Have a firm chair that has side arms. You can use this for support while you get dressed. Do not have throw rugs and other things on the floor that can make you trip. What can I do in the kitchen? Clean up any spills right away. Avoid walking on wet floors. Keep items that you use a lot in easy-to-reach places. If you need to reach something above you, use a strong step stool that has a grab bar. Keep electrical cords out of the way. Do not use floor polish or wax that makes floors slippery. If you must use wax, use non-skid floor wax. Do not have throw rugs and other things on the floor that can make you trip. What can I do with my stairs? Do not leave any items on the stairs. Make sure that there are handrails on both sides of the stairs and use them. Fix handrails that are broken or loose. Make sure that handrails are as long as the stairways. Check any carpeting to make sure that it is firmly attached to the stairs. Fix any carpet that is loose or worn. Avoid having throw rugs at the top or bottom of the stairs. If you  do have throw rugs, attach them to the floor with carpet tape. Make sure that you have a light switch at the top of the stairs and the bottom of the stairs. If you do not have them, ask someone to add them for you. What else can I do to help prevent falls? Wear shoes that: Do not have high heels. Have rubber bottoms. Are comfortable and fit you well. Are closed at the toe. Do not wear sandals. If you use a stepladder: Make sure that it is fully opened. Do not climb a closed stepladder. Make sure that both sides of the stepladder are locked into place. Ask someone to hold it for you, if possible. Clearly mark and make sure that you can see: Any grab bars or handrails. First and last steps. Where the edge of each step is. Use tools that  help you move around (mobility aids) if they are needed. These include: Canes. Walkers. Scooters. Crutches. Turn on the lights when you go into a dark area. Replace any light bulbs as soon as they burn out. Set up your furniture so you have a clear path. Avoid moving your furniture around. If any of your floors are uneven, fix them. If there are any pets around you, be aware of where they are. Review your medicines with your doctor. Some medicines can make you feel dizzy. This can increase your chance of falling. Ask your doctor what other things that you can do to help prevent falls. This information is not intended to replace advice given to you by your health care provider. Make sure you discuss any questions you have with your health care provider. Document Released: 08/07/2009 Document Revised: 03/18/2016 Document Reviewed: 11/15/2014 Elsevier Interactive Patient Education  2017 [employer].

## 2021-10-06 NOTE — Progress Notes (Signed)
Subjective:   [name] is a 80 y.o. male who presents for Medicare Annual/Subsequent preventive examination.  Review of Systems           Objective:    There were no vitals filed for this visit. There is no height or weight on file to calculate BMI.  Advanced Directives 10/06/2021 03/25/2021 10/03/2020 09/24/2020 03/14/2020 10/02/2019 09/14/2019  Does Patient Have a Medical Advance Directive? No Yes No No No No Yes  Type of Advance Directive - - - - - - Living will  Does patient want to make changes to medical advance directive? - Yes (MAU/Ambulatory/Procedural Areas - Information given) Yes (MAU/Ambulatory/Procedural Areas - Information given) Yes (MAU/Ambulatory/Procedural Areas - Information given) - Yes (MAU/Ambulatory/Procedural Areas - Information given) No - Patient declined  Would patient like information on creating a medical advance directive? - - - - No - Patient declined - -    Current Medications (verified) Outpatient Encounter Medications as of 10/06/2021  Medication Sig   amLODipine (NORVASC) 10 MG tablet TAKE 1 TABLET BY MOUTH DAILY   cholecalciferol (VITAMIN D) 25 MCG (1000 UT) tablet Take 2,000 Units by mouth daily.    levothyroxine (SYNTHROID) 175 MCG tablet TAKE 1 TABLET BY MOUTH DAILY BEFORE BREAKFAST   losartan (COZAAR) 100 MG tablet TAKE 1 TABLET BY MOUTH DAILY   lovastatin (MEVACOR) 40 MG tablet TAKE 1 TABLET BY MOUTH AT BEDTIME   Omega-3 Fatty Acids (FISH OIL PO) Take 2,000 mg by mouth daily.   sildenafil (REVATIO) 20 MG tablet Take 2-5 tabs PRN prior to sexual activity   No facility-administered encounter medications on file as of 10/06/2021.    Allergies (verified) Paregoric   History: Past Medical History:  Diagnosis Date   BPH (benign prostatic hypertrophy)    CKD (chronic kidney disease)    ED (erectile dysfunction)    Essential hypertension, benign    Hypothyroidism    Right inguinal hernia    Past Surgical History:  Procedure  Laterality Date   APPENDECTOMY     COLONOSCOPY  2017   Per [facility] New Patient Packet    COLONOSCOPY  09/10/2020   Dr.Magod, awaiting biopsy results. 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   Procedure: APPLICATION DIGIT WIDGET LET MIDDLE FINGER;  Surgeon: Cindee Salt, MD;  Location: [facility];  Service: Orthopedics;  Laterality: Left;   HERNIA REPAIR     abd   REPAIR EXTENSOR TENDON Left 09/07/2016   Procedure: Reconstruction PULLEY sheath left middle finger Extensor retinaculum graft;  Surgeon: Cindee Salt, MD;  Location: [facility];  Service: Orthopedics;  Laterality: Left;   Family History  Problem Relation Age of Onset   Parkinson's disease Mother    Stroke Father    Diabetes Father    Heart disease Father    Diabetes Brother    Diabetes Brother    High Cholesterol Brother    Neuropathy Brother    Social History   Socioeconomic History   Marital status: Married    Spouse name: Not on file   Number of children: 3   Years of education: Not on file   Highest education level: Not on file  Occupational History   Occupation: Textiles  Tobacco Use   Smoking status: Former    Packs/day: 0.70    Years: 30.00    Pack years: 21.00    Types: Cigarettes    Quit date: 10/25/1992    Years since quitting: 28.9  Smokeless tobacco: Never  Vaping Use   Vaping Use: Never used  Substance and Sexual Activity   Alcohol use: Yes    Comment: Rare    Drug use: No   Sexual activity: Not on file  Other Topics Concern   Not on file  Social History Narrative   Lives with wife.        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 No   Do you have difficulty preparing food or eating? No   Do you have difficulty managing your medications? No   Do you have difficulty managing your finances? No   Do you have difficulty affording your medications? No   Social Determinants of Corporate investment banker Strain: Not on file  Food Insecurity: Not on file  Transportation Needs: Not on file  Physical Activity: Not on file  Stress: Not on file  Social Connections: Not on file    Tobacco Counseling Counseling given: Not Answered   Clinical Intake:                 Diabetic?no         Activities of Daily Living No flowsheet data found.  Patient Care Team: [name], NP as PCP - General (Geriatric Medicine) Vida Rigger, MD as Consulting Physician (Gastroenterology) Rollene Rotunda, MD as Consulting Physician (Cardiology) Johnney Ou, MD as Referring Physician (Ophthalmology)  Indicate any recent Medical Services you may have received from other than [facility] providers in the past year (date may be approximate).     Assessment:   This is a routine wellness examination for [name].  Hearing/Vision screen Hearing Screening - Comments:: Patien has no hearing problems. Vision Screening - Comments:: Patient has no vision problems. Patient had eye exam within past year. Dr. Valere Dross  Dietary issues and exercise activities discussed:     Goals Addressed   None    Depression Screen PHQ 2/9 Scores 10/06/2021 10/03/2020 09/24/2020 09/14/2019 07/04/2019 08/30/2018 02/27/2018  PHQ - 2 Score 0 0 0 0 0 0 0    Fall Risk Fall Risk  10/06/2021 10/03/2020 09/24/2020 03/14/2020 10/02/2019  Falls in the past year? 0 0 0 1 1  Number falls in past yr: 0 0 0 1 1  Comment - - - - Fell out of bed  Injury with  Fall? 0 0 0 0 1  Comment - - - - Head injury - wife is an Char[employer] fundraiser - used  butterfly strips  Risk for fall due to : No Fall Risks - - History of fall(s) -  Follow up Falls evaluation completed - - - -    FALL RISK PREVENTION PERTAINING TO THE HOME:  Any stairs in or around the home? Yes  If so, are there any without handrails? No  Home free of loose throw rugs in walkways, pet beds, electrical cords, etc? Yes  Adequate lighting in your home to reduce risk of falls? Yes   ASSISTIVE DEVICES UTILIZED TO PREVENT FALLS:  Life alert? No  Use of a cane, walker or w/c? No  Grab bars in the bathroom? Yes  Shower chair or bench in shower? No  Elevated toilet seat or a handicapped toilet? Yes   TIMED UP AND GO:  Was the test performed? No .    Cognitive Function:     6CIT Screen 10/06/2021 10/03/2020 10/02/2019  What Year? 0 points 0 points 0 points  What month? 0 points 0 points 0 points  What time? 0 points 0 points 0 points  Count back from 20 0 points 0 points 0 points  Months in reverse 0 points 0 points 0 points  Repeat phrase 0 points 0 points 0 points  Total Score 0 0 0    Immunizations Immunization History  Administered Date(s) Administered   Fluad Quad(high Dose 65+) 07/04/2019, 09/24/2020   Influenza Whole 10/02/2012   Influenza, High Dose Seasonal PF 08/16/2016, 08/12/2017, 08/30/2018, 10/05/2021   Influenza,inj,Quad PF,6+ Mos 08/06/2013, 08/30/2014, 08/15/2015   Moderna Sars-Covid-2 Vaccination 11/14/2019, 12/17/2019, 09/02/2020, 04/08/2021   Pneumococcal Conjugate-13 08/06/2013   Pneumococcal Polysaccharide-23 11/25/2008   Tdap 07/26/2011   Zoster Recombinat (Shingrix) 05/23/2020   Zoster, Live 01/24/2008    TDAP status: Due, Education has been provided regarding the importance of this vaccine. Advised may receive this vaccine at local pharmacy or Health Dept. Aware to provide a copy of the vaccination record if obtained from local pharmacy or Health Dept. Verbalized  acceptance and understanding.  Flu Vaccine status: Up to date  Pneumococcal vaccine status: Up to date  Covid-19 vaccine status: Information provided on how to obtain vaccines.   Qualifies for Shingles Vaccine? Yes   Zostavax completed Yes   Shingrix Completed?: No.    Education has been provided regarding the importance of this vaccine. Patient has been advised to call insurance company to determine out of pocket expense if they have not yet received this vaccine. Advised may also receive vaccine at local pharmacy or Health Dept. Verbalized acceptance and understanding.  Screening Tests Health Maintenance  Topic Date Due   Zoster Vaccines- Shingrix (2 of 2) 07/18/2020   COVID-19 Vaccine (5 - Booster for Moderna series) 06/03/2021   TETANUS/TDAP  07/25/2021   Pneumonia Vaccine 47+ Years old  Completed   INFLUENZA VACCINE  Completed   Hepatitis C Screening  Completed   HPV VACCINES  Aged Out   COLONOSCOPY (Pts 45-30yrs Insurance coverage will need to be confirmed)  Discontinued    Health Maintenance  Health Maintenance Due  Topic Date Due   Zoster Vaccines- Shingrix (2 of 2) 07/18/2020   COVID-19 Vaccine (5 - Booster for Moderna series) 06/03/2021   TETANUS/TDAP  07/25/2021    Colorectal cancer screening: No longer required.   Lung Cancer Screening: (Low Dose CT Chest recommended if Age 31-80 years, 30 pack-year currently smoking OR have quit w/in 15years.) does not qualify.   Lung Cancer Screening Referral: na  Additional Screening:  Hepatitis C Screening: does qualify; Completed   Vision Screening: Recommended annual ophthalmology exams for early detection of glaucoma and other disorders of the eye. Is the patient up to date with their annual eye exam?  Yes  Who is the provider or what is the name of the office in which the patient attends annual eye exams? [facility] If  pt is not established with a provider, would they like to be referred to a provider to establish  care? No .   Dental Screening: Recommended annual dental exams for proper oral hygiene  Community Resource Referral / Chronic Care Management: CRR required this visit?  No   CCM required this visit?  No      Plan:     I have personally reviewed and noted the following in the patients chart:   Medical and social history Use of alcohol, tobacco or illicit drugs  Current medications and supplements including opioid prescriptions. Patient is not currently taking opioid prescriptions. Functional ability and status Nutritional status Physical activity Advanced directives List of other physicians Hospitalizations, surgeries, and ER visits in previous 12 months Vitals Screenings to include cognitive, depression, and falls Referrals and appointments  In addition, I have reviewed and discussed with patient certain preventive protocols, quality metrics, and best practice recommendations. A written personalized care plan for preventive services as well as general preventive health recommendations were provided to patient.     [name], NP   10/06/2021    Virtual Visit via Telephone Note  I connected [email or web address] on 10/06/21 at  9:30 AM EST by telephone and verified that I am speaking with the correct person using two identifiers.  Location: Patient: home Provider: [facility]   I discussed the limitations, risks, security and privacy concerns of performing an evaluation and management service by telephone and the availability of in person appointments. I also discussed with the patient that there may be a patient responsible charge related to this service. The patient expressed understanding and agreed to proceed.   I discussed the assessment and treatment plan with the patient. The patient was provided an opportunity to ask questions and all were answered. The patient agreed with the plan and demonstrated an understanding of the instructions.   The patient was advised to  call back or seek an in-person evaluation if the symptoms worsen or if the condition fails to improve as anticipated.  I provided 15 minutes of non-face-to-face time during this encounter.  Janene Harvey. [employer] printed and mailed

## 2021-10-13 IMAGING — CR DG CHEST 2V
2 series · 2 of 2 positions shown · non-contrast
Comparison: 08/12/2017

CLINICAL DATA: Intermittent cough, previous tobacco abuse

EXAM:
CHEST - 2 VIEW

[w chest pa]
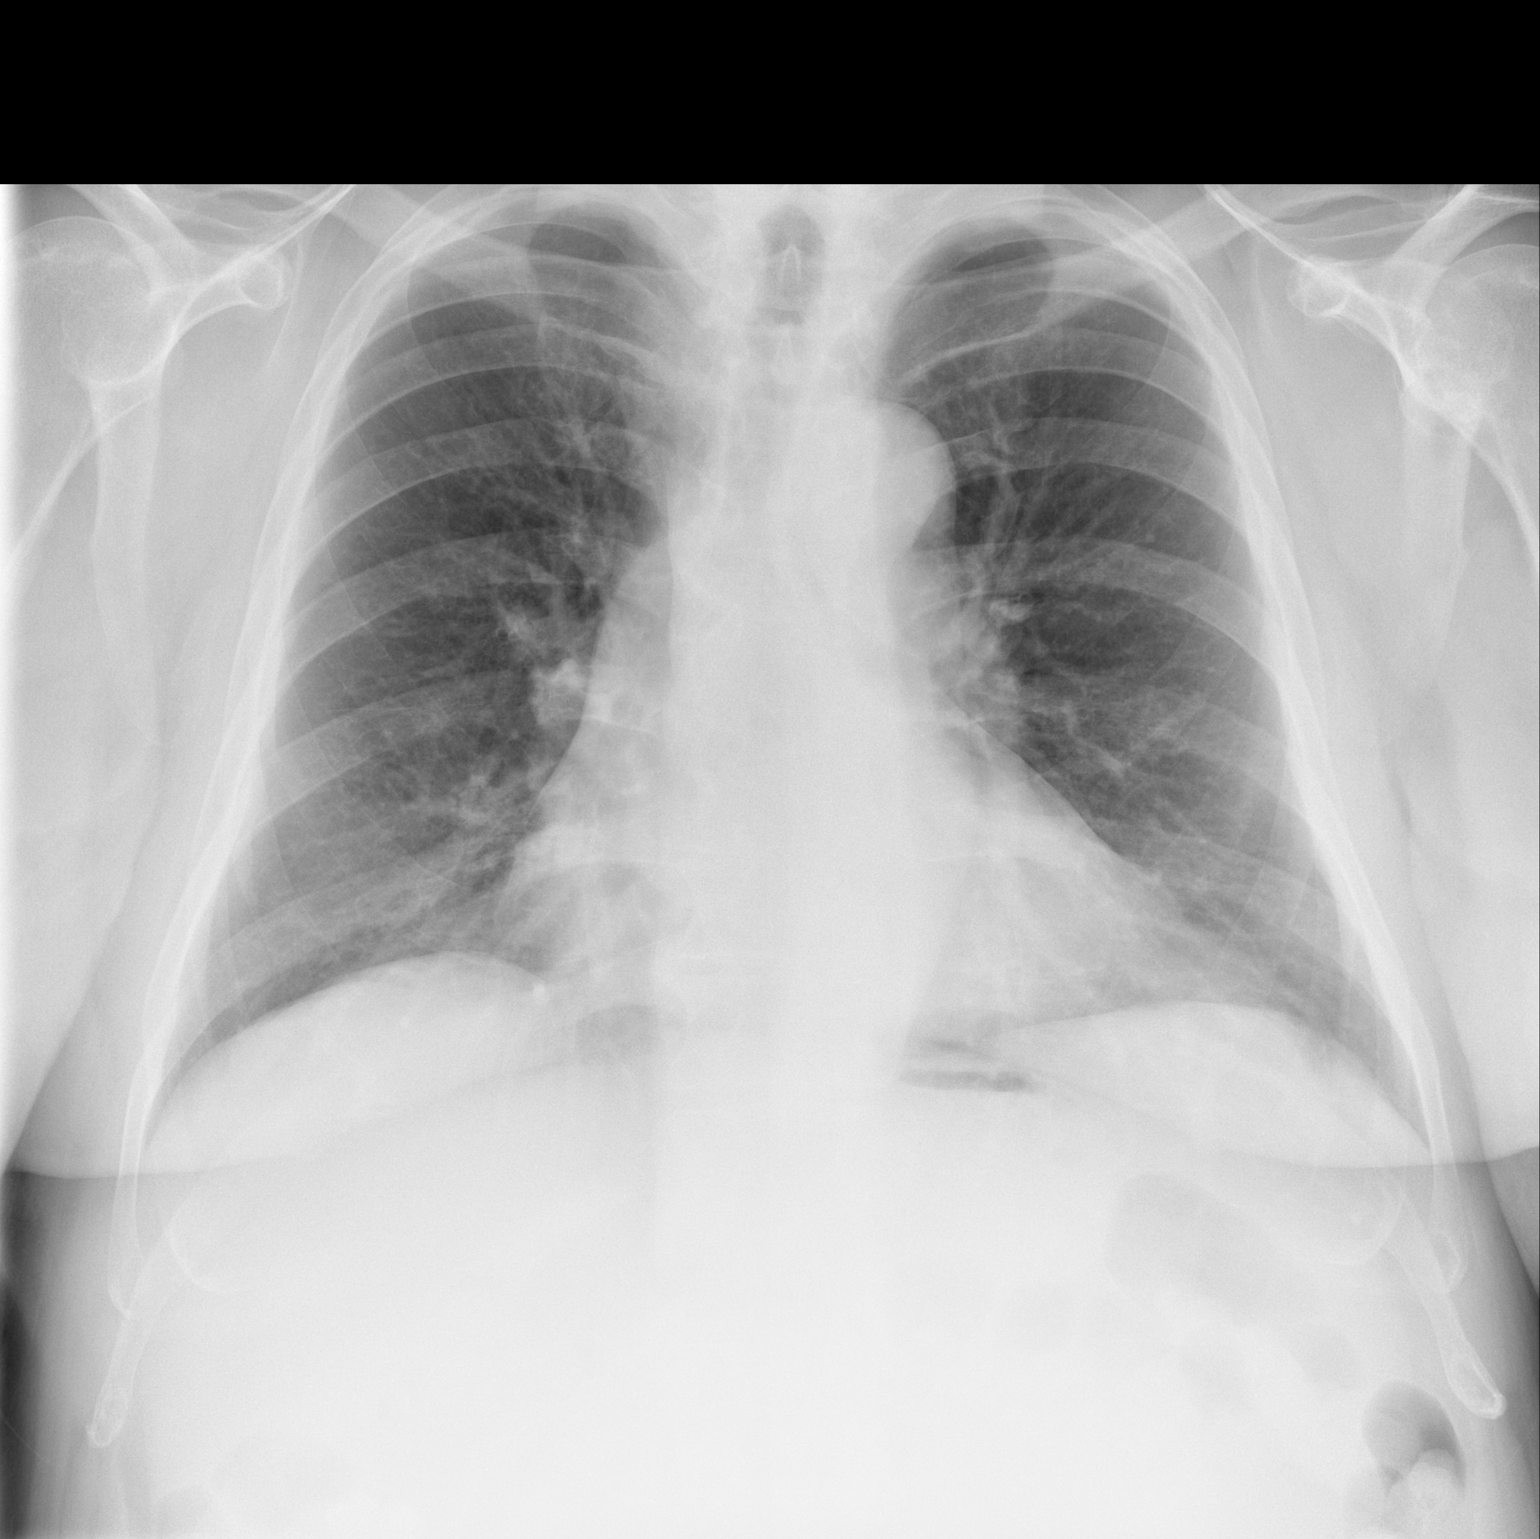

[w chest lat]
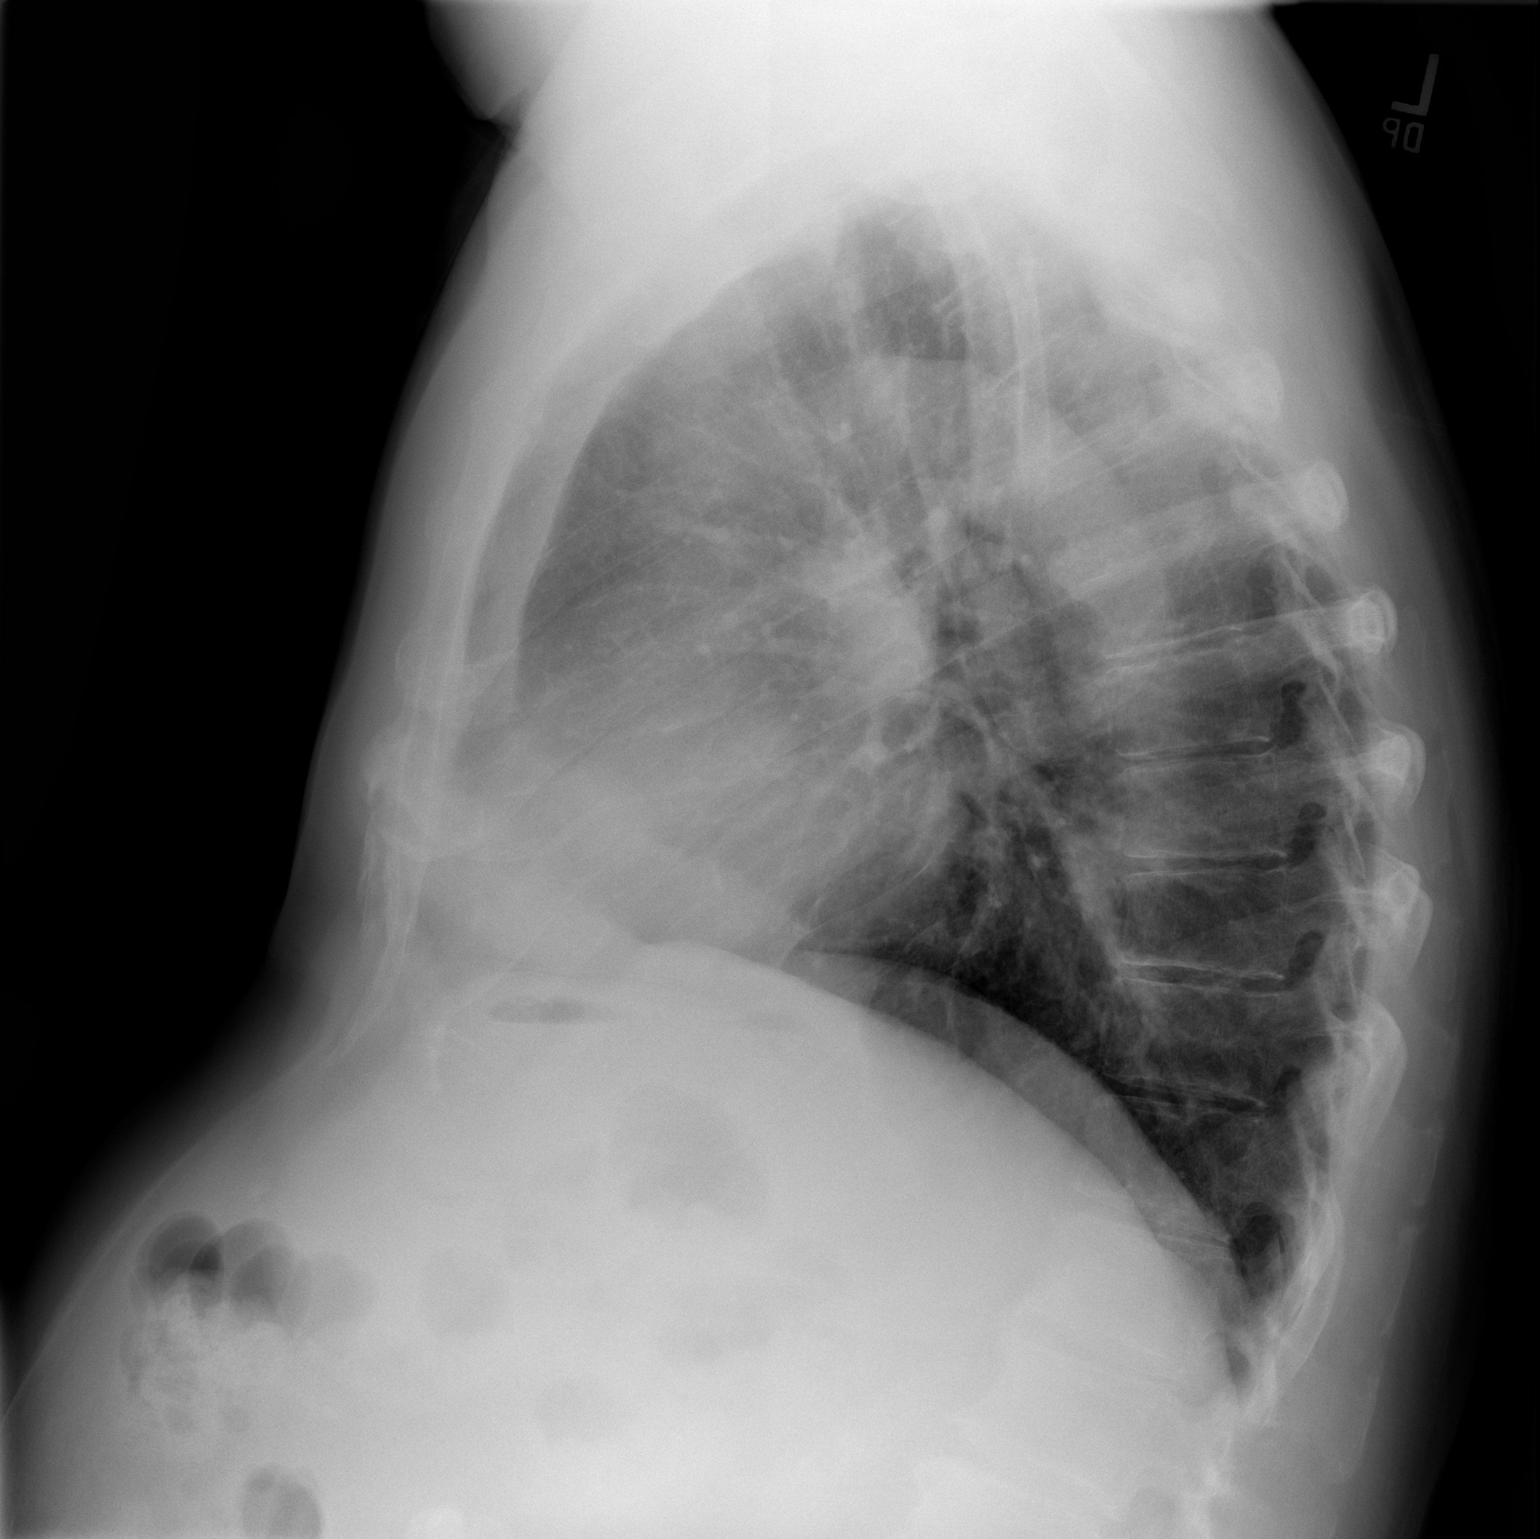

[2 of 2 positions shown; findings below may reference images not displayed]

FINDINGS: The heart size and mediastinal contours are within normal limits.
Both lungs are clear. The visualized skeletal structures are
unremarkable.
IMPRESSION: No active cardiopulmonary disease.

## 2021-12-14 DIAGNOSIS — H04123 Dry eye syndrome of bilateral lacrimal glands: Secondary | ICD-10-CM | POA: Diagnosis not present

## 2021-12-14 DIAGNOSIS — H26493 Other secondary cataract, bilateral: Secondary | ICD-10-CM | POA: Diagnosis not present

## 2021-12-14 DIAGNOSIS — H524 Presbyopia: Secondary | ICD-10-CM | POA: Diagnosis not present

## 2021-12-23 DIAGNOSIS — M1711 Unilateral primary osteoarthritis, right knee: Secondary | ICD-10-CM | POA: Diagnosis not present

## 2021-12-23 DIAGNOSIS — M25561 Pain in right knee: Secondary | ICD-10-CM | POA: Diagnosis not present

## 2021-12-23 DIAGNOSIS — M25461 Effusion, right knee: Secondary | ICD-10-CM | POA: Diagnosis not present

## 2022-01-25 ENCOUNTER — Encounter: Payer: Self-pay | Admitting: Nurse Practitioner

## 2022-01-25 ENCOUNTER — Ambulatory Visit (INDEPENDENT_AMBULATORY_CARE_PROVIDER_SITE_OTHER): Payer: Medicare Other | Admitting: Nurse Practitioner

## 2022-01-25 VITALS — BP 126/80 | HR 74 | Temp 97.9°F | Ht 72.0 in | Wt 259.0 lb

## 2022-01-25 DIAGNOSIS — R739 Hyperglycemia, unspecified: Secondary | ICD-10-CM

## 2022-01-25 DIAGNOSIS — N1832 Chronic kidney disease, stage 3b: Secondary | ICD-10-CM

## 2022-01-25 DIAGNOSIS — M170 Bilateral primary osteoarthritis of knee: Secondary | ICD-10-CM | POA: Diagnosis not present

## 2022-01-25 DIAGNOSIS — E7800 Pure hypercholesterolemia, unspecified: Secondary | ICD-10-CM

## 2022-01-25 DIAGNOSIS — E039 Hypothyroidism, unspecified: Secondary | ICD-10-CM | POA: Diagnosis not present

## 2022-01-25 DIAGNOSIS — E6609 Other obesity due to excess calories: Secondary | ICD-10-CM

## 2022-01-25 DIAGNOSIS — Z6830 Body mass index (BMI) 30.0-30.9, adult: Secondary | ICD-10-CM

## 2022-01-25 DIAGNOSIS — J302 Other seasonal allergic rhinitis: Secondary | ICD-10-CM

## 2022-01-25 DIAGNOSIS — I10 Essential (primary) hypertension: Secondary | ICD-10-CM

## 2022-01-25 DIAGNOSIS — E559 Vitamin D deficiency, unspecified: Secondary | ICD-10-CM

## 2022-01-25 NOTE — Progress Notes (Signed)
? ? ?Careteam: ?Patient Care Team: ?Lauree Chandler, NP as PCP - General (Geriatric Medicine) ?Clarene Essex, MD as Consulting Physician (Gastroenterology) ?Minus Breeding, MD as Consulting Physician (Cardiology) ?Emeline Gins, MD as Referring Physician (Ophthalmology) ? ?PLACE OF SERVICE:  ?[facility]  ?Advanced Directive information ?Does Patient Have a Medical Advance Directive?: No, Would patient like information on creating a medical advance directive?: Yes (MAU/Ambulatory/Procedural Areas - Information given) ? ?Allergies  ?Allergen Reactions  ? Paregoric   ? ? ?Chief Complaint  ?Patient presents with  ? Medical Management of Chronic Issues  ?  6 month follow-up. Discuss need for shingrix, covid boosters and td/tdap or post pone if patient refuses.   ? ? ? ?HPI: Patient is a 81 y.o. male for 6 months follow up  ? ?Patient has not been feeling like himself the past few days, feeling more tired, had vertigo,brief light headedness. Has tried OTC allergy relief pill and has helped some. Feeling better today. Reports he has been doing a lot of activities, cleaning the house and yard work to get ready for visitors which had him very fatigue.  ? ?Hypothyroidism: Takes Levothyroxine 175 mcg daily as prescribed. No issues or concerns.  ? ?Osteoarthritis: Mild pain, does not take anything for it. Pt was evaluated by orthopedics in January. No concerns. ? ?Hypertension: Losartan 100 mg QD and Amlodipine 10 mg QD. Runs 126-127/ 70's at home. No issues or concerns.  ? ?Exercise: walks about 1 mile, 5 times per week, plays golf, and does yard work.  ? ?Diet: Described as a balanced diet, eats well. ? ?Got Shingrix and Tdap at pharmacy  ? ?Will get Covid booster when convenient ? ? ?Review of Systems:  ?Review of Systems  ?Constitutional:  Positive for malaise/fatigue. Negative for chills, diaphoresis, fever and weight loss.  ?HENT:  Positive for congestion. Negative for ear discharge, ear pain, sinus pain and  sore throat.   ?     Has nasal congestion. Denies watery eyes, and sore throat.   ?Respiratory:  Negative for cough, sputum production, shortness of breath and wheezing.   ?Cardiovascular:  Negative for chest pain, palpitations, orthopnea and leg swelling.  ?Gastrointestinal:  Negative for abdominal pain, constipation, diarrhea, heartburn, melena, nausea and vomiting.  ?Genitourinary:  Negative for dysuria, frequency, hematuria and urgency.  ?Musculoskeletal:  Positive for joint pain. Negative for myalgias.  ?     Has some knee pain when going up and down stairs.   ?Psychiatric/Behavioral:  The patient does not have insomnia.   ? ?Past Medical History:  ?Diagnosis Date  ? BPH (benign prostatic hypertrophy)   ? CKD (chronic kidney disease)   ? ED (erectile dysfunction)   ? Essential hypertension, benign   ? Hypothyroidism   ? Right inguinal hernia   ? ?Past Surgical History:  ?Procedure Laterality Date  ? APPENDECTOMY    ? COLONOSCOPY  2017  ? Per [facility] New Patient Packet   ? COLONOSCOPY  09/10/2020  ? Dr.Magod, awaiting biopsy results. Polyps present   ? 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 Procedure: APPLICATION DIGIT WIDGET LET MIDDLE FINGER;  Surgeon: Daryll Brod, MD;  Location: [facility];  Service: Orthopedics;  Laterality: Left;  ? HERNIA REPAIR    ? abd  ? REPAIR EXTENSOR TENDON Left 09/07/2016  ? Procedure: Reconstruction PULLEY sheath left middle finger Extensor retinaculum graft;  Surgeon: Daryll Brod, MD;  Location: [place];  Service: Orthopedics;  Laterality: Left;  ? ?Social  History: ?  reports that he quit smoking about 29 years ago. His smoking use included cigarettes. He has a 21.00 pack-year smoking history. He has never used smokeless tobacco. He reports current alcohol use. He reports that he does not use drugs. ? ?Family History  ?Problem Relation Age of Onset  ? Parkinson's disease Mother   ? Stroke Father   ? Diabetes Father   ? Heart disease Father   ?  Diabetes Brother   ? Myasthenia gravis Brother   ? Diabetes Brother   ? High Cholesterol Brother   ? Neuropathy Brother   ? ? ?Medications: ?Patient's Medications  ?New Prescriptions  ? No medications on file  ?Previous Medications  ? AMLODIPINE (NORVASC) 10 MG TABLET    TAKE 1 TABLET BY MOUTH DAILY  ? CHOLECALCIFEROL (VITAMIN D) 25 MCG (1000 UT) TABLET    Take 2,000 Units by mouth daily.   ? LEVOTHYROXINE (SYNTHROID) 175 MCG TABLET    TAKE 1 TABLET BY MOUTH DAILY BEFORE BREAKFAST  ? LOSARTAN (COZAAR) 100 MG TABLET    TAKE 1 TABLET BY MOUTH DAILY  ? LOVASTATIN (MEVACOR) 40 MG TABLET    TAKE 1 TABLET BY MOUTH AT BEDTIME  ? OMEGA-3 FATTY ACIDS (FISH OIL PO)    Take 2,000 mg by mouth daily.  ? SILDENAFIL (REVATIO) 20 MG TABLET    Take 2-5 tabs PRN prior to sexual activity  ?Modified Medications  ? No medications on file  ?Discontinued Medications  ? No medications on file  ? ? ?Physical Exam: ? ?Vitals:  ? 01/25/22 1038  ?BP: 126/80  ?Pulse: 74  ?Temp: 97.9 ?F (36.6 ?C)  ?TempSrc: Temporal  ?SpO2: 98%  ?Weight: 259 lb (117.5 kg)  ?Height: 6' (1.829 m)  ? ?Body mass index is 35.13 kg/m?. ?Wt Readings from Last 3 Encounters:  ?01/25/22 259 lb (117.5 kg)  ?03/25/21 250 lb 6.4 oz (113.6 kg)  ?09/24/20 256 lb (116.1 kg)  ? ? ?Physical Exam ?Constitutional:   ?   General: He is not in acute distress. ?   Appearance: Normal appearance. He is obese. He is not ill-appearing.  ?HENT:  ?   Right Ear: Tympanic membrane, ear canal and external ear normal.  ?   Left Ear: Tympanic membrane, ear canal and external ear normal.  ?   Mouth/Throat:  ?   Mouth: Mucous membranes are moist.  ?   Pharynx: Oropharynx is clear.  ?Eyes:  ?   Conjunctiva/sclera: Conjunctivae normal.  ?Cardiovascular:  ?   Rate and Rhythm: Normal rate and regular rhythm.  ?   Pulses: Normal pulses.  ?   Heart sounds: Normal heart sounds.  ?Pulmonary:  ?   Effort: Pulmonary effort is normal.  ?   Breath sounds: Normal breath sounds.  ?Abdominal:  ?   General: Bowel  sounds are normal. There is no distension.  ?   Palpations: Abdomen is soft. There is no mass.  ?   Tenderness: There is no abdominal tenderness. There is no guarding.  ?   Hernia: No hernia is present.  ?Musculoskeletal:     ?   General: Normal range of motion.  ?Skin: ?   General: Skin is warm.  ?Neurological:  ?   Mental Status: He is alert and oriented to person, place, and time.  ?Psychiatric:     ?   Mood and Affect: Mood normal.     ?   Behavior: Behavior normal.     ?   Thought  Content: Thought content normal.  ? ? ?Labs reviewed: ?Basic Metabolic Panel: ?Recent Labs  ?  03/25/21 ?1243  ?NA 140  ?K 4.2  ?CL 104  ?CO2 26  ?GLUCOSE 109*  ?BUN 25  ?CREATININE 1.83*  ?CALCIUM 9.4  ?TSH 5.02*  ? ?Liver Function Tests: ?Recent Labs  ?  03/25/21 ?1243  ?AST 28  ?ALT 30  ?BILITOT 1.2  ?PROT 7.1  ? ?No results for input(s): LIPASE, AMYLASE in the last 8760 hours. ?No results for input(s): AMMONIA in the last 8760 hours. ?CBC: ?Recent Labs  ?  03/25/21 ?1243  ?WBC 7.0  ?NEUTROABS 3,584  ?HGB 15.5  ?HCT 45.3  ?MCV 91.7  ?PLT 257  ? ?Lipid Panel: ?No results for input(s): CHOL, HDL, LDLCALC, TRIG, CHOLHDL, LDLDIRECT in the last 8760 hours. ?TSH: ?Recent Labs  ?  03/25/21 ?1243  ?TSH 5.02*  ? ?A1C: ?Lab Results  ?Component Value Date  ? HGBA1C 6.4 (H) 03/25/2021  ? ? ? ?Assessment/Plan ? ?1. Primary osteoarthritis of both knees ?-Mild knee pain, does not take medication for it. Had acute pain that has now resolved.  ? ?2. Stage 3b chronic kidney disease ([place]) ?- CMP ordered to reevaluate kidney function ?- Last Cr 1.83 and GFR 35 on  03/25/21, normal BUN  ?- Patient advised to stay hydrated with water ?- No issues or concerns  ?-avoid NSAIDS ? ?3. Hyperglycemia ?- A1C was 6.4 on 03/25/21. ?- Hemoglobin A1c ordered ?- Balanced diet and exercise encouraged  ?- Will assess this office visit. ? ?4. Essential hypertension ?- In office BP 126/80 ?- Bp stable, controlled, continue current regimen  ?-Labs ordered:  ?- CBC with  Differential/Platelet ?- CMP with eGFR([facility]) ? ?5. Hypothyroidism, unspecified type ?-Takes 175 mcg levothyroxine QAM ?-Last TSH was 5.02,  03/25/21 ?- TSH ordered ? ?6. Vitamin D deficiency ?-Takes Vit D 200

## 2022-01-25 NOTE — Patient Instructions (Addendum)
To start nettipot daily  ?Can use flonase after nettipot if needed ?Continue otc antihistamine- zyrtec, claritin.  ?

## 2022-01-26 LAB — HEMOGLOBIN A1C
Hgb A1c MFr Bld: 7.2 % of total Hgb — ABNORMAL HIGH (ref <5.7)
Mean Plasma Glucose: 160 mg/dL
eAG (mmol/L): 8.9 mmol/L

## 2022-01-26 LAB — COMPLETE METABOLIC PANEL WITH GFR
AG Ratio: 1.7 (calc) (ref 1.0–2.5)
ALT: 30 U/L (ref 9–46)
AST: 26 U/L (ref 10–35)
Albumin: 4.3 g/dL (ref 3.6–5.1)
Alkaline phosphatase (APISO): 61 U/L (ref 35–144)
BUN/Creatinine Ratio: 15 (calc) (ref 6–22)
BUN: 24 mg/dL (ref 7–25)
CO2: 29 mmol/L (ref 20–32)
Calcium: 9.2 mg/dL (ref 8.6–10.3)
Chloride: 105 mmol/L (ref 98–110)
Creat: 1.65 mg/dL — ABNORMAL HIGH (ref 0.70–1.28)
Globulin: 2.6 g/dL (calc) (ref 1.9–3.7)
Glucose, Bld: 122 mg/dL — ABNORMAL HIGH (ref 65–99)
Potassium: 4.2 mmol/L (ref 3.5–5.3)
Sodium: 141 mmol/L (ref 135–146)
Total Bilirubin: 1 mg/dL (ref 0.2–1.2)
Total Protein: 6.9 g/dL (ref 6.1–8.1)
eGFR: 42 mL/min/{1.73_m2} — ABNORMAL LOW (ref >=60)

## 2022-01-26 LAB — CBC WITH DIFFERENTIAL/PLATELET
Absolute Monocytes: 864 cells/uL (ref 200–950)
Basophils Absolute: 101 cells/uL (ref 0–200)
Basophils Relative: 1.4 %
Eosinophils Absolute: 410 cells/uL (ref 15–500)
Eosinophils Relative: 5.7 %
HCT: 44.5 % (ref 38.5–50.0)
Hemoglobin: 15.2 g/dL (ref 13.2–17.1)
Lymphs Abs: 2066 cells/uL (ref 850–3900)
MCH: 30.8 pg (ref 27.0–33.0)
MCHC: 34.2 g/dL (ref 32.0–36.0)
MCV: 90.3 fL (ref 80.0–100.0)
MPV: 9.8 fL (ref 7.5–12.5)
Monocytes Relative: 12 %
Neutro Abs: 3758 cells/uL (ref 1500–7800)
Neutrophils Relative %: 52.2 %
Platelets: 264 10*3/uL (ref 140–400)
RBC: 4.93 10*6/uL (ref 4.20–5.80)
RDW: 12.5 % (ref 11.0–15.0)
Total Lymphocyte: 28.7 %
WBC: 7.2 10*3/uL (ref 3.8–10.8)

## 2022-01-26 LAB — LIPID PANEL
Cholesterol: 97 mg/dL (ref <200)
HDL: 41 mg/dL (ref >=40)
LDL Cholesterol (Calc): 40 mg/dL (calc)
Non-HDL Cholesterol (Calc): 56 mg/dL (calc) (ref <130)
Total CHOL/HDL Ratio: 2.4 (calc) (ref <5.0)
Triglycerides: 78 mg/dL (ref <150)

## 2022-01-26 LAB — TSH: TSH: 2.45 mIU/L (ref 0.40–4.50)

## 2022-03-01 ENCOUNTER — Other Ambulatory Visit: Payer: Self-pay | Admitting: Nurse Practitioner

## 2022-03-01 ENCOUNTER — Ambulatory Visit (INDEPENDENT_AMBULATORY_CARE_PROVIDER_SITE_OTHER): Payer: Medicare Other | Admitting: Nurse Practitioner

## 2022-03-01 ENCOUNTER — Encounter: Payer: Self-pay | Admitting: Nurse Practitioner

## 2022-03-01 VITALS — BP 130/80 | HR 68 | Temp 97.3°F | Ht 72.0 in

## 2022-03-01 DIAGNOSIS — E7800 Pure hypercholesterolemia, unspecified: Secondary | ICD-10-CM

## 2022-03-01 DIAGNOSIS — R310 Gross hematuria: Secondary | ICD-10-CM

## 2022-03-01 DIAGNOSIS — I10 Essential (primary) hypertension: Secondary | ICD-10-CM

## 2022-03-01 DIAGNOSIS — E039 Hypothyroidism, unspecified: Secondary | ICD-10-CM

## 2022-03-01 LAB — POCT URINALYSIS DIPSTICK
Bilirubin, UA: NEGATIVE
Glucose, UA: NEGATIVE
Ketones, UA: NEGATIVE
Leukocytes, UA: NEGATIVE
Nitrite, UA: NEGATIVE
Protein, UA: NEGATIVE
Spec Grav, UA: 1.02 (ref 1.010–1.025)
Urobilinogen, UA: 0.2 E.U./dL
pH, UA: 6 (ref 5.0–8.0)

## 2022-03-01 NOTE — Progress Notes (Signed)
? ? ?Careteam: ?Patient Care Team: ?Dylan Seller, NP as PCP - General (Geriatric Medicine) ?Dylan Rigger, MD as Consulting Physician (Gastroenterology) ?Dylan Rotunda, MD as Consulting Physician (Cardiology) ?Dylan Ou, MD as Referring Physician (Ophthalmology) ? ?PLACE OF SERVICE:  ?[facility]  ?Advanced Directive information ?  ? ?Allergies  ?Allergen Reactions  ? Paregoric   ? ? ?Chief Complaint  ?Patient presents with  ? Acute Visit  ?  Patient complains of blood in urine. Patient noticed blood in urine Friday afternoon. No burning on urination or abdominal pain. Urine very red. Patient states more blood than urine. Denies urinary frequency or urgency.  ? ? ? ?HPI: Patient is a 81 y.o. male due to blood in his urine.  ?Reports 4 days ago he went to the bathroom and it was blood in the toliet. Next time he went he passed a clot about the size of his little finger.  ?Urine was normal once but typically since he has some shade of red to his urine. ?He is not having any pain.  ?No back pain, no pain with urination.  ?No fever.  ?A week ago he had episode of increase in frequency. He goes frequently but generally not that frequent ?Now urination is not at frequent.  ?He sometimes gets up 4-5 times at night, sometimes twice. Not consistent.  ? ?He did go on a diabetic diet and has lost weight due to that.  ? ?Review of Systems:  ?Review of Systems  ?Constitutional:  Negative for chills and fever.  ?Genitourinary:  Positive for hematuria. Negative for dysuria, frequency and urgency.  ?Musculoskeletal:  Negative for back pain and myalgias.  ? ?Past Medical History:  ?Diagnosis Date  ? BPH (benign prostatic hypertrophy)   ? CKD (chronic kidney disease)   ? ED (erectile dysfunction)   ? Essential hypertension, benign   ? Hypothyroidism   ? Right inguinal hernia   ? ?Past Surgical History:  ?Procedure Laterality Date  ? APPENDECTOMY    ? COLONOSCOPY  2017  ? Per [facility] New Patient Packet   ? COLONOSCOPY   09/10/2020  ? Dr.Magod, awaiting biopsy results. Polyps present   ? 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 Procedure: APPLICATION DIGIT WIDGET LET MIDDLE FINGER;  Surgeon: Cindee Salt, MD;  Location: [facility];  Service: Orthopedics;  Laterality: Left;  ? HERNIA REPAIR    ? abd  ? REPAIR EXTENSOR TENDON Left 09/07/2016  ? Procedure: Reconstruction PULLEY sheath left middle finger Extensor retinaculum graft;  Surgeon: Cindee Salt, MD;  Location:  SURGERY CENTER;  Service: Orthopedics;  Laterality: Left;  ? ?Social History: ?  reports that he quit smoking about 29 years ago. His smoking use included cigarettes. He has a 21.00 pack-year smoking history. He has never used smokeless tobacco. He reports current alcohol use. He reports that he does not use drugs. ? ?Family History  ?Problem Relation Age of Onset  ? Parkinson's disease Mother   ? Stroke Father   ? Diabetes Father   ? Heart disease Father   ? Diabetes Brother   ? Myasthenia gravis Brother   ? Diabetes Brother   ? High Cholesterol Brother   ? Neuropathy Brother   ? ? ?Medications: ?Patient's Medications  ?New Prescriptions  ? No medications on file  ?Previous Medications  ? AMLODIPINE (NORVASC) 10 MG TABLET    TAKE 1 TABLET BY MOUTH DAILY  ? CHOLECALCIFEROL (VITAMIN D) 25 MCG (1000 UT) TABLET  Take 2,000 Units by mouth daily.   ? LEVOTHYROXINE (SYNTHROID) 175 MCG TABLET    TAKE 1 TABLET BY MOUTH DAILY BEFORE BREAKFAST  ? LOSARTAN (COZAAR) 100 MG TABLET    TAKE 1 TABLET BY MOUTH DAILY  ? LOVASTATIN (MEVACOR) 40 MG TABLET    TAKE 1 TABLET BY MOUTH AT BEDTIME  ? OMEGA-3 FATTY ACIDS (FISH OIL PO)    Take 2,000 mg by mouth daily.  ? SILDENAFIL (REVATIO) 20 MG TABLET    Take 2-5 tabs PRN prior to sexual activity  ?Modified Medications  ? No medications on file  ?Discontinued Medications  ? No medications on file  ? ? ?Physical Exam: ? ?Vitals:  ? 03/01/22 1107  ?BP: 130/80  ?Pulse: 68  ?Temp: (!) 97.3 ?F (36.3 ?C)  ?SpO2: 94%   ?Height: 6' (1.829 m)  ? ?Body mass index is 35.13 kg/m?. ?Wt Readings from Last 3 Encounters:  ?01/25/22 259 lb (117.5 kg)  ?03/25/21 250 lb 6.4 oz (113.6 kg)  ?09/24/20 256 lb (116.1 kg)  ? ? ?Physical Exam ?Constitutional:   ?   General: He is not in acute distress. ?   Appearance: He is well-developed. He is not diaphoretic.  ?HENT:  ?   Head: Normocephalic and atraumatic.  ?   Mouth/Throat:  ?   Pharynx: No oropharyngeal exudate.  ?Cardiovascular:  ?   Rate and Rhythm: Normal rate and regular rhythm.  ?   Heart sounds: Normal heart sounds.  ?Pulmonary:  ?   Effort: Pulmonary effort is normal.  ?   Breath sounds: Normal breath sounds.  ?Abdominal:  ?   General: Bowel sounds are normal. There is no distension.  ?   Palpations: Abdomen is soft.  ?   Tenderness: There is no abdominal tenderness. There is no right CVA tenderness, left CVA tenderness, guarding or rebound.  ?   Hernia: No hernia is present.  ?Musculoskeletal:  ?   Cervical back: Normal range of motion and neck supple.  ?Skin: ?   General: Skin is warm and dry.  ?Neurological:  ?   Mental Status: He is alert and oriented to person, place, and time.  ? ? ?Labs reviewed: ?Basic Metabolic Panel: ?Recent Labs  ?  03/25/21 ?1243 01/25/22 ?1153  ?NA 140 141  ?K 4.2 4.2  ?CL 104 105  ?CO2 26 29  ?GLUCOSE 109* 122*  ?BUN 25 24  ?CREATININE 1.83* 1.65*  ?CALCIUM 9.4 9.2  ?TSH 5.02* 2.45  ? ?Liver Function Tests: ?Recent Labs  ?  03/25/21 ?1243 01/25/22 ?1153  ?AST 28 26  ?ALT 30 30  ?BILITOT 1.2 1.0  ?PROT 7.1 6.9  ? ?No results for input(s): LIPASE, AMYLASE in the last 8760 hours. ?No results for input(s): AMMONIA in the last 8760 hours. ?CBC: ?Recent Labs  ?  03/25/21 ?1243 01/25/22 ?1153  ?WBC 7.0 7.2  ?NEUTROABS 3,584 3,758  ?HGB 15.5 15.2  ?HCT 45.3 44.5  ?MCV 91.7 90.3  ?PLT 257 264  ? ?Lipid Panel: ?Recent Labs  ?  01/25/22 ?1153  ?CHOL 97  ?HDL 41  ?LDLCALC 40  ?TRIG 78  ?CHOLHDL 2.4  ? ?TSH: ?Recent Labs  ?  03/25/21 ?1243 01/25/22 ?1153  ?TSH 5.02*  2.45  ? ?A1C: ?Lab Results  ?Component Value Date  ? HGBA1C 7.2 (H) 01/25/2022  ? ? ? ?Assessment/Plan ?1. Gross hematuria ?-that has come and gone over the last 4 days.  ?Asymptomatic at this time. ?- POC Urinalysis Dipstick- negative.  ?- Culture,  Urine ?- Ambulatory referral to Urology for further evaluation.  ?- CBC with Differential/Platelet ?- BASIC METABOLIC PANEL WITH GFR ?- PSA ? ? ?[name] K. Janyth Contes, AGNP ? ?[facility] ?[phone]  ?

## 2022-03-02 LAB — CBC WITH DIFFERENTIAL/PLATELET
Absolute Monocytes: 844 cells/uL (ref 200–950)
Basophils Absolute: 94 cells/uL (ref 0–200)
Basophils Relative: 1.4 %
Eosinophils Absolute: 295 cells/uL (ref 15–500)
Eosinophils Relative: 4.4 %
HCT: 42.9 % (ref 38.5–50.0)
Hemoglobin: 14.6 g/dL (ref 13.2–17.1)
Lymphs Abs: 2044 cells/uL (ref 850–3900)
MCH: 30.4 pg (ref 27.0–33.0)
MCHC: 34 g/dL (ref 32.0–36.0)
MCV: 89.4 fL (ref 80.0–100.0)
MPV: 9.6 fL (ref 7.5–12.5)
Monocytes Relative: 12.6 %
Neutro Abs: 3424 cells/uL (ref 1500–7800)
Neutrophils Relative %: 51.1 %
Platelets: 260 10*3/uL (ref 140–400)
RBC: 4.8 10*6/uL (ref 4.20–5.80)
RDW: 12.6 % (ref 11.0–15.0)
Total Lymphocyte: 30.5 %
WBC: 6.7 10*3/uL (ref 3.8–10.8)

## 2022-03-02 LAB — BASIC METABOLIC PANEL WITH GFR
BUN/Creatinine Ratio: 16 (calc) (ref 6–22)
BUN: 24 mg/dL (ref 7–25)
CO2: 26 mmol/L (ref 20–32)
Calcium: 9.1 mg/dL (ref 8.6–10.3)
Chloride: 105 mmol/L (ref 98–110)
Creat: 1.48 mg/dL — ABNORMAL HIGH (ref 0.70–1.28)
Glucose, Bld: 117 mg/dL (ref 65–139)
Potassium: 4 mmol/L (ref 3.5–5.3)
Sodium: 139 mmol/L (ref 135–146)
eGFR: 48 mL/min/{1.73_m2} — ABNORMAL LOW (ref >=60)

## 2022-03-02 LAB — URINE CULTURE
MICRO NUMBER:: 13365514
Result:: NO GROWTH
SPECIMEN QUALITY:: ADEQUATE

## 2022-03-02 LAB — PSA: PSA: 2.54 ng/mL (ref <=4.00)

## 2022-03-15 ENCOUNTER — Other Ambulatory Visit: Payer: Self-pay | Admitting: Nurse Practitioner

## 2022-03-15 DIAGNOSIS — I10 Essential (primary) hypertension: Secondary | ICD-10-CM

## 2022-03-15 DIAGNOSIS — E7800 Pure hypercholesterolemia, unspecified: Secondary | ICD-10-CM

## 2022-03-18 DIAGNOSIS — R319 Hematuria, unspecified: Secondary | ICD-10-CM | POA: Diagnosis not present

## 2022-03-18 DIAGNOSIS — N401 Enlarged prostate with lower urinary tract symptoms: Secondary | ICD-10-CM | POA: Diagnosis not present

## 2022-03-18 DIAGNOSIS — R351 Nocturia: Secondary | ICD-10-CM | POA: Diagnosis not present

## 2022-03-18 DIAGNOSIS — R310 Gross hematuria: Secondary | ICD-10-CM | POA: Diagnosis not present

## 2022-03-19 ENCOUNTER — Other Ambulatory Visit: Payer: Self-pay | Admitting: Nurse Practitioner

## 2022-03-19 ENCOUNTER — Other Ambulatory Visit: Payer: Self-pay | Admitting: *Deleted

## 2022-03-19 DIAGNOSIS — I10 Essential (primary) hypertension: Secondary | ICD-10-CM

## 2022-03-19 DIAGNOSIS — E7800 Pure hypercholesterolemia, unspecified: Secondary | ICD-10-CM

## 2022-03-19 DIAGNOSIS — E039 Hypothyroidism, unspecified: Secondary | ICD-10-CM

## 2022-03-19 MED ORDER — LOSARTAN POTASSIUM 100 MG PO TABS: 100.0000 mg | ORAL_TABLET | Freq: Every day | ORAL | 1 refills | Status: DC

## 2022-03-19 MED ORDER — LOVASTATIN 40 MG PO TABS: 40.0000 mg | ORAL_TABLET | Freq: Every day | ORAL | 1 refills | Status: DC

## 2022-03-19 NOTE — Telephone Encounter (Signed)
Patient called and stated that his medications were Denied because it was too soon. He is almost out of medication and stated that is was NOT too soon.   LR on file Failed to Transmit.  Refills sent to pharmacy.

## 2022-03-26 DIAGNOSIS — N281 Cyst of kidney, acquired: Secondary | ICD-10-CM | POA: Diagnosis not present

## 2022-03-26 DIAGNOSIS — N400 Enlarged prostate without lower urinary tract symptoms: Secondary | ICD-10-CM | POA: Diagnosis not present

## 2022-03-26 DIAGNOSIS — K8020 Calculus of gallbladder without cholecystitis without obstruction: Secondary | ICD-10-CM | POA: Diagnosis not present

## 2022-03-26 DIAGNOSIS — R310 Gross hematuria: Secondary | ICD-10-CM | POA: Diagnosis not present

## 2022-05-06 DIAGNOSIS — R310 Gross hematuria: Secondary | ICD-10-CM | POA: Diagnosis not present

## 2022-06-15 ENCOUNTER — Telehealth (INDEPENDENT_AMBULATORY_CARE_PROVIDER_SITE_OTHER): Payer: Medicare Other | Admitting: Nurse Practitioner

## 2022-06-15 ENCOUNTER — Encounter: Payer: Self-pay | Admitting: Nurse Practitioner

## 2022-06-15 DIAGNOSIS — U071 COVID-19: Secondary | ICD-10-CM

## 2022-06-15 MED ORDER — NIRMATRELVIR/RITONAVIR (PAXLOVID) TABLET (RENAL DOSING): 2.0000 | ORAL_TABLET | Freq: Two times a day (BID) | ORAL | 0 refills | Status: AC

## 2022-06-15 NOTE — Progress Notes (Signed)
   This service is provided via telemedicine  No vital signs collected/recorded due to the encounter was a telemedicine visit.   Location of patient (ex: home, work):  Home  Patient consents to a telephone visit: Yes, see telephone visit dated 10/06/2021  Location of the provider (ex: office, home):  [employer], [place] Location    Name of any referring provider:  N/A  Names of all persons participating in the telemedicine service and their role in the encounter:  S.Chrae B/CMA, Abbey Chatters, NP, and Patient   Time spent on call:  6 min with medical assistant

## 2022-06-15 NOTE — Progress Notes (Signed)
Careteam: Patient Care Team: Dylan Seller, NP as PCP - General (Geriatric Medicine) Dylan Rigger, MD as Consulting Physician (Gastroenterology) Dylan Rotunda, MD as Consulting Physician (Cardiology) Dylan Ou, MD as Referring Physician (Ophthalmology)  Advanced Directive information    Allergies  Allergen Reactions   Paregoric     Chief Complaint  Patient presents with   Acute Visit    Positive at home covid test completed on 06/15/22. Patient with cough, stuffiness, and body aches, onset day before yesterday (Sunday). Visit completed via telephone/video visit.      HPI: Patient is a 81 y.o. male via virtual visit, he is unable to connect to [employer] video.  Telephone visit.  He took a COVID test this morning and it is positive.  He is having fatigue, cough, chills since yesterday.  No shortness of breath, nonproductive cough.  He is having nasal congestion, denies chest congestion.  Has not checked temperature, but took tylenol and chills have improved.  No N/V/D Decrease appetite since yesterday. Able to drink fluids.   His wife has paxloid and he took one of her doses.   Review of Systems:  Review of Systems  Constitutional:  Positive for chills and malaise/fatigue. Negative for fever and weight loss.  HENT:  Positive for congestion. Negative for sore throat and tinnitus.   Respiratory:  Positive for cough. Negative for sputum production and shortness of breath.   Cardiovascular:  Negative for chest pain, palpitations and leg swelling.  Gastrointestinal:  Negative for abdominal pain, constipation, diarrhea and heartburn.  Genitourinary:  Negative for dysuria, frequency and urgency.  Musculoskeletal:  Negative for back pain, falls, joint pain and myalgias.  Skin: Negative.   Neurological:  Negative for dizziness and headaches.  Psychiatric/Behavioral:  Negative for depression and memory loss. The patient does not have insomnia.     Past Medical  History:  Diagnosis Date   BPH (benign prostatic hypertrophy)    CKD (chronic kidney disease)    ED (erectile dysfunction)    Essential hypertension, benign    Hypothyroidism    Right inguinal hernia    Past Surgical History:  Procedure Laterality Date   APPENDECTOMY     COLONOSCOPY  2017   Per [facility] New Patient Packet    COLONOSCOPY  09/10/2020   Dr.Magod, awaiting biopsy results. 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 His smoking use included cigarettes. He has a 21.00 pack-year smoking history. He has never used smokeless tobacco. He reports current alcohol use. He reports that he does not use drugs.  Family History  Problem Relation Age of Onset   Parkinson's disease Mother    Stroke Father    Diabetes Father    Heart disease Father    Diabetes Brother    Myasthenia gravis Brother    Diabetes Brother    High Cholesterol Brother    Neuropathy Brother     Medications: Patient's Medications  New Prescriptions   No medications on file  Previous Medications   AMLODIPINE (NORVASC) 10 MG TABLET    TAKE 1 TABLET BY MOUTH DAILY   CHOLECALCIFEROL (VITAMIN D) 25 MCG (1000 UT) TABLET    Take 2,000 Units by mouth daily.    LEVOTHYROXINE (SYNTHROID) 175 MCG TABLET    TAKE 1 TABLET BY MOUTH DAILY BEFORE BREAKFAST   LOSARTAN (COZAAR) 100 MG TABLET    Take 1 tablet (100 mg total) by mouth daily.   LOVASTATIN (MEVACOR) 40 MG TABLET    Take 1 tablet (40 mg total) by mouth at  bedtime.   OMEGA-3 FATTY ACIDS (FISH OIL PO)    Take 2,000 mg by mouth daily.   SILDENAFIL (REVATIO) 20 MG TABLET    Take 2-5 tabs PRN prior to sexual activity  Modified Medications   No medications on file  Discontinued Medications   No medications on file    Physical Exam:  There were no vitals filed for this visit. There is no height or weight on file to calculate BMI. Wt Readings from Last 3 Encounters:  01/25/22 259 lb (117.5 kg)  03/25/21 250 lb 6.4 oz (113.6 kg)  09/24/20 256 lb (116.1 kg)    Physical Exam  Labs reviewed: Basic Metabolic Panel: Recent Labs    01/25/22 1153 03/01/22 1126  NA 141 139  K 4.2 4.0  CL 105 105  CO2 29 26  GLUCOSE 122* 117  BUN 24 24  CREATININE 1.65* 1.48*  CALCIUM 9.2 9.1  TSH 2.45  --    Liver Function Tests: Recent Labs    01/25/22 1153  AST 26  ALT 30  BILITOT 1.0  PROT 6.9   No results for input(s): "LIPASE", "AMYLASE" in the last 8760 hours. No results for input(s): "AMMONIA" in the last 8760 hours. CBC: Recent Labs    01/25/22 1153 03/01/22 1126  WBC 7.2 6.7  NEUTROABS 3,758 3,424  HGB 15.2 14.6  HCT 44.5 42.9  MCV 90.3 89.4  PLT 264 260   Lipid Panel: Recent Labs    01/25/22 1153  CHOL 97  HDL 41  LDLCALC 40  TRIG 78  CHOLHDL 2.4   TSH: Recent Labs    01/25/22 1153  TSH 2.45   A1C: Lab Results  Component Value Date   HGBA1C 7.2 (H) 01/25/2022     Assessment/Plan 1. COVID-19 Netipot or saline wash daily Plain nasal saline spray throughout the day as needed May use tylenol 325 mg 2 tablets every 6-8 hours as needed aches and pains or sore throat humidifier in the home to help with the dry air Mucinex DM by mouth twice daily as needed for cough and chest congestion with full glass of water  Keep well hydrated Proper nutrition  Avoid forcefully blowing nose Vit c 1000 mg daily Vit d 2000 units daily  -Your test for COVID-19 was positive, meaning that you were infected with the  coronavirus and could give the germ to others.  Please continue isolation at home for at least 5 days since the start of your symptoms. On day 5, as long as your symptoms are improving and you are not having a fever, you can return to normal activities, ensuring you are wearing a mask at all times when not at home.   Once you complete your 10 days you are good to resume normal acitivities.   -advise against taking medications prescribed for others due to adverse reactions with other medication and co-morbidities, he has been advised to stop lovastatin for 10 days  since he started paxlovid - nirmatrelvir/ritonavir EUA, renal dosing, (PAXLOVID) 10 x 150 MG & 10 x 100MG  TABS; Take 2 tablets by mouth 2 (two) times daily for 5 days. (Take nirmatrelvir 150 mg one tablet twice daily for 5 days and ritonavir 100 mg one tablet twice daily for 5 days) Patient GFR is 42  Dispense: 20 tablet; Refill: 0 -strick follow up precautions given.   Next appt: 08/02/2022  [name]. Harle Battiest  [facility] [phone]    Virtual Visit via telephone  I connected with patient on 06/15/22 at 10:00 AM EDT by telephoen and verified that I am speaking with the correct person using two identifiers.  Location: Patient: home Provider: [facility]    I discussed the limitations, risks, security and privacy concerns of performing an evaluation and management service by telephone and the availability of in person appointments. I also discussed with the patient that there may be a patient responsible charge related to this service. The patient expressed understanding and agreed to proceed.   I discussed the assessment and treatment plan with the patient. The patient was provided an opportunity to ask questions and all were answered. The patient agreed with the plan and demonstrated an understanding of the instructions.   The patient was advised to call back or seek an in-person evaluation if the  symptoms worsen or if the condition fails to improve as anticipated.  I provided 15 minutes of non-face-to-face time during this encounter.  [name]. Harle Battiest Avs printed and mailed

## 2022-07-14 DIAGNOSIS — L821 Other seborrheic keratosis: Secondary | ICD-10-CM | POA: Diagnosis not present

## 2022-07-14 DIAGNOSIS — L245 Irritant contact dermatitis due to other chemical products: Secondary | ICD-10-CM | POA: Diagnosis not present

## 2022-07-14 DIAGNOSIS — L570 Actinic keratosis: Secondary | ICD-10-CM | POA: Diagnosis not present

## 2022-07-26 ENCOUNTER — Ambulatory Visit: Payer: Medicare Other | Admitting: Nurse Practitioner

## 2022-08-02 ENCOUNTER — Encounter: Payer: Self-pay | Admitting: Nurse Practitioner

## 2022-08-02 ENCOUNTER — Ambulatory Visit (INDEPENDENT_AMBULATORY_CARE_PROVIDER_SITE_OTHER): Payer: Medicare Other | Admitting: Nurse Practitioner

## 2022-08-02 VITALS — BP 128/86 | HR 68 | Wt 239.6 lb

## 2022-08-02 DIAGNOSIS — R739 Hyperglycemia, unspecified: Secondary | ICD-10-CM

## 2022-08-02 DIAGNOSIS — I10 Essential (primary) hypertension: Secondary | ICD-10-CM

## 2022-08-02 DIAGNOSIS — Z6832 Body mass index (BMI) 32.0-32.9, adult: Secondary | ICD-10-CM

## 2022-08-02 DIAGNOSIS — E039 Hypothyroidism, unspecified: Secondary | ICD-10-CM | POA: Diagnosis not present

## 2022-08-02 DIAGNOSIS — E6609 Other obesity due to excess calories: Secondary | ICD-10-CM

## 2022-08-02 DIAGNOSIS — Z23 Encounter for immunization: Secondary | ICD-10-CM | POA: Diagnosis not present

## 2022-08-02 DIAGNOSIS — N401 Enlarged prostate with lower urinary tract symptoms: Secondary | ICD-10-CM

## 2022-08-02 DIAGNOSIS — R350 Frequency of micturition: Secondary | ICD-10-CM

## 2022-08-02 NOTE — Progress Notes (Signed)
Careteam: Patient Care Team: Lauree Chandler, NP as PCP - General (Geriatric Medicine) Clarene Essex, MD as Consulting Physician (Gastroenterology) Minus Breeding, MD as Consulting Physician (Cardiology) Emeline Gins, MD as Referring Physician (Ophthalmology)  PLACE OF SERVICE:  [place] Directive information    Allergies  Allergen Reactions   Paregoric     Chief Complaint  Patient presents with   Medical Management of Chronic Issues    Patient presents today for a 6 month follow-up   Quality Metric Gaps    Zoster,TDAP,COVID#5     HPI: Patient is a 81 y.o. male for follow up.   Saw urologist due to BPH - he is taking flomax that is helping with his frequency at bedtime.   Saw dermatologist due to rash- thought it was due to chiggers but he did not feel like that was the cause, gave him a cream.   Prediabetic- he has cut back on carbs, sweets and increased physical activity.  He has been checking blood sugars weekly- this morning 124, 114.  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 No pain.   Doing a lot of yard work and staying active.    Review of Systems:  Review of Systems  Constitutional:  Negative for chills, fever and weight loss.  HENT:  Negative for tinnitus.   Respiratory:  Negative for cough, sputum production and shortness of breath.   Cardiovascular:  Negative for chest pain, palpitations and leg swelling.  Gastrointestinal:  Negative for abdominal pain, constipation, diarrhea and heartburn.  Genitourinary:  Negative for dysuria, frequency and urgency.  Musculoskeletal:  Negative for back pain, falls, joint pain and myalgias.  Skin: Negative.   Neurological:  Negative for dizziness and headaches.  Psychiatric/Behavioral:  Negative for depression and memory loss. The patient does not have insomnia.     Past Medical History:  Diagnosis Date   BPH (benign prostatic hypertrophy)    CKD (chronic kidney disease)    ED  (erectile dysfunction)    Essential hypertension, benign    Hypothyroidism    Right inguinal hernia    Past Surgical History:  Procedure Laterality Date   APPENDECTOMY     COLONOSCOPY  2017   Per [place] New Patient Packet    COLONOSCOPY  09/10/2020   Dr.Magod, awaiting biopsy results. 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 His smoking use included cigarettes. He has a 21.00 pack-year smoking history. He has never used smokeless tobacco. He reports current alcohol use. He reports that he does not use drugs.  Family History  Problem Relation Age of Onset   Parkinson's disease Mother    Stroke Father    Diabetes Father    Heart disease Father    Diabetes Brother    Myasthenia gravis Brother    Diabetes Brother    High Cholesterol Brother    Neuropathy Brother     Medications: Patient's Medications  New Prescriptions   No medications  on file  Previous Medications   AMLODIPINE (NORVASC) 10 MG TABLET    TAKE 1 TABLET BY MOUTH DAILY   CHOLECALCIFEROL (VITAMIN D) 25 MCG (1000 UT) TABLET    Take 2,000 Units by mouth daily.    LEVOTHYROXINE (SYNTHROID) 175 MCG TABLET    TAKE 1 TABLET BY MOUTH DAILY BEFORE BREAKFAST   LOSARTAN (COZAAR) 100 MG TABLET    Take 1 tablet (100 mg total) by mouth daily.   LOVASTATIN (MEVACOR) 40 MG TABLET    Take 1 tablet (40 mg total) by mouth at bedtime.   OMEGA-3 FATTY ACIDS (FISH OIL PO)    Take 2,000 mg by mouth daily.   SILDENAFIL (REVATIO) 20 MG  TABLET    Take 2-5 tabs PRN prior to sexual activity   TAMSULOSIN (FLOMAX) 0.4 MG CAPS CAPSULE    Take 0.4 mg by mouth daily.  Modified Medications   No medications on file  Discontinued Medications   No medications on file    Physical Exam:  Vitals:   08/02/22 1013  BP: 128/86  Pulse: 68  SpO2: 97%  Weight: 239 lb 9.6 oz (108.7 kg)   Body mass index is 32.5 kg/m. Wt Readings from Last 3 Encounters:  08/02/22 239 lb 9.6 oz (108.7 kg)  01/25/22 259 lb (117.5 kg)  03/25/21 250 lb 6.4 oz (113.6 kg)    Physical Exam Constitutional:      General: He is not in acute distress.    Appearance: He is well-developed. He is not diaphoretic.  HENT:     Head: Normocephalic and atraumatic.     Right Ear: External ear normal.     Left Ear: External ear normal.     Mouth/Throat:     Pharynx: No oropharyngeal exudate.  Eyes:     Conjunctiva/sclera: Conjunctivae normal.     Pupils: Pupils are equal, round, and reactive to light.  Cardiovascular:     Rate and Rhythm: Normal rate and regular rhythm.     Heart sounds: Normal heart sounds.  Pulmonary:     Effort: Pulmonary effort is normal.     Breath sounds: Normal breath sounds.  Abdominal:     General: Bowel sounds are normal.     Palpations: Abdomen is soft.  Musculoskeletal:        General: No tenderness.     Cervical back: Normal range of motion and neck supple.     Right lower leg: No edema.     Left lower leg: No edema.  Skin:    General: Skin is warm and dry.  Neurological:     Mental Status: He is alert and oriented to person, place, and time.  Psychiatric:        Mood and Affect: Mood normal.     Labs reviewed: Basic Metabolic Panel: Recent Labs    01/25/22 1153 03/01/22 1126  NA 141 139  K 4.2 4.0  CL 105 105  CO2 29 26  GLUCOSE 122* 117  BUN 24 24  CREATININE 1.65* 1.48*  CALCIUM 9.2 9.1  TSH 2.45  --    Liver Function Tests: Recent Labs    01/25/22 1153  AST 26  ALT 30  BILITOT 1.0  PROT 6.9    No results for input(s): "LIPASE", "AMYLASE" in the last 8760 hours. No results for input(s): "AMMONIA" in the last 8760 hours. CBC: Recent Labs    01/25/22 1153 03/01/22 1126  WBC 7.2 6.7  NEUTROABS 3,758 3,424  HGB 15.2 14.6  HCT 44.5 42.9  MCV 90.3 89.4  PLT 264 260   Lipid Panel: Recent Labs    01/25/22 1153  CHOL 97  HDL 41  LDLCALC 40  TRIG 78  CHOLHDL 2.4   TSH: Recent Labs    01/25/22 1153  TSH 2.45   A1C: Lab Results  Component Value Date   HGBA1C 7.2 (H) 01/25/2022     Assessment/Plan 1. Need for influenza vaccination - Flu Vaccine QUAD High Dose(Fluad)  2. Hyperglycemia -has worked on reducing sugar, carbohydrates and increasing physical activity - Hemoglobin A1c  3. Essential hypertension -Blood pressure well controlled, goal bp <140/90 Continue current medications and dietary modifications follow metabolic panel - CMP with eGFR([facility])  4. Hypothyroidism, unspecified type -TSH at goal on last labs, continues synthroid 175 mcg  5. Benign prostatic hyperplasia with urinary frequency -stable on flomax 0.4 mg daily  6. Class 1 obesity due to excess calories with serious comorbidity and body mass index (BMI) of 32.0 to 32.9 in adult --education provided on healthy weight loss through increase in physical activity and proper nutrition   Return in about 6 months (around 02/01/2023) for routine follow up . [name]. [place], [facility] [phone]

## 2022-08-02 NOTE — Patient Instructions (Signed)
Okay to add miralax 17 gm daily as needed for constipation

## 2022-08-03 LAB — COMPLETE METABOLIC PANEL WITH GFR
AG Ratio: 1.5 (calc) (ref 1.0–2.5)
ALT: 16 U/L (ref 9–46)
AST: 21 U/L (ref 10–35)
Albumin: 4.3 g/dL (ref 3.6–5.1)
Alkaline phosphatase (APISO): 65 U/L (ref 35–144)
BUN/Creatinine Ratio: 16 (calc) (ref 6–22)
BUN: 28 mg/dL — ABNORMAL HIGH (ref 7–25)
CO2: 27 mmol/L (ref 20–32)
Calcium: 9.4 mg/dL (ref 8.6–10.3)
Chloride: 104 mmol/L (ref 98–110)
Creat: 1.75 mg/dL — ABNORMAL HIGH (ref 0.70–1.28)
Globulin: 2.8 g/dL (calc) (ref 1.9–3.7)
Glucose, Bld: 111 mg/dL — ABNORMAL HIGH (ref 65–99)
Potassium: 4.1 mmol/L (ref 3.5–5.3)
Sodium: 138 mmol/L (ref 135–146)
Total Bilirubin: 0.9 mg/dL (ref 0.2–1.2)
Total Protein: 7.1 g/dL (ref 6.1–8.1)
eGFR: 39 mL/min/{1.73_m2} — ABNORMAL LOW (ref >=60)

## 2022-08-03 LAB — HEMOGLOBIN A1C
Hgb A1c MFr Bld: 6.1 % of total Hgb — ABNORMAL HIGH (ref <5.7)
Mean Plasma Glucose: 128 mg/dL
eAG (mmol/L): 7.1 mmol/L

## 2022-08-20 ENCOUNTER — Other Ambulatory Visit: Payer: Self-pay | Admitting: Nurse Practitioner

## 2022-08-20 DIAGNOSIS — E7800 Pure hypercholesterolemia, unspecified: Secondary | ICD-10-CM

## 2022-09-14 ENCOUNTER — Other Ambulatory Visit: Payer: Self-pay | Admitting: Nurse Practitioner

## 2022-09-14 DIAGNOSIS — I10 Essential (primary) hypertension: Secondary | ICD-10-CM

## 2022-09-20 ENCOUNTER — Other Ambulatory Visit: Payer: Self-pay | Admitting: Nurse Practitioner

## 2022-09-20 ENCOUNTER — Other Ambulatory Visit: Payer: Self-pay

## 2022-09-20 DIAGNOSIS — I10 Essential (primary) hypertension: Secondary | ICD-10-CM

## 2022-09-20 MED ORDER — AMLODIPINE BESYLATE 10 MG PO TABS: 10.0000 mg | ORAL_TABLET | Freq: Every day | ORAL | 1 refills | Status: DC

## 2022-09-20 MED ORDER — LOSARTAN POTASSIUM 100 MG PO TABS: 100.0000 mg | ORAL_TABLET | Freq: Every day | ORAL | 1 refills | Status: DC

## 2022-09-20 NOTE — Telephone Encounter (Signed)
Patient received a notification from [facility] that we denied refills for losartan and amlodipine.  I informed patient that we did not deny, rx's were approved on 09/15/22 and we have a receipt confirmation from pharmacy that they were received at 8:37 am. Patient asked if I could re-submit.  RX's re-submitted as requested

## 2022-10-12 ENCOUNTER — Telehealth: Payer: Self-pay

## 2022-10-12 ENCOUNTER — Ambulatory Visit (INDEPENDENT_AMBULATORY_CARE_PROVIDER_SITE_OTHER): Payer: Medicare Other | Admitting: Nurse Practitioner

## 2022-10-12 ENCOUNTER — Encounter: Payer: Self-pay | Admitting: Nurse Practitioner

## 2022-10-12 DIAGNOSIS — Z0000 Encounter for general adult medical examination without abnormal findings: Secondary | ICD-10-CM

## 2022-10-12 NOTE — Progress Notes (Signed)
Subjective:   [name] is a 81 y.o. male who presents for Medicare Annual/Subsequent preventive examination.  Review of Systems     Cardiac Risk Factors include: advanced age (>57men, >71 women);dyslipidemia;sedentary lifestyle;hypertension;male gender;obesity (BMI >30kg/m2)     Objective:    There were no vitals filed for this visit. There is no height or weight on file to calculate BMI.     10/12/2022   11:21 AM 01/25/2022   10:41 AM 10/06/2021    9:41 AM 03/25/2021   11:03 AM 10/03/2020   11:32 AM 09/24/2020   10:43 AM 03/14/2020    1:31 PM  Advanced Directives  Does Patient Have a Medical Advance Directive? No No No Yes No No No  Does patient want to make changes to medical advance directive?    Yes (MAU/Ambulatory/Procedural Areas - Information given) Yes (MAU/Ambulatory/Procedural Areas - Information given) Yes (MAU/Ambulatory/Procedural Areas - Information given)   Would patient like information on creating a medical advance directive?  Yes (MAU/Ambulatory/Procedural Areas - Information given)     No - Patient declined    Current Medications (verified) Outpatient Encounter Medications as of 10/12/2022  Medication Sig   amLODipine (NORVASC) 10 MG tablet Take 1 tablet (10 mg total) by mouth daily.   cholecalciferol (VITAMIN D) 25 MCG (1000 UT) tablet Take 2,000 Units by mouth daily.    levothyroxine (SYNTHROID) 175 MCG tablet TAKE 1 TABLET BY MOUTH DAILY BEFORE BREAKFAST   losartan (COZAAR) 100 MG tablet Take 1 tablet (100 mg total) by mouth daily.   lovastatin (MEVACOR) 40 MG tablet TAKE 1 TABLET BY MOUTH AT BEDTIME   Omega-3 Fatty Acids (FISH OIL PO) Take 2,000 mg by mouth daily.   tamsulosin (FLOMAX) 0.4 MG CAPS capsule Take 0.4 mg by mouth daily.   [DISCONTINUED] sildenafil (REVATIO) 20 MG tablet Take 2-5 tabs PRN prior to sexual activity   No facility-administered encounter medications on file as of 10/12/2022.    Allergies (verified) Paregoric    History: Past Medical History:  Diagnosis Date   BPH (benign prostatic hypertrophy)    CKD (chronic kidney disease)    ED (erectile dysfunction)    Essential hypertension, benign    Hypothyroidism    Right inguinal hernia    Past Surgical History:  Procedure Laterality Date   APPENDECTOMY     COLONOSCOPY  2017   Per [facility] New Patient Packet    COLONOSCOPY  09/10/2020   Dr.Magod, awaiting biopsy results. 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   Procedure: APPLICATION DIGIT WIDGET LET MIDDLE FINGER;  Surgeon: Cindee Salt, MD;  Location: [facility];  Service: Orthopedics;  Laterality: Left;   HERNIA REPAIR     abd   REPAIR EXTENSOR TENDON Left 09/07/2016   Procedure: Reconstruction PULLEY sheath left middle finger Extensor retinaculum graft;  Surgeon: Cindee Salt, MD;  Location: [facility];  Service: Orthopedics;  Laterality: Left;   Family History  Problem Relation Age of Onset   Parkinson's disease Mother    Stroke Father    Diabetes Father    Heart disease Father    Diabetes Brother    Myasthenia gravis Brother    Diabetes Brother    High Cholesterol Brother    Neuropathy Brother    Social History   Socioeconomic History   Marital status: Married    Spouse name: Not on file   Number of children: 3   Years of education: Not on file  Highest education level: Not on file  Occupational History   Occupation: Textiles  Tobacco Use   Smoking status: Former    Packs/day: 0.70    Years: 30.00    Total pack years: 21.00    Types: Cigarettes    Quit date: 10/25/1992    Years since quitting: 29.9   Smokeless tobacco: Never  Vaping Use   Vaping Use: Never used  Substance and Sexual Activity   Alcohol use: Yes    Comment: Rare    Drug use: No   Sexual activity: Not on file  Other Topics Concern   Not on file  Social History Narrative   Lives with wife.        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 No   Do you have difficulty preparing food or eating? No   Do you have difficulty managing your medications? No   Do you have difficulty managing your finances? No   Do you have difficulty affording your medications? No   Social Determinants of Corporate investment banker Strain: Not on file  Food Insecurity: Not on file  Transportation Needs: Not on file  Physical Activity: Not on file  Stress: Not on file  Social Connections: Not on file    Tobacco Counseling Counseling given: Not Answered   Clinical Intake:  Pre-visit preparation completed: Yes  Pain : No/denies pain     BMI - recorded: 32 Nutritional Status: BMI > 30  Obese Nutritional Risks: None Diabetes: No  How often do you need to have someone help you when you read instructions, pamphlets, or other written materials from your doctor or pharmacy?: 1 - Never  Diabetic?no         Activities of Daily Living    10/12/2022   11:32 AM  In your present state of health, do you have any difficulty performing the following activities:  Hearing? 0  Vision? 0  Difficulty concentrating or making decisions? 0  Walking or climbing stairs? 0  Dressing or bathing? 0  Doing errands, shopping? 0  Preparing Food and eating ? N  Using the Toilet? N  In the past six months, have you accidently leaked urine? N  Do you have problems with loss of bowel control? N  Managing your Medications? N  Managing your Finances? N   Housekeeping or managing your Housekeeping? N    Patient Care Team: [name], NP as PCP - General (Geriatric Medicine) Vida Rigger, MD as Consulting Physician (Gastroenterology) Rollene Rotunda, MD as Consulting Physician (Cardiology) Johnney Ou, MD as Referring Physician (Ophthalmology)  Indicate any recent Medical Services you may have received from other than [facility] providers in the past year (date may be approximate).     Assessment:   This is a  routine wellness examination for [name].  Hearing/Vision screen Hearing Screening - Comments:: Patient has no hearing problems Vision Screening - Comments:: Patient has no vision problems. Patient has had eye exam within past year. Patient sees Dr. Rush Landmark ([place], [place])  Dietary issues and exercise activities discussed: Current Exercise Habits: Home exercise routine, Type of exercise: walking, Time (Minutes): 30, Frequency (Times/Week): 5, Weekly Exercise (Minutes/Week): 150   Goals Addressed   None    Depression Screen    10/12/2022   11:18 AM 01/25/2022   10:40 AM 10/06/2021    9:38 AM 10/03/2020   11:31 AM 09/24/2020   10:42 AM 09/14/2019    1:29 PM 07/04/2019   10:48 AM  PHQ 2/9 Scores  PHQ - 2 Score 0 0 0 0 0 0 0    Fall Risk    10/12/2022   11:20 AM 08/02/2022   10:09 AM 03/01/2022   11:07 AM 01/25/2022   10:40 AM 10/06/2021    9:39 AM  Fall Risk   Falls in the past year? 1 1 0 0 0  Number falls in past yr: 1 0 0 0 0  Injury with Fall? 0 1 0 0 0  Risk for fall due to : History of fall(s) History of fall(s) No Fall Risks No Fall Risks No Fall Risks  Follow up Falls evaluation completed  Falls evaluation completed Falls evaluation completed Falls evaluation completed    FALL RISK PREVENTION PERTAINING TO THE HOME:  Any stairs in or around the home? Yes  If so, are there any without handrails? No  Home free of loose throw rugs in walkways, pet beds, electrical cords, etc? Yes  Adequate  lighting in your home to reduce risk of falls? Yes   ASSISTIVE DEVICES UTILIZED TO PREVENT FALLS:  Life alert? No  Use of a cane, walker or w/c? Yes  Grab bars in the bathroom? Yes  Shower chair or bench in shower? No  Elevated toilet seat or a handicapped toilet? Yes   TIMED UP AND GO:  Was the test performed? No .    Cognitive Function:        10/12/2022   11:21 AM 10/06/2021    9:41 AM 10/03/2020   11:34 AM 10/02/2019   10:04 AM  6CIT Screen  What Year? 0 points 0 points 0 points 0 points  What month? 0 points 0 points 0 points 0 points  What time? 0 points 0 points 0 points 0 points  Count back from 20 0 points 0 points 0 points 0 points  Months in reverse 0 points 0 points 0 points 0 points  Repeat phrase 0 points 0 points 0 points 0 points  Total Score 0 points 0 points 0 points 0 points    Immunizations Immunization History  Administered Date(s) Administered   Fluad Quad(high Dose 65+) 07/04/2019, 09/24/2020, 08/02/2022   Influenza Whole 10/02/2012   Influenza, High Dose Seasonal PF 08/16/2016, 08/12/2017, 08/30/2018, 10/05/2021   Influenza,inj,Quad PF,6+ Mos 08/06/2013, 08/30/2014, 08/15/2015   Moderna Sars-Covid-2 Vaccination 11/14/2019, 12/17/2019, 09/02/2020, 04/08/2021   Pneumococcal Conjugate-13 08/06/2013   Pneumococcal Polysaccharide-23 11/25/2008   Tdap 07/26/2011   Zoster Recombinat (Shingrix) 05/23/2020   Zoster, Live 01/24/2008    TDAP status: Due, Education has been provided regarding the importance of this vaccine. Advised may receive this vaccine at local pharmacy or Health Dept. Aware to provide a copy of the vaccination record if obtained from local pharmacy or Health Dept. Verbalized acceptance and understanding.  Flu  Vaccine status: Up to date  Pneumococcal vaccine status: Up to date  Covid-19 vaccine status: Information provided on how to obtain vaccines.   Qualifies for Shingles Vaccine? Yes   Zostavax completed No   Shingrix  Completed?: No.    Education has been provided regarding the importance of this vaccine. Patient has been advised to call insurance company to determine out of pocket expense if they have not yet received this vaccine. Advised may also receive vaccine at local pharmacy or Health Dept. Verbalized acceptance and understanding.  Screening Tests Health Maintenance  Topic Date Due   Zoster Vaccines- Shingrix (2 of 2) 07/18/2020   DTaP/Tdap/Td (2 - Td or Tdap) 07/25/2021   COVID-19 Vaccine (5 - 2023-24 season) 06/25/2022   Medicare Annual Wellness (AWV)  10/13/2023   Pneumonia Vaccine 5265+ Years old  Completed   INFLUENZA VACCINE  Completed   Hepatitis C Screening  Completed   HPV VACCINES  Aged Out   COLONOSCOPY (Pts 45-5867yrs Insurance coverage will need to be confirmed)  Discontinued    Health Maintenance  Health Maintenance Due  Topic Date Due   Zoster Vaccines- Shingrix (2 of 2) 07/18/2020   DTaP/Tdap/Td (2 - Td or Tdap) 07/25/2021   COVID-19 Vaccine (5 - 2023-24 season) 06/25/2022    Colorectal cancer screening: No longer required.   Lung Cancer Screening: (Low Dose CT Chest recommended if Age 78-80 years, 30 pack-year currently smoking OR have quit w/in 15years.) does not qualify.   Lung Cancer Screening Referral: na  Additional Screening:  Hepatitis C Screening: does qualify; Completed 2021  Vision Screening: Recommended annual ophthalmology exams for early detection of glaucoma and other disorders of the eye. Is the patient up to date with their annual eye exam?  Yes  Who is the provider or what is the name of the office in which the patient attends annual eye exams? [facility]  If pt is not established with a provider, would they like to be referred to a provider to establish care? No .   Dental Screening: Recommended annual dental exams for proper oral hygiene  Community Resource Referral / Chronic Care Management: CRR required this visit?  No   CCM required this visit?   No      Plan:     I have personally reviewed and noted the following in the patient's chart:   Medical and social history Use of alcohol, tobacco or illicit drugs  Current medications and supplements including opioid prescriptions. Patient is not currently taking opioid prescriptions. Functional ability and status Nutritional status Physical activity Advanced directives List of other physicians Hospitalizations, surgeries, and ER visits in previous 12 months Vitals Screenings to include cognitive, depression, and falls Referrals and appointments  In addition, I have reviewed and discussed with patient certain preventive protocols, quality metrics, and best practice recommendations. A written personalized care plan for preventive services as well as general preventive health recommendations were provided to patient.     Sharon SellerJessica K Kamil Mchaffie, NP   10/12/2022    Virtual Visit via Telephone Note  I connected with patient 10/12/22 at 11:00 AM EST by telephone and verified that I am speaking with the correct person using two identifiers.  Location: Patient: home Provider: [facility]   I discussed the limitations, risks, security and privacy concerns of performing an evaluation and management service by telephone and the availability of in person appointments. I also discussed with the patient that there may be a patient responsible charge related to this service.  The patient expressed understanding and agreed to proceed.   I discussed the assessment and treatment plan with the patient. The patient was provided an opportunity to ask questions and all were answered. The patient agreed with the plan and demonstrated an understanding of the instructions.   The patient was advised to call back or seek an in-person evaluation if the symptoms worsen or if the condition fails to improve as anticipated.  I provided 15 minutes of non-face-to-face time during this encounter.  Janene Harvey.  [employer] printed and mailed

## 2022-10-12 NOTE — Patient Instructions (Signed)
[name] , Thank you for taking time to come for your Medicare Wellness Visit. I appreciate your ongoing commitment to your health goals. Please review the following plan we discussed and let me know if I can assist you in the future.   Screening recommendations/referrals: Colonoscopy aged out Recommended yearly ophthalmology/optometry visit for glaucoma screening and checkup Recommended yearly dental visit for hygiene and checkup  Vaccinations: Influenza vaccine due annually in September/October Pneumococcal vaccine up to date Tdap vaccine -DUE- recommend to get at your local pharmacy Shingles vaccine -to call pharmacy and have them send [place] your record    Advanced directives: recommended to complete  Conditions/risks identified:   Next appointment: yearly  Preventive Care 65 Years and Older, Male Preventive care refers to lifestyle choices and visits with your health care provider that can promote health and wellness. What does preventive care include? A yearly physical exam. This is also called an annual well check. Dental exams once or twice a year. Routine eye exams. Ask your health care provider how often you should have your eyes checked. Personal lifestyle choices, including: Daily care of your teeth and gums. Regular physical activity. Eating a healthy diet. Avoiding tobacco and drug use. Limiting alcohol use. Practicing safe sex. Taking low doses of aspirin every day. Taking vitamin and mineral supplements as recommended by your health care provider. What happens during an annual well check? The services and screenings done by your health care provider during your annual well check will depend on your age, overall health, lifestyle risk factors, and family history of disease. Counseling  Your health care provider may ask you questions about your: Alcohol use. Tobacco use. Drug use. Emotional well-being. Home and relationship well-being. Sexual activity. Eating  habits. History of falls. Memory and ability to understand (cognition). Work and work Astronomer. Screening  You may have the following tests or measurements: Height, weight, and BMI. Blood pressure. Lipid and cholesterol levels. These may be checked every 5 years, or more frequently if you are over 61 years old. Skin check. Lung cancer screening. You may have this screening every year starting at age 10 if you have a 30-pack-year history of smoking and currently smoke or have quit within the past 15 years. Fecal occult blood test (FOBT) of the stool. You may have this test every year starting at age 51. Flexible sigmoidoscopy or colonoscopy. You may have a sigmoidoscopy every 5 years or a colonoscopy every 10 years starting at age 82. Prostate cancer screening. Recommendations will vary depending on your family history and other risks. Hepatitis C blood test. Hepatitis B blood test. Sexually transmitted disease (STD) testing. Diabetes screening. This is done by checking your blood sugar (glucose) after you have not eaten for a while (fasting). You may have this done every 1-3 years. Abdominal aortic aneurysm (AAA) screening. You may need this if you are a current or former smoker. Osteoporosis. You may be screened starting at age 59 if you are at high risk. Talk with your health care provider about your test results, treatment options, and if necessary, the need for more tests. Vaccines  Your health care provider may recommend certain vaccines, such as: Influenza vaccine. This is recommended every year. Tetanus, diphtheria, and acellular pertussis (Tdap, Td) vaccine. You may need a Td booster every 10 years. Zoster vaccine. You may need this after age 39. Pneumococcal 13-valent conjugate (PCV13) vaccine. One dose is recommended after age 85. Pneumococcal polysaccharide (PPSV23) vaccine. One dose is recommended after age 38.  Talk to your health care provider about which screenings and  vaccines you need and how often you need them. This information is not intended to replace advice given to you by your health care provider. Make sure you discuss any questions you have with your health care provider. Document Released: 11/07/2015 Document Revised: 06/30/2016 Document Reviewed: 08/12/2015 Elsevier Interactive Patient Education  2017 [place] Prevention in the Home Falls can cause injuries. They can happen to people of all ages. There are many things you can do to make your home safe and to help prevent falls. What can I do on the outside of my home? Regularly fix the edges of walkways and driveways and fix any cracks. Remove anything that might make you trip as you walk through a door, such as a raised step or threshold. Trim any bushes or trees on the path to your home. Use bright outdoor lighting. Clear any walking paths of anything that might make someone trip, such as rocks or tools. Regularly check to see if handrails are loose or broken. Make sure that both sides of any steps have handrails. Any raised decks and porches should have guardrails on the edges. Have any leaves, snow, or ice cleared regularly. Use sand or salt on walking paths during winter. Clean up any spills in your garage right away. This includes oil or grease spills. What can I do in the bathroom? Use night lights. Install grab bars by the toilet and in the tub and shower. Do not use towel bars as grab bars. Use non-skid mats or decals in the tub or shower. If you need to sit down in the shower, use a plastic, non-slip stool. Keep the floor dry. Clean up any water that spills on the floor as soon as it happens. Remove soap buildup in the tub or shower regularly. Attach bath mats securely with double-sided non-slip rug tape. Do not have throw rugs and other things on the floor that can make you trip. What can I do in the bedroom? Use night lights. Make sure that you have a light by your  bed that is easy to reach. Do not use any sheets or blankets that are too big for your bed. They should not hang down onto the floor. Have a firm chair that has side arms. You can use this for support while you get dressed. Do not have throw rugs and other things on the floor that can make you trip. What can I do in the kitchen? Clean up any spills right away. Avoid walking on wet floors. Keep items that you use a lot in easy-to-reach places. If you need to reach something above you, use a strong step stool that has a grab bar. Keep electrical cords out of the way. Do not use floor polish or wax that makes floors slippery. If you must use wax, use non-skid floor wax. Do not have throw rugs and other things on the floor that can make you trip. What can I do with my stairs? Do not leave any items on the stairs. Make sure that there are handrails on both sides of the stairs and use them. Fix handrails that are broken or loose. Make sure that handrails are as long as the stairways. Check any carpeting to make sure that it is firmly attached to the stairs. Fix any carpet that is loose or worn. Avoid having throw rugs at the top or bottom of the stairs. If you do have throw  rugs, attach them to the floor with carpet tape. Make sure that you have a light switch at the top of the stairs and the bottom of the stairs. If you do not have them, ask someone to add them for you. What else can I do to help prevent falls? Wear shoes that: Do not have high heels. Have rubber bottoms. Are comfortable and fit you well. Are closed at the toe. Do not wear sandals. If you use a stepladder: Make sure that it is fully opened. Do not climb a closed stepladder. Make sure that both sides of the stepladder are locked into place. Ask someone to hold it for you, if possible. Clearly mark and make sure that you can see: Any grab bars or handrails. First and last steps. Where the edge of each step is. Use tools that  help you move around (mobility aids) if they are needed. These include: Canes. Walkers. Scooters. Crutches. Turn on the lights when you go into a dark area. Replace any light bulbs as soon as they burn out. Set up your furniture so you have a clear path. Avoid moving your furniture around. If any of your floors are uneven, fix them. If there are any pets around you, be aware of where they are. Review your medicines with your doctor. Some medicines can make you feel dizzy. This can increase your chance of falling. Ask your doctor what other things that you can do to help prevent falls. This information is not intended to replace advice given to you by your health care provider. Make sure you discuss any questions you have with your health care provider. Document Released: 08/07/2009 Document Revised: 03/18/2016 Document Reviewed: 11/15/2014 Elsevier Interactive Patient Education  2017 [employer].

## 2022-10-12 NOTE — Progress Notes (Signed)
This service is provided via telemedicine  No vital signs collected/recorded due to the encounter was a telemedicine visit.   Location of patient (ex: home, work):  Home  Patient consents to a telephone visit:  Yes, see encounter dated 10/12/2022  Location of the provider (ex: office, home):  [facility]  Name of any referring provider:  N/A  Names of all persons participating in the telemedicine service and their role in the encounter:  Abbey Chatters, Nurse Practitioner, Elveria Royals, CMA, and patient.   Time spent on call:  9 minutes with medical assistant

## 2022-10-12 NOTE — Telephone Encounter (Signed)
[name], czaja are scheduled for a virtual visit with your provider today.    Just as we do with appointments in the office, we must obtain your consent to participate.  Your consent will be active for this visit and any virtual visit you may have with one of our providers in the next 365 days.    If you have a MyChart account, I can also send a copy of this consent to you electronically.  All virtual visits are billed to your insurance company just like a traditional visit in the office.  As this is a virtual visit, video technology does not allow for your provider to perform a traditional examination.  This may limit your provider's ability to fully assess your condition.  If your provider identifies any concerns that need to be evaluated in person or the need to arrange testing such as labs, EKG, etc, we will make arrangements to do so.    Although advances in technology are sophisticated, we cannot ensure that it will always work on either your end or our end.  If the connection with a video visit is poor, we may have to switch to a telephone visit.  With either a video or telephone visit, we are not always able to ensure that we have a secure connection.   I need to obtain your verbal consent now.   Are you willing to proceed with your visit today?   [name] has provided verbal consent on 10/12/2022 for a virtual visit (video or telephone).   Elveria Royals, CMA 10/12/2022  11:25 AM

## 2022-11-16 ENCOUNTER — Other Ambulatory Visit: Payer: Self-pay | Admitting: *Deleted

## 2022-11-16 DIAGNOSIS — E7800 Pure hypercholesterolemia, unspecified: Secondary | ICD-10-CM

## 2022-11-16 DIAGNOSIS — K080 Exfoliation of teeth due to systemic causes: Secondary | ICD-10-CM | POA: Diagnosis not present

## 2022-11-16 MED ORDER — LOVASTATIN 40 MG PO TABS: 40.0000 mg | ORAL_TABLET | Freq: Every day | ORAL | 1 refills | Status: DC

## 2022-11-16 NOTE — Telephone Encounter (Signed)
[facility] requested refill.  

## 2022-12-09 DIAGNOSIS — L820 Inflamed seborrheic keratosis: Secondary | ICD-10-CM | POA: Diagnosis not present

## 2022-12-09 DIAGNOSIS — D2239 Melanocytic nevi of other parts of face: Secondary | ICD-10-CM | POA: Diagnosis not present

## 2022-12-16 DIAGNOSIS — H26493 Other secondary cataract, bilateral: Secondary | ICD-10-CM | POA: Diagnosis not present

## 2022-12-16 DIAGNOSIS — H524 Presbyopia: Secondary | ICD-10-CM | POA: Diagnosis not present

## 2022-12-16 DIAGNOSIS — H04123 Dry eye syndrome of bilateral lacrimal glands: Secondary | ICD-10-CM | POA: Diagnosis not present

## 2023-01-31 ENCOUNTER — Ambulatory Visit: Payer: Medicare Other | Admitting: Nurse Practitioner

## 2023-02-04 ENCOUNTER — Ambulatory Visit: Payer: Medicare Other | Admitting: Nurse Practitioner

## 2023-02-21 ENCOUNTER — Other Ambulatory Visit: Payer: Self-pay | Admitting: Nurse Practitioner

## 2023-02-21 DIAGNOSIS — E039 Hypothyroidism, unspecified: Secondary | ICD-10-CM

## 2023-03-07 ENCOUNTER — Ambulatory Visit (INDEPENDENT_AMBULATORY_CARE_PROVIDER_SITE_OTHER): Payer: Medicare Other | Admitting: Nurse Practitioner

## 2023-03-07 ENCOUNTER — Encounter: Payer: Self-pay | Admitting: Nurse Practitioner

## 2023-03-07 VITALS — BP 120/78 | HR 65 | Temp 97.1°F | Ht 72.0 in | Wt 245.0 lb

## 2023-03-07 DIAGNOSIS — R350 Frequency of micturition: Secondary | ICD-10-CM

## 2023-03-07 DIAGNOSIS — E7800 Pure hypercholesterolemia, unspecified: Secondary | ICD-10-CM

## 2023-03-07 DIAGNOSIS — E039 Hypothyroidism, unspecified: Secondary | ICD-10-CM

## 2023-03-07 DIAGNOSIS — R739 Hyperglycemia, unspecified: Secondary | ICD-10-CM | POA: Diagnosis not present

## 2023-03-07 DIAGNOSIS — N1832 Chronic kidney disease, stage 3b: Secondary | ICD-10-CM

## 2023-03-07 DIAGNOSIS — I10 Essential (primary) hypertension: Secondary | ICD-10-CM

## 2023-03-07 DIAGNOSIS — Z6833 Body mass index (BMI) 33.0-33.9, adult: Secondary | ICD-10-CM

## 2023-03-07 DIAGNOSIS — E6609 Other obesity due to excess calories: Secondary | ICD-10-CM

## 2023-03-07 DIAGNOSIS — N401 Enlarged prostate with lower urinary tract symptoms: Secondary | ICD-10-CM

## 2023-03-07 DIAGNOSIS — J302 Other seasonal allergic rhinitis: Secondary | ICD-10-CM

## 2023-03-07 NOTE — Patient Instructions (Signed)
To take Loratadine or cetrizine (generic for Claritin or zyrtec) 10 mg by mouth daily for allergies.  

## 2023-03-07 NOTE — Progress Notes (Signed)
Careteam: Patient Care Team: [name], NP as PCP - General (Geriatric Medicine) Vida Rigger, MD as Consulting Physician (Gastroenterology) Rollene Rotunda, MD as Consulting Physician (Cardiology) Johnney Ou, MD as Referring Physician (Ophthalmology)  PLACE OF SERVICE:  [facility]  Advanced Directive information Does Patient Have a Medical Advance Directive?: No, Would patient like information on creating a medical advance directive?: Yes (MAU/Ambulatory/Procedural Areas - Information given) (Patient already has paperwork)  Allergies  Allergen Reactions   Paregoric     Chief Complaint  Patient presents with   Medical Management of Chronic Issues    6 month follow-up. Discuss need for td/tdap. Patient c/o dizzy spell about 2 weeks ago.      HPI: Patient is a 82 y.o. male for routine follow up.   A few weeks ago had a bad dizzy spell after he had been outside doing yard work.  He could not stand up due to the dizziness. Went to bed, called his wife to help him stand up and get him to the bathroom. He had to urinate into cup because he could not stand up.  He took a pill for allergies and in 20 mins it improved.  Yesterday he had a similar issue but was not as bad. Took dramamine which helped- did not have side effect.   Htn-controlled  Struggling with the prediabetes regimen. Takes blood sugar weekly.   Has had a few falls since last visit.  Was climbing stairs last summer without hand rails and fell backward going into a home.  Riding lawnmower does not go in reverse and has to use hand truck and fell backwards trying to get it out of the shed.   Review of Systems:  Review of Systems  Constitutional:  Negative for chills, fever and weight loss.  HENT:  Negative for tinnitus.   Respiratory:  Negative for cough, sputum production and shortness of breath.   Cardiovascular:  Negative for chest pain, palpitations and leg swelling.  Gastrointestinal:   Negative for abdominal pain, constipation, diarrhea and heartburn.  Genitourinary:  Negative for dysuria, frequency and urgency.  Musculoskeletal:  Negative for back pain, falls, joint pain and myalgias.  Skin: Negative.   Neurological:  Positive for dizziness. Negative for headaches.  Endo/Heme/Allergies:  Positive for environmental allergies.  Psychiatric/Behavioral:  Negative for depression and memory loss. The patient does not have insomnia.     Past Medical History:  Diagnosis Date   BPH (benign prostatic hypertrophy)    CKD (chronic kidney disease)    ED (erectile dysfunction)    Essential hypertension, benign    Hypothyroidism    Right inguinal hernia    Past Surgical History:  Procedure Laterality Date   APPENDECTOMY     COLONOSCOPY  2017   Per [facility] New Patient Packet    COLONOSCOPY  09/10/2020   Dr.Magod, awaiting biopsy results. 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 His smoking use included cigarettes. He has a 21.00 pack-year  smoking history. He has never used smokeless tobacco. He reports current alcohol use. He reports that he does not use drugs.  Family History  Problem Relation Age of Onset   Parkinson's disease Mother    Stroke Father    Diabetes Father    Heart disease Father    Diabetes Brother    Myasthenia gravis Brother    Diabetes Brother    High Cholesterol Brother    Neuropathy Brother     Medications: Patient's Medications  New  Prescriptions   No medications on file  Previous Medications   AMLODIPINE (NORVASC) 10 MG TABLET    Take 1 tablet (10 mg total) by mouth daily.   CHOLECALCIFEROL (VITAMIN D) 25 MCG (1000 UT) TABLET    Take 2,000 Units by mouth daily.    LEVOTHYROXINE (SYNTHROID) 175 MCG TABLET    TAKE 1 TABLET BY MOUTH DAILY BEFORE BREAKFAST   LOSARTAN (COZAAR) 100 MG TABLET    Take 1 tablet (100 mg total) by mouth daily.   LOVASTATIN (MEVACOR) 40 MG TABLET    Take 1 tablet (40 mg total) by mouth at bedtime.   OMEGA-3 FATTY ACIDS (FISH OIL PO)    Take 2,000 mg by mouth daily.   TAMSULOSIN (FLOMAX) 0.4 MG CAPS CAPSULE    Take 0.4 mg by mouth daily.  Modified Medications   No medications on file  Discontinued Medications   No medications on file    Physical Exam:  Vitals:   03/07/23 0833  BP: 120/78  Pulse: 65  Temp: (!) 97.1 F (36.2 C)  TempSrc: Temporal  SpO2: 97%  Weight: 245 lb (111.1 kg)  Height: 6' (1.829 m)   Body mass index is 33.23 kg/m. Wt Readings from Last 3 Encounters:  03/07/23 245 lb (111.1 kg)  08/02/22 239 lb 9.6 oz (108.7 kg)  01/25/22 259 lb (117.5 kg)    Physical Exam Constitutional:      General: He is not in acute distress.    Appearance: He is well-developed. He is not diaphoretic.  HENT:     Head: Normocephalic and atraumatic.     Right Ear: External ear normal.     Left Ear: External ear normal.     Mouth/Throat:     Pharynx: No oropharyngeal exudate.  Eyes:     Conjunctiva/sclera: Conjunctivae normal.     Pupils: Pupils are equal, round, and reactive to light.  Cardiovascular:     Rate and Rhythm: Normal rate and regular rhythm.     Heart sounds: Normal heart sounds.  Pulmonary:     Effort: Pulmonary effort is normal.     Breath sounds: Normal breath sounds.  Abdominal:     General: Bowel sounds are normal.     Palpations: Abdomen is soft.  Musculoskeletal:        General: No tenderness.     Cervical back: Normal range of motion and neck supple.      Right lower leg: No edema.     Left lower leg: No edema.  Skin:    General: Skin is warm and dry.  Neurological:     Mental Status: He is alert and oriented to person, place, and time.     Labs reviewed: Basic Metabolic Panel: Recent Labs    08/02/22 1043  NA 138  K 4.1  CL 104  CO2 27  GLUCOSE 111*  BUN 28*  CREATININE 1.75*  CALCIUM 9.4   Liver Function Tests: Recent Labs    08/02/22 1043  AST 21  ALT 16  BILITOT 0.9  PROT 7.1   No results for input(s): "LIPASE", "AMYLASE" in the last 8760 hours. No results for input(s): "AMMONIA" in the last 8760 hours. CBC: No results for input(s): "WBC", "NEUTROABS", "HGB", "HCT", "MCV", "PLT" in the last 8760 hours. Lipid Panel: No results for input(s): "CHOL", "HDL", "LDLCALC", "TRIG", "CHOLHDL", "LDLDIRECT" in the last 8760 hours. TSH: No results for input(s): "TSH" in the last 8760 hours. A1C: Lab Results  Component Value Date   HGBA1C 6.1 (H) 08/02/2022     Assessment/Plan 1. Hyperglycemia -continue dietary modifications and increase activity for better glycemic control  - Hemoglobin A1c  2. Essential hypertension -Blood pressure well controlled, goal bp <140/90 Continue current medications and dietary modifications follow metabolic panel - COMPLETE METABOLIC PANEL WITH GFR - CBC with Differential/Platelet  3. Hypothyroidism, unspecified type -continues on synthroid 175 mcg - TSH  4. Stage 3b chronic kidney disease (HCC) -Chronic and stable Encourage proper hydration Follow metabolic panel Avoid nephrotoxic meds (NSAIDS)  5. Pure hypercholesterolemia -continues on lovastatin - Lipid panel  6. Seasonal allergies -likely contributing to dizziness, To take Loratadine or cetrizine (generic for Claritin or zyrtec) 10 mg by mouth daily for allergies.    7. Benign prostatic hyperplasia with urinary frequency Stable on flomax  8. Class 1 obesity due to excess calories with serious comorbidity and  body mass index (BMI) of 33.0 to 33.9 in adult --education provided on healthy weight loss through increase in physical activity and proper nutrition   Return in about 6 months (around 09/07/2023) for routine follow up. labs at appt .:  Mekaylah Klich K. Biagio Borg [facility] [phone]

## 2023-03-08 LAB — COMPLETE METABOLIC PANEL WITH GFR
AG Ratio: 1.6 (calc) (ref 1.0–2.5)
ALT: 24 U/L (ref 9–46)
AST: 26 U/L (ref 10–35)
Albumin: 4.1 g/dL (ref 3.6–5.1)
Alkaline phosphatase (APISO): 72 U/L (ref 35–144)
BUN/Creatinine Ratio: 19 (calc) (ref 6–22)
BUN: 28 mg/dL — ABNORMAL HIGH (ref 7–25)
CO2: 23 mmol/L (ref 20–32)
Calcium: 10 mg/dL (ref 8.6–10.3)
Chloride: 105 mmol/L (ref 98–110)
Creat: 1.48 mg/dL — ABNORMAL HIGH (ref 0.70–1.28)
Globulin: 2.5 g/dL (calc) (ref 1.9–3.7)
Glucose, Bld: 122 mg/dL — ABNORMAL HIGH (ref 65–99)
Potassium: 4.5 mmol/L (ref 3.5–5.3)
Sodium: 140 mmol/L (ref 135–146)
Total Bilirubin: 0.7 mg/dL (ref 0.2–1.2)
Total Protein: 6.6 g/dL (ref 6.1–8.1)
eGFR: 48 mL/min/{1.73_m2} — ABNORMAL LOW (ref >=60)

## 2023-03-08 LAB — CBC WITH DIFFERENTIAL/PLATELET
Absolute Monocytes: 806 cells/uL (ref 200–950)
Basophils Absolute: 72 cells/uL (ref 0–200)
Basophils Relative: 1.1 %
Eosinophils Absolute: 267 cells/uL (ref 15–500)
Eosinophils Relative: 4.1 %
HCT: 45.3 % (ref 38.5–50.0)
Hemoglobin: 15.4 g/dL (ref 13.2–17.1)
Lymphs Abs: 1827 cells/uL (ref 850–3900)
MCH: 30.6 pg (ref 27.0–33.0)
MCHC: 34 g/dL (ref 32.0–36.0)
MCV: 90.1 fL (ref 80.0–100.0)
MPV: 9.4 fL (ref 7.5–12.5)
Monocytes Relative: 12.4 %
Neutro Abs: 3530 cells/uL (ref 1500–7800)
Neutrophils Relative %: 54.3 %
Platelets: 250 10*3/uL (ref 140–400)
RBC: 5.03 10*6/uL (ref 4.20–5.80)
RDW: 12.8 % (ref 11.0–15.0)
Total Lymphocyte: 28.1 %
WBC: 6.5 10*3/uL (ref 3.8–10.8)

## 2023-03-08 LAB — LIPID PANEL
Cholesterol: 106 mg/dL (ref <200)
HDL: 43 mg/dL (ref >=40)
LDL Cholesterol (Calc): 50 mg/dL (calc)
Non-HDL Cholesterol (Calc): 63 mg/dL (calc) (ref <130)
Total CHOL/HDL Ratio: 2.5 (calc) (ref <5.0)
Triglycerides: 58 mg/dL (ref <150)

## 2023-03-08 LAB — HEMOGLOBIN A1C
Hgb A1c MFr Bld: 6.4 % of total Hgb — ABNORMAL HIGH (ref <5.7)
Mean Plasma Glucose: 137 mg/dL
eAG (mmol/L): 7.6 mmol/L

## 2023-03-08 LAB — TSH: TSH: 2.84 mIU/L (ref 0.40–4.50)

## 2023-03-16 DIAGNOSIS — F3342 Major depressive disorder, recurrent, in full remission: Secondary | ICD-10-CM | POA: Diagnosis not present

## 2023-04-14 ENCOUNTER — Telehealth: Payer: Self-pay

## 2023-04-14 DIAGNOSIS — I10 Essential (primary) hypertension: Secondary | ICD-10-CM

## 2023-04-14 MED ORDER — LOSARTAN POTASSIUM 100 MG PO TABS
100.0000 mg | ORAL_TABLET | Freq: Every day | ORAL | 3 refills | Status: DC
Start: 2023-04-14 — End: 2024-05-15

## 2023-04-14 NOTE — Telephone Encounter (Signed)
Pharmacy send rx for Losartan 100 mg and medication send into pharmacy

## 2023-05-09 DIAGNOSIS — M25562 Pain in left knee: Secondary | ICD-10-CM | POA: Diagnosis not present

## 2023-05-09 DIAGNOSIS — M1712 Unilateral primary osteoarthritis, left knee: Secondary | ICD-10-CM | POA: Diagnosis not present

## 2023-05-19 ENCOUNTER — Other Ambulatory Visit: Payer: Self-pay | Admitting: Nurse Practitioner

## 2023-05-19 DIAGNOSIS — I10 Essential (primary) hypertension: Secondary | ICD-10-CM

## 2023-05-23 DIAGNOSIS — K080 Exfoliation of teeth due to systemic causes: Secondary | ICD-10-CM | POA: Diagnosis not present

## 2023-06-27 ENCOUNTER — Other Ambulatory Visit: Payer: Self-pay | Admitting: Nurse Practitioner

## 2023-06-27 DIAGNOSIS — E7800 Pure hypercholesterolemia, unspecified: Secondary | ICD-10-CM

## 2023-09-26 ENCOUNTER — Ambulatory Visit: Payer: Medicare Other | Admitting: Nurse Practitioner

## 2023-10-13 DIAGNOSIS — R059 Cough, unspecified: Secondary | ICD-10-CM | POA: Diagnosis not present

## 2023-10-17 ENCOUNTER — Ambulatory Visit: Payer: Medicare Other | Admitting: Nurse Practitioner

## 2023-10-27 ENCOUNTER — Encounter: Payer: Medicare Other | Admitting: Nurse Practitioner

## 2023-11-08 ENCOUNTER — Encounter: Payer: Medicare Other | Admitting: Nurse Practitioner

## 2023-11-08 ENCOUNTER — Encounter: Payer: Self-pay | Admitting: Nurse Practitioner

## 2023-11-08 ENCOUNTER — Ambulatory Visit: Payer: Medicare Other | Admitting: Nurse Practitioner

## 2023-11-08 DIAGNOSIS — Z0000 Encounter for general adult medical examination without abnormal findings: Secondary | ICD-10-CM | POA: Diagnosis not present

## 2023-11-08 MED ORDER — TAMSULOSIN HCL 0.4 MG PO CAPS: 0.4000 mg | ORAL_CAPSULE | Freq: Every day | ORAL | 0 refills | Status: DC

## 2023-11-08 NOTE — Progress Notes (Signed)
No Answer X 3

## 2023-11-08 NOTE — Progress Notes (Signed)
 Subjective:   [name] is a 83 y.o. male who presents for Medicare Annual/Subsequent preventive examination.  Visit Complete: Virtual I connected with  Emmet R Deskin on 11/08/23 by a video and audio enabled telemedicine application and verified that I am speaking with the correct person using two identifiers.  Patient Location: Home  Provider Location: Office/Clinic  I discussed the limitations of evaluation and management by telemedicine. The patient expressed understanding and agreed to proceed.  Vital Signs: Because this visit was a virtual/telehealth visit, some criteria may be missing or patient reported. Any vitals not documented were not able to be obtained and vitals that have been documented are patient reported.    Cardiac Risk Factors include: advanced age (>43men, >55 women);obesity (BMI >30kg/m2);hypertension;male gender;sedentary lifestyle;dyslipidemia     Objective:    There were no vitals filed for this visit. There is no height or weight on file to calculate BMI.     11/08/2023    9:54 AM 03/07/2023    9:25 AM 10/12/2022   11:21 AM 01/25/2022   10:41 AM 10/06/2021    9:41 AM 03/25/2021   11:03 AM 10/03/2020   11:32 AM  Advanced Directives  Does Patient Have a Medical Advance Directive? Yes No No No No Yes No  Type of Advance Directive Out of facility DNR (pink MOST or yellow form)        Does patient want to make changes to medical advance directive? No - Patient declined     Yes (MAU/Ambulatory/Procedural Areas - Information given) Yes (MAU/Ambulatory/Procedural Areas - Information given)  Would patient like information on creating a medical advance directive?  Yes (MAU/Ambulatory/Procedural Areas - Information given)  Yes (MAU/Ambulatory/Procedural Areas - Information given)       Current Medications (verified) Outpatient Encounter Medications as of 11/08/2023  Medication Sig   amLODipine  (NORVASC ) 10 MG tablet TAKE 1 TABLET BY MOUTH DAILY    cholecalciferol (VITAMIN D ) 25 MCG (1000 UT) tablet Take 2,000 Units by mouth daily.    levothyroxine  (SYNTHROID ) 175 MCG tablet TAKE 1 TABLET BY MOUTH DAILY BEFORE BREAKFAST   losartan  (COZAAR ) 100 MG tablet Take 1 tablet (100 mg total) by mouth daily.   lovastatin  (MEVACOR ) 40 MG tablet TAKE 1 TABLET BY MOUTH AT BEDTIME   Omega-3 Fatty Acids (FISH OIL PO) Take 2,000 mg by mouth daily.   tamsulosin  (FLOMAX ) 0.4 MG CAPS capsule Take 1 capsule (0.4 mg total) by mouth daily.   [DISCONTINUED] tamsulosin  (FLOMAX ) 0.4 MG CAPS capsule Take 0.4 mg by mouth daily. (Patient not taking: Reported on 11/08/2023)   No facility-administered encounter medications on file as of 11/08/2023.    Allergies (verified) Paregoric   History: Past Medical History:  Diagnosis Date   BPH (benign prostatic hypertrophy)    CKD (chronic kidney disease)    ED (erectile dysfunction)    Essential hypertension, benign    Hypothyroidism    Right inguinal hernia    Past Surgical History:  Procedure Laterality Date   APPENDECTOMY     COLONOSCOPY  2017   Per [facility] New Patient Packet    COLONOSCOPY  09/10/2020   Dr.Magod, awaiting biopsy results. 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   Procedure: APPLICATION DIGIT WIDGET LET MIDDLE FINGER;  Surgeon: Arley Curia, MD;  Location: [facility];  Service: Orthopedics;  Laterality: Left;   HERNIA REPAIR     abd   REPAIR EXTENSOR TENDON Left 09/07/2016   Procedure: Reconstruction PULLEY sheath  left middle finger Extensor retinaculum graft;  Surgeon: Arley Curia, MD;  Location: [facility];  Service: Orthopedics;  Laterality: Left;   Family History  Problem Relation Age of Onset   Parkinson's disease Mother    Stroke Father    Diabetes Father    Heart disease Father    Diabetes Brother    Myasthenia gravis Brother    Diabetes Brother    High Cholesterol Brother    Neuropathy Brother    Social History   Socioeconomic  History   Marital status: Married    Spouse name: Not on file   Number of children: 3   Years of education: Not on file   Highest education level: Bachelor's degree (e.g., BA, AB, BS)  Occupational History   Occupation: Textiles  Tobacco Use   Smoking status: Former    Current packs/day: 0.00    Average packs/day: 0.7 packs/day for 30.0 years (21.0 ttl pk-yrs)    Types: Cigarettes    Start date: 10/25/1962    Quit date: 10/25/1992    Years since quitting: 31.0   Smokeless tobacco: Never  Vaping Use   Vaping status: Never Used  Substance and Sexual Activity   Alcohol use: Yes    Comment: Rare    Drug use: No   Sexual activity: Not on file  Other Topics Concern   Not on file  Social History Narrative   Lives with wife.        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 No   Do you have difficulty preparing food or eating? No   Do you have difficulty managing your medications? No   Do you have difficulty managing your finances? No   Do you have difficulty affording your medications? No   Social Drivers of Corporate Investment Banker Strain: Low Risk  (11/05/2023)   Overall Financial Resource Strain (CARDIA)    Difficulty of Paying Living Expenses: Not hard at all  Food Insecurity: No Food Insecurity (11/05/2023)   Hunger Vital Sign    Worried About Running Out of Food in the Last Year: Never true    Ran Out of Food in the Last Year: Never true  Transportation Needs: No Transportation  Needs (11/05/2023)   PRAPARE - Administrator, Civil Service (Medical): No    Lack of Transportation (Non-Medical): No  Physical Activity: Insufficiently Active (11/05/2023)   Exercise Vital Sign    Days of Exercise per Week: 3 days    Minutes of Exercise per Session: 20 min  Stress: No Stress Concern Present (11/05/2023)   [employer] of Occupational Health - Occupational Stress Questionnaire    Feeling of Stress : Not at all  Social Connections: Moderately Isolated (11/05/2023)   Social Connection and Isolation Panel [  NHANES]    Frequency of Communication with Friends and Family: More than three times a week    Frequency of Social Gatherings with Friends and Family: Once a week    Attends Religious Services: Never    Database Administrator or Organizations: No    Attends Engineer, Structural: Not on file    Marital Status: Married    Tobacco Counseling Counseling given: Not Answered   Clinical Intake:  Pre-visit preparation completed: Yes  Pain : No/denies pain     BMI - recorded: 33 Nutritional Risks: None  How often do you need to have someone help you when you read instructions, pamphlets, or other written materials from your doctor or pharmacy?: 1 - Never         Activities of Daily Living    11/08/2023   10:28 AM 11/05/2023    8:56 AM  In your present state of health, do you have any difficulty performing the following activities:  Hearing? 0 0  Vision? 0 0  Difficulty concentrating or making decisions? 0 0  Walking or climbing stairs? 0 0  Dressing or bathing? 0 0  Doing errands, shopping? 0 0  Preparing Food and eating ? N N  Using the Toilet? N N  In the past six months, have you accidently leaked urine? N N  Do you have problems with loss of bowel control? N N  Managing your Medications? N N  Managing your Finances? N N  Housekeeping or managing your Housekeeping? N N    Patient Care Team: Caro Harlene POUR, NP as PCP -  General (Geriatric Medicine) Rosalie Kitchens, MD as Consulting Physician (Gastroenterology) Lavona Agent, MD as Consulting Physician (Cardiology) Storm Prentice DASEN, MD as Referring Physician (Ophthalmology)  Indicate any recent Medical Services you may have received from other than [facility] providers in the past year (date may be approximate).     Assessment:   This is a routine wellness examination for [name].  Hearing/Vision screen Vision Screening - Comments:: Dr. Jodie Lover Last Eye Exam: 11/2022   Goals Addressed   None    Depression Screen    11/08/2023    9:56 AM 03/07/2023    9:23 AM 10/12/2022   11:18 AM 01/25/2022   10:40 AM 10/06/2021    9:38 AM 10/03/2020   11:31 AM 09/24/2020   10:42 AM  PHQ 2/9 Scores  PHQ - 2 Score 0 0 0 0 0 0 0    Fall Risk    11/08/2023    9:55 AM 11/05/2023    8:56 AM 03/07/2023    9:22 AM 10/12/2022   11:20 AM 08/02/2022   10:09 AM  Fall Risk   Falls in the past year? 1 1 1 1 1   Number falls in past yr: 0 1 1 1  0  Injury with Fall? 0 0 0 0 1  Risk for fall due to :   History of fall(s) History of fall(s) History of fall(s)  Follow up   Falls evaluation completed Falls evaluation completed     MEDICARE RISK AT HOME: Medicare Risk at Home Any stairs in or around the home?: Yes If so, are there any without handrails?: No Home free of loose throw rugs in walkways, pet beds, electrical cords, etc?: Yes Adequate lighting in your home to reduce risk of falls?: Yes Life alert?: No Use of a cane, walker or w/c?: No Grab bars in the bathroom?: Yes Shower chair or bench in shower?: No  Elevated toilet seat or a handicapped toilet?: No  TIMED UP AND GO:  Was the test performed?  No    Cognitive Function:        11/08/2023    9:56 AM 10/12/2022   11:21 AM 10/06/2021    9:41 AM 10/03/2020   11:34 AM 10/02/2019   10:04 AM  6CIT Screen  What Year? 0 points 0 points 0 points 0 points 0 points  What month? 0 points 0 points 0 points  0 points 0 points  What time? 0 points 0 points 0 points 0 points 0 points  Count back from 20 0 points 0 points 0 points 0 points 0 points  Months in reverse 0 points 0 points 0 points 0 points 0 points  Repeat phrase 0 points 0 points 0 points 0 points 0 points  Total Score 0 points 0 points 0 points 0 points 0 points    Immunizations Immunization History  Administered Date(s) Administered   Fluad Quad(high Dose 65+) 07/04/2019, 09/24/2020, 08/02/2022   Influenza Whole 10/02/2012   Influenza, High Dose Seasonal PF 08/16/2016, 08/12/2017, 08/30/2018, 10/05/2021, 07/26/2023   Influenza,inj,Quad PF,6+ Mos 08/06/2013, 08/30/2014, 08/15/2015   Moderna Covid-19 Fall Seasonal Vaccine 76yrs & older 11/02/2022, 07/26/2023   Moderna Sars-Covid-2 Vaccination 11/14/2019, 12/17/2019, 09/02/2020, 04/08/2021   Pneumococcal Conjugate-13 08/06/2013   Pneumococcal Polysaccharide-23 11/25/2008   Tdap 07/26/2011   Zoster Recombinant(Shingrix ) 05/23/2020, 11/19/2022   Zoster, Live 01/24/2008    TDAP status: Due, Education has been provided regarding the importance of this vaccine. Advised may receive this vaccine at local pharmacy or Health Dept. Aware to provide a copy of the vaccination record if obtained from local pharmacy or Health Dept. Verbalized acceptance and understanding.  Flu Vaccine status: Up to date  Pneumococcal vaccine status: Up to date  Covid-19 vaccine status: Information provided on how to obtain vaccines.   Qualifies for Shingles Vaccine? Yes   Zostavax completed No   Shingrix  Completed?: Yes  Screening Tests Health Maintenance  Topic Date Due   DTaP/Tdap/Td (2 - Td or Tdap) 07/25/2021   Medicare Annual Wellness (AWV)  11/07/2024   Pneumonia Vaccine 30+ Years old  Completed   INFLUENZA VACCINE  Completed   COVID-19 Vaccine  Completed   Zoster Vaccines- Shingrix   Completed   HPV VACCINES  Aged Out   Colonoscopy  Discontinued   Hepatitis C Screening  Discontinued     Health Maintenance  Health Maintenance Due  Topic Date Due   DTaP/Tdap/Td (2 - Td or Tdap) 07/25/2021    Colorectal cancer screening: No longer required.   Lung Cancer Screening: (Low Dose CT Chest recommended if Age 38-80 years, 20 pack-year currently smoking OR have quit w/in 15years.) does not qualify.   Lung Cancer Screening Referral: na  Additional Screening:  Hepatitis C Screening: does not qualify; Completed   Vision Screening: Recommended annual ophthalmology exams for early detection of glaucoma and other disorders of the eye. Is the patient up to date with their annual eye exam?  Yes  Who is the provider or what is the name of the office in which the patient attends annual eye exams? Dr Marlan If pt is not established with a provider, would they like to be referred to a provider to establish care? No .   Dental Screening: Recommended annual dental exams for proper oral hygiene   Community Resource Referral / Chronic Care Management: CRR required this visit?  No   CCM required this visit?  No  Plan:     I have personally reviewed and noted the following in the patient's chart:   Medical and social history Use of alcohol, tobacco or illicit drugs  Current medications and supplements including opioid prescriptions. Patient is not currently taking opioid prescriptions. Functional ability and status Nutritional status Physical activity Advanced directives List of other physicians Hospitalizations, surgeries, and ER visits in previous 12 months Vitals Screenings to include cognitive, depression, and falls Referrals and appointments  In addition, I have reviewed and discussed with patient certain preventive protocols, quality metrics, and best practice recommendations. A written personalized care plan for preventive services as well as general preventive health recommendations were provided to patient.     Harlene MARLA An, NP   11/08/2023    After Visit Summary: (MyChart) Due to this being a telephonic visit, the after visit summary with patients personalized plan was offered to patient via MyChart

## 2023-11-08 NOTE — Patient Instructions (Signed)
  [name] , Thank you for taking time to come for your Medicare Wellness Visit. I appreciate your ongoing commitment to your health goals. Please review the following plan we discussed and let me know if I can assist you in the future.  To look up TDAP and let us  know the last date.    This is a list of the screening recommended for you and due dates:  Health Maintenance  Topic Date Due   DTaP/Tdap/Td vaccine (2 - Td or Tdap) 07/25/2021   Medicare Annual Wellness Visit  11/07/2024   Pneumonia Vaccine  Completed   Flu Shot  Completed   COVID-19 Vaccine  Completed   Zoster (Shingles) Vaccine  Completed   HPV Vaccine  Aged Out   Colon Cancer Screening  Discontinued   Hepatitis C Screening  Discontinued

## 2023-11-08 NOTE — Progress Notes (Signed)
 This encounter was created in error - please disregard.

## 2023-11-08 NOTE — Progress Notes (Signed)
 This service is provided via telemedicine  No vital signs collected/recorded due to the encounter was a telemedicine visit.   Location of patient (ex: home, work):  Home  Patient consents to a telephone visit:  Yes  Location of the provider (ex: office, home):  Office [name]   Name of any referring provider:  na  Names of all persons participating in the telemedicine service and their role in the encounter:  [name], patient, Donzell Beal, CMA, Harlene An, NP  Time spent on call:  7:10

## 2023-11-25 ENCOUNTER — Ambulatory Visit (INDEPENDENT_AMBULATORY_CARE_PROVIDER_SITE_OTHER): Payer: Medicare Other | Admitting: Nurse Practitioner

## 2023-11-25 ENCOUNTER — Encounter: Payer: Self-pay | Admitting: Nurse Practitioner

## 2023-11-25 VITALS — BP 120/88 | HR 70 | Temp 97.4°F | Resp 18 | Ht 72.0 in | Wt 252.4 lb

## 2023-11-25 DIAGNOSIS — R739 Hyperglycemia, unspecified: Secondary | ICD-10-CM

## 2023-11-25 DIAGNOSIS — E7800 Pure hypercholesterolemia, unspecified: Secondary | ICD-10-CM | POA: Diagnosis not present

## 2023-11-25 DIAGNOSIS — N1832 Chronic kidney disease, stage 3b: Secondary | ICD-10-CM | POA: Diagnosis not present

## 2023-11-25 DIAGNOSIS — E6609 Other obesity due to excess calories: Secondary | ICD-10-CM

## 2023-11-25 DIAGNOSIS — Z6833 Body mass index (BMI) 33.0-33.9, adult: Secondary | ICD-10-CM

## 2023-11-25 DIAGNOSIS — I10 Essential (primary) hypertension: Secondary | ICD-10-CM | POA: Diagnosis not present

## 2023-11-25 DIAGNOSIS — E039 Hypothyroidism, unspecified: Secondary | ICD-10-CM | POA: Diagnosis not present

## 2023-11-25 DIAGNOSIS — E66811 Obesity, class 1: Secondary | ICD-10-CM

## 2023-11-25 DIAGNOSIS — R350 Frequency of micturition: Secondary | ICD-10-CM

## 2023-11-25 DIAGNOSIS — N401 Enlarged prostate with lower urinary tract symptoms: Secondary | ICD-10-CM

## 2023-11-25 NOTE — Progress Notes (Signed)
Careteam: Patient Care Team: [name], NP as PCP - General (Geriatric Medicine) Vida Rigger, MD as Consulting Physician (Gastroenterology) Rollene Rotunda, MD as Consulting Physician (Cardiology) Johnney Ou, MD as Referring Physician (Ophthalmology)  PLACE OF SERVICE:  [facility]  Advanced Directive information Does Patient Have a Medical Advance Directive?: Yes, Type of Advance Directive: Out of facility DNR (pink MOST or yellow form), Does patient want to make changes to medical advance directive?: No - Patient declined  Allergies  Allergen Reactions   Paregoric    Chief Complaint  Patient presents with   Medical Management of Chronic Issues     6 month follow-up   Immunizations    DTAP   HPI: Patient is a 83 y.o. male who comes in today for his routine 6 month chronic visit. He has gained 7 lbs since his last visit in May 2024. He states he recently restarted going to the gym twice a week for 40 minutes each. He had stopped going to the gym several years ago due to the COVID 19 pandemic.   He complains of constipation which is a chronic issue for him.   He feels that he had a dose of TDAP at [facility] pharmacy 2 years ago- he will check with the pharmacy and if not he verbalized he will get his TDAP vaccine.   He follows up with urology for his BPH management.   He states that his blood sugar remains in the 120s but wishes to try lifestyle modifications at this time.   Review of Systems:  Review of Systems  Constitutional: Negative.   HENT: Negative.    Eyes: Negative.   Respiratory: Negative.    Cardiovascular:  Positive for leg swelling.  Gastrointestinal:  Positive for constipation.  Genitourinary:  Positive for frequency.  Musculoskeletal: Negative.   Skin: Negative.   Neurological: Negative.   Endo/Heme/Allergies: Negative.   Psychiatric/Behavioral: Negative.     Past Medical History:  Diagnosis Date   BPH (benign prostatic hypertrophy)     CKD (chronic kidney disease)    ED (erectile dysfunction)    Essential hypertension, benign    Hypothyroidism    Right inguinal hernia    Past Surgical History:  Procedure Laterality Date   APPENDECTOMY     COLONOSCOPY  2017   Per [facility] New Patient Packet    COLONOSCOPY  09/10/2020   Dr.Magod, awaiting biopsy results. 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 His smoking use included cigarettes. He started smoking about 61 years ago. He has a 21 pack-year smoking history. He has never used smokeless tobacco. He reports current alcohol use. He reports that he does not use drugs.  Family History  Problem Relation Age of Onset   Parkinson's disease Mother    Stroke Father    Diabetes Father    Heart disease Father    Diabetes Brother    Myasthenia gravis Brother    Diabetes Brother    High Cholesterol Brother  Neuropathy Brother    Medications: Patient's Medications  New Prescriptions   No medications on file  Previous Medications   AMLODIPINE (NORVASC) 10 MG TABLET    TAKE 1 TABLET BY MOUTH DAILY   CHOLECALCIFEROL (VITAMIN D) 25 MCG (1000 UT) TABLET    Take 2,000 Units by mouth daily.    LEVOTHYROXINE (SYNTHROID) 175 MCG TABLET    TAKE 1 TABLET BY MOUTH DAILY BEFORE BREAKFAST   LOSARTAN (COZAAR) 100 MG TABLET    Take 1 tablet (100 mg total) by mouth daily.   LOVASTATIN (MEVACOR) 40 MG TABLET    TAKE 1 TABLET BY MOUTH AT BEDTIME   OMEGA-3 FATTY ACIDS (FISH OIL PO)     Take 2,000 mg by mouth daily.   TAMSULOSIN (FLOMAX) 0.4 MG CAPS CAPSULE    Take 1 capsule (0.4 mg total) by mouth daily.  Modified Medications   No medications on file  Discontinued Medications   No medications on file   Physical Exam:  Vitals:   11/25/23 1052  BP: 120/88  Pulse: 70  Resp: 18  Temp: (!) 97.4 F (36.3 C)  SpO2: 97%  Weight: 252 lb 6.4 oz (114.5 kg)  Height: 6' (1.829 m)   Body mass index is 34.23 kg/m. Wt Readings from Last 3 Encounters:  11/25/23 252 lb 6.4 oz (114.5 kg)  03/07/23 245 lb (111.1 kg)  08/02/22 239 lb 9.6 oz (108.7 kg)   Physical Exam Vitals reviewed.  Constitutional:      Appearance: Normal appearance.  HENT:     Head: Normocephalic and atraumatic.     Right Ear: External ear normal.     Left Ear: External ear normal.     Nose: Nose normal.     Mouth/Throat:     Mouth: Mucous membranes are moist.     Pharynx: Oropharynx is clear.  Eyes:     Conjunctiva/sclera: Conjunctivae normal.  Cardiovascular:     Rate and Rhythm: Normal rate and regular rhythm.     Pulses: Normal pulses.     Heart sounds: Normal heart sounds.  Pulmonary:     Effort: Pulmonary effort is normal.     Breath sounds: Normal breath sounds.  Abdominal:     General: Bowel sounds are decreased.     Palpations: Abdomen is soft.     Tenderness: There is no abdominal tenderness.  Musculoskeletal:     Cervical back: Neck supple.     Right lower leg: Edema present.     Left lower leg: Edema present.  Skin:    General: Skin is warm and dry.  Neurological:     General: No focal deficit present.     Mental Status: He is alert and oriented to person, place, and time. Mental status is at baseline.  Psychiatric:        Mood and Affect: Mood normal.        Behavior: Behavior normal.   Labs reviewed: Basic Metabolic Panel: Recent Labs    03/07/23 1001  NA 140  K 4.5  CL 105  CO2 23  GLUCOSE 122*  BUN 28*  CREATININE 1.48*  CALCIUM 10.0  TSH 2.84   Liver  Function Tests: Recent Labs    03/07/23 1001  AST 26  ALT 24  BILITOT 0.7  PROT 6.6   No results for input(s): "LIPASE", "AMYLASE" in the last 8760 hours. No results for input(s): "AMMONIA" in the last 8760 hours. CBC: Recent Labs    03/07/23 1001  WBC 6.5  NEUTROABS 3,530  HGB 15.4  HCT 45.3  MCV 90.1  PLT 250   Lipid Panel: Recent Labs    03/07/23 1001  CHOL 106  HDL 43  LDLCALC 50  TRIG 58  CHOLHDL 2.5   TSH: Recent Labs    03/07/23 1001  TSH 2.84   A1C: Lab Results  Component Value Date   HGBA1C 6.4 (H) 03/07/2023   Assessment/Plan  1. Essential hypertension (Primary) - COMPLETE METABOLIC PANEL WITH GFR to be collected in office today. - CBC with Differential/Platelet to be collected in office today. - BP 120/88 today- discussed low sodium heart healthy diet. - Continue amplodipine and losartan as prescribed.    2. Hyperglycemia - Hemoglobin A1c to be collected in office today. - Discussed lifestyle modifications and low carb health healthy diet. - Discussed with patient if hgb A1C is elevate, will need medication to control blood sugar levels if unable to control with lifestyle changes.  3. Hypothyroidism, unspecified type - TSH to be collected in office today. - Continue levothyroxine as prescribed.  4. Stage 3b chronic kidney disease (HCC) -Chronic and stable Encourage proper hydration Follow metabolic panel Avoid nephrotoxic meds (NSAIDS) - COMPLETE METABOLIC PANEL WITH GFR to be collected in office today.   5. Pure hypercholesterolemia  - Lipid panel to be collected in office today. - Continue lovastatin as prescribed.  6. Benign prostatic hyperplasia with urinary frequency - Stable, pt followed up urology. - Continue tamsulosin as prescribed.   7. Class 1 obesity due to excess calories with serious comorbidity and body mass index (BMI) of 33.0 to 33.9 in adult - BMI 34.23 today. - Continue going to the gym twice a week. -  Discussed dietary changes and highlighted importance of low carb low sodium heart healthy diet.   Return in 6 months for a routine follow up.   Lenord Fellers, RN DNP-AGPCNP Student -I personally was present during the history, physical exam and medical decision-making activities of this service and have verified that the service and findings are accurately documented in the student's note Lliam Hoh K. Biagio Borg [facility] [phone]

## 2023-11-26 LAB — CBC WITH DIFFERENTIAL/PLATELET
Absolute Lymphocytes: 1968 {cells}/uL (ref 850–3900)
Absolute Monocytes: 740 {cells}/uL (ref 200–950)
Basophils Absolute: 67 {cells}/uL (ref 0–200)
Basophils Relative: 0.9 %
Eosinophils Absolute: 281 {cells}/uL (ref 15–500)
Eosinophils Relative: 3.8 %
HCT: 44.6 % (ref 38.5–50.0)
Hemoglobin: 15.3 g/dL (ref 13.2–17.1)
MCH: 30.8 pg (ref 27.0–33.0)
MCHC: 34.3 g/dL (ref 32.0–36.0)
MCV: 89.7 fL (ref 80.0–100.0)
MPV: 9.4 fL (ref 7.5–12.5)
Monocytes Relative: 10 %
Neutro Abs: 4344 {cells}/uL (ref 1500–7800)
Neutrophils Relative %: 58.7 %
Platelets: 279 10*3/uL (ref 140–400)
RBC: 4.97 10*6/uL (ref 4.20–5.80)
RDW: 12.8 % (ref 11.0–15.0)
Total Lymphocyte: 26.6 %
WBC: 7.4 10*3/uL (ref 3.8–10.8)

## 2023-11-26 LAB — COMPLETE METABOLIC PANEL WITH GFR
AG Ratio: 1.9 (calc) (ref 1.0–2.5)
ALT: 28 U/L (ref 9–46)
AST: 29 U/L (ref 10–35)
Albumin: 4.7 g/dL (ref 3.6–5.1)
Alkaline phosphatase (APISO): 71 U/L (ref 35–144)
BUN/Creatinine Ratio: 14 (calc) (ref 6–22)
BUN: 22 mg/dL (ref 7–25)
CO2: 28 mmol/L (ref 20–32)
Calcium: 9.7 mg/dL (ref 8.6–10.3)
Chloride: 102 mmol/L (ref 98–110)
Creat: 1.57 mg/dL — ABNORMAL HIGH (ref 0.70–1.22)
Globulin: 2.5 g/dL (ref 1.9–3.7)
Glucose, Bld: 117 mg/dL — ABNORMAL HIGH (ref 65–99)
Potassium: 4.2 mmol/L (ref 3.5–5.3)
Sodium: 139 mmol/L (ref 135–146)
Total Bilirubin: 0.9 mg/dL (ref 0.2–1.2)
Total Protein: 7.2 g/dL (ref 6.1–8.1)
eGFR: 44 mL/min/{1.73_m2} — ABNORMAL LOW (ref >=60)

## 2023-11-26 LAB — LIPID PANEL
Cholesterol: 114 mg/dL (ref <200)
HDL: 47 mg/dL (ref >=40)
LDL Cholesterol (Calc): 50 mg/dL
Non-HDL Cholesterol (Calc): 67 mg/dL (ref <130)
Total CHOL/HDL Ratio: 2.4 (calc) (ref <5.0)
Triglycerides: 86 mg/dL (ref <150)

## 2023-11-26 LAB — TSH: TSH: 6.31 m[IU]/L — ABNORMAL HIGH (ref 0.40–4.50)

## 2023-11-26 LAB — HEMOGLOBIN A1C
Hgb A1c MFr Bld: 6.6 %{Hb} — ABNORMAL HIGH (ref <5.7)
Mean Plasma Glucose: 143 mg/dL
eAG (mmol/L): 7.9 mmol/L

## 2023-11-28 ENCOUNTER — Encounter: Payer: Self-pay | Admitting: Nurse Practitioner

## 2023-11-28 DIAGNOSIS — K080 Exfoliation of teeth due to systemic causes: Secondary | ICD-10-CM | POA: Diagnosis not present

## 2023-12-05 ENCOUNTER — Telehealth: Payer: Self-pay

## 2023-12-05 ENCOUNTER — Other Ambulatory Visit: Payer: Self-pay | Admitting: Nurse Practitioner

## 2023-12-05 DIAGNOSIS — I10 Essential (primary) hypertension: Secondary | ICD-10-CM

## 2023-12-05 NOTE — Telephone Encounter (Signed)
 Per E-Prescribed Message Needing Your Attention Response:  An error occurred while processing the e-prescribing message.   The message was not sent electronically to the requested pharmacy. Contact the pharmacy about the approved prescription.   Code: 900 - Transaction rejected  Description Code: Code: 1000 - Unable to identify based on the information submitted.  Original Rx cannot be found in the system   Call the pharmacy this morning to let know that we received prescription error for the patient amlodipine . The Pharmacist Larence Pleas stated that they are still working on the medication and they send medication to the patient home.

## 2023-12-21 ENCOUNTER — Other Ambulatory Visit: Payer: Self-pay | Admitting: Nurse Practitioner

## 2023-12-21 DIAGNOSIS — H04123 Dry eye syndrome of bilateral lacrimal glands: Secondary | ICD-10-CM | POA: Diagnosis not present

## 2023-12-21 DIAGNOSIS — H26493 Other secondary cataract, bilateral: Secondary | ICD-10-CM | POA: Diagnosis not present

## 2023-12-21 DIAGNOSIS — E7800 Pure hypercholesterolemia, unspecified: Secondary | ICD-10-CM

## 2023-12-21 DIAGNOSIS — H524 Presbyopia: Secondary | ICD-10-CM | POA: Diagnosis not present

## 2023-12-28 DIAGNOSIS — L02214 Cutaneous abscess of groin: Secondary | ICD-10-CM | POA: Diagnosis not present

## 2024-01-07 DIAGNOSIS — R9431 Abnormal electrocardiogram [ECG] [EKG]: Secondary | ICD-10-CM | POA: Diagnosis not present

## 2024-01-07 DIAGNOSIS — Z87891 Personal history of nicotine dependence: Secondary | ICD-10-CM | POA: Diagnosis not present

## 2024-01-07 DIAGNOSIS — R0789 Other chest pain: Secondary | ICD-10-CM | POA: Diagnosis not present

## 2024-01-07 DIAGNOSIS — R071 Chest pain on breathing: Secondary | ICD-10-CM | POA: Diagnosis not present

## 2024-01-07 DIAGNOSIS — I4510 Unspecified right bundle-branch block: Secondary | ICD-10-CM | POA: Diagnosis not present

## 2024-01-11 ENCOUNTER — Telehealth: Payer: Self-pay | Admitting: *Deleted

## 2024-01-11 NOTE — Telephone Encounter (Signed)
 Copied from CRM [id number]. Topic: Referral - Request for Referral >> Jan 11, 2024  8:57 AM Maxwell Marion wrote: Did the patient discuss referral with their provider in the last year? Unsure if patient has discussed this with NP Eubanks but patient was seen in the ED for chest wall pain and cardiologist appointment was set following. They are needing a referral sent and patient's latest labs and tests sent to them  (If No - schedule appointment) (If Yes - send message)  Appointment offered? Patient's appointment is schedule for June 9th  Type of order/referral and detailed reason for visit: Cardiology  Preference of office, provider, location: [facility], Dr. Rollene Rotunda  If referral order, have you been seen by this specialty before? Was seen back in 2018 by this specialty before, same location and doctor  (If Yes, this issue or another issue? When? Where?  Can we respond through MyChart? Yes      Forwarded to [employer]

## 2024-01-11 NOTE — Telephone Encounter (Signed)
 It looks like he was in the ED, please schedule appt for follow up, can be virtual

## 2024-01-12 NOTE — Telephone Encounter (Signed)
 Patient notified and scheduled a Video Visit for 01/17/2024.

## 2024-01-17 ENCOUNTER — Telehealth (INDEPENDENT_AMBULATORY_CARE_PROVIDER_SITE_OTHER): Admitting: Nurse Practitioner

## 2024-01-17 DIAGNOSIS — N189 Chronic kidney disease, unspecified: Secondary | ICD-10-CM | POA: Diagnosis not present

## 2024-01-17 DIAGNOSIS — M940 Chondrocostal junction syndrome [Tietze]: Secondary | ICD-10-CM

## 2024-01-17 DIAGNOSIS — Z8249 Family history of ischemic heart disease and other diseases of the circulatory system: Secondary | ICD-10-CM | POA: Diagnosis not present

## 2024-01-17 DIAGNOSIS — I10 Essential (primary) hypertension: Secondary | ICD-10-CM

## 2024-01-17 DIAGNOSIS — E7800 Pure hypercholesterolemia, unspecified: Secondary | ICD-10-CM

## 2024-01-17 DIAGNOSIS — I129 Hypertensive chronic kidney disease with stage 1 through stage 4 chronic kidney disease, or unspecified chronic kidney disease: Secondary | ICD-10-CM

## 2024-01-17 DIAGNOSIS — N1832 Chronic kidney disease, stage 3b: Secondary | ICD-10-CM

## 2024-01-17 NOTE — Progress Notes (Signed)
 Careteam: Patient Care Team: [name], NP as PCP - General (Geriatric Medicine) Vida Rigger, MD as Consulting Physician (Gastroenterology) Rollene Rotunda, MD as Consulting Physician (Cardiology) Johnney Ou, MD as Referring Physician (Ophthalmology)  Advanced Directive information    Allergies  Allergen Reactions   Paregoric     Chief Complaint  Patient presents with   Referral Request    Requesting Referral to Cardiologist. Had chest pains a week ago Saturday and feels that he needs to be checked out, Went to Urgent Care. Feels that he needs to be checked out by a Cardiologist. Has seen a Cardiologist in the past but it has been more than 3 years, requesting to go to Dr. Antoine Poche. Has an appointment for 04/02/2024     HPI: Patient is a 83 y.o. male via virtual visit Discussed the use of AI scribe software for clinical note transcription with the patient, who gave verbal consent to proceed.  History of Present Illness   [name] "[name]" is an 83 year old male with chronic kidney disease who is following up due to  chest pain.   Two weeks ago, he began experiencing intermittent sharp chest pains, initially attributing them to a pulled muscle. The pain worsened and became continuous on March 15, prompting a visit to the emergency room at [facility] in [place], [place]. An EKG ruled out a heart attack, with blood work and other tests. He was diagnosised with costochondritis. He now only experiences mild sharp pain occasionally, such as when sneezing. No current chest pain or palpitations, and his blood pressure has been stable.  He has a history of chronic kidney disease and has been advised to avoid NSAIDs like Aleve, ibuprofen, Motrin, and Advil due to potential kidney damage.  He last saw his cardiologist in 2018, where he underwent a stress test and was advised to follow up as needed. His father had bypass surgery in his 30s and died at 70  from a stroke, while his mother had no history of heart disease. He has made follow up with Dr Antoine Poche and request referral.      Review of Systems:  Review of Systems  Constitutional:  Negative for chills, fever and weight loss.  HENT:  Negative for tinnitus.   Respiratory:  Negative for cough, sputum production and shortness of breath.   Cardiovascular:  Negative for chest pain, palpitations and leg swelling.  Gastrointestinal:  Negative for abdominal pain, constipation, diarrhea and heartburn.  Genitourinary:  Negative for dysuria, frequency and urgency.  Musculoskeletal:  Negative for back pain, falls, joint pain and myalgias.  Skin: Negative.   Neurological:  Negative for dizziness and headaches.  Psychiatric/Behavioral:  Negative for depression and memory loss. The patient does not have insomnia.     Past Medical History:  Diagnosis Date   BPH (benign prostatic hypertrophy)    CKD (chronic kidney disease)    ED (erectile dysfunction)    Essential hypertension, benign    Hypothyroidism    Right inguinal hernia    Past Surgical History:  Procedure Laterality Date   APPENDECTOMY     COLONOSCOPY  2017   Per [facility] New Patient Packet    COLONOSCOPY  09/10/2020   Dr.Magod, awaiting biopsy results. 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 His smoking use included cigarettes. He started smoking about 61 years ago. He has a 21 pack-year smoking history. He has never used  smokeless tobacco. He reports current alcohol use. He reports that he does not use drugs.  Family History  Problem Relation Age of Onset   Parkinson's disease Mother    Stroke Father    Diabetes Father    Heart disease Father    Diabetes Brother    Myasthenia gravis Brother    Diabetes Brother    High Cholesterol Brother    Neuropathy Brother     Medications: Patient's Medications  New Prescriptions   No medications on file  Previous Medications   AMLODIPINE (NORVASC) 10 MG TABLET    TAKE 1 TABLET BY MOUTH DAILY   CHOLECALCIFEROL (VITAMIN D) 25 MCG (1000 UT) TABLET    Take 2,000 Units by mouth daily.    LEVOTHYROXINE (SYNTHROID) 175 MCG TABLET    TAKE 1 TABLET BY MOUTH DAILY BEFORE BREAKFAST   LOSARTAN (COZAAR) 100 MG TABLET    Take 1 tablet (100 mg total) by mouth daily.   LOVASTATIN (MEVACOR) 40 MG TABLET    TAKE 1 TABLET BY MOUTH AT BEDTIME   OMEGA-3 FATTY ACIDS (FISH OIL PO)    Take 2,000 mg by mouth daily.   TAMSULOSIN (FLOMAX) 0.4 MG CAPS CAPSULE    Take 1 capsule (0.4 mg total) by mouth daily.  Modified Medications   No medications on file  Discontinued Medications   No medications on file    Physical Exam:  There were no vitals filed for this visit. There is no height or weight on file to calculate BMI. Wt Readings from Last 3 Encounters:  11/25/23 252 lb 6.4 oz (114.5 kg)  03/07/23 245 lb (111.1 kg)  08/02/22 239 lb 9.6 oz (108.7 kg)    Physical Exam Constitutional:      Appearance: Normal appearance.  Neurological:     Mental Status: He is alert. Mental status is at baseline.  Psychiatric:        Mood and Affect: Mood normal.     Labs reviewed: Basic Metabolic Panel: Recent Labs    03/07/23 1001 11/25/23 1147  NA 140 139  K 4.5 4.2  CL 105 102  CO2 23 28  GLUCOSE 122* 117*  BUN 28* 22  CREATININE 1.48* 1.57*  CALCIUM 10.0 9.7  TSH 2.84 6.31*   Liver Function Tests: Recent Labs    03/07/23 1001 11/25/23 1147  AST 26 29  ALT 24 28   BILITOT 0.7 0.9  PROT 6.6 7.2   No results for input(s): "LIPASE", "AMYLASE" in the last 8760 hours. No results for input(s): "AMMONIA" in the last 8760 hours. CBC: Recent Labs    03/07/23 1001 11/25/23 1147  WBC 6.5 7.4  NEUTROABS 3,530 4,344  HGB 15.4 15.3  HCT 45.3 44.6  MCV 90.1 89.7  PLT 250 279   Lipid Panel: Recent Labs    03/07/23 1001 11/25/23 1147  CHOL 106 114  HDL 43 47  LDLCALC 50 50  TRIG 58 86  CHOLHDL 2.5 2.4   TSH: Recent Labs    03/07/23 1001 11/25/23 1147  TSH 2.84 6.31*  A1C: Lab Results  Component Value Date   HGBA1C 6.6 (H) 11/25/2023     Assessment/Plan Assessment and Plan    Costochondritis Improved, with effective pain management using lidocaine patches. Occasional sharp pain with sneezing. - Continue lidocaine patches as needed. - Acetaminophen 1000 mg every 8 hours as needed. - Avoid NSAIDs due to chronic kidney disease.  Chronic Kidney Disease Chronic kidney disease with stable creatinine levels. Advised to avoid NSAIDs and maintain proper hydration.  Family hx of CV disease. No cardiology follow-up since 2018. Family history of cardiovascular disease. Blood pressure controlled and cholesterol well controlled on lovastatin. - referral to cardiologist. - continue Monitor blood pressure and cholesterol levels regularly.      Janene Harvey. Biagio Borg  [facility] [phone]    Virtual Visit via video  I connected with patient on 01/17/24 at 10:20 AM EDT by mychart and verified that I am speaking with the correct person using two identifiers.  Location: Patient: home Provider: clinic   I discussed the limitations, risks, security and privacy concerns of performing an evaluation and management service by telephone and the availability of in person appointments. I also discussed with the patient that there may be a patient responsible charge related to this service. The patient expressed  understanding and agreed to proceed.   I discussed the assessment and treatment plan with the patient. The patient was provided an opportunity to ask questions and all were answered. The patient agreed with the plan and demonstrated an understanding of the instructions.   The patient was advised to call back or seek an in-person evaluation if the symptoms worsen or if the condition fails to improve as anticipated.  I provided 15 minutes of non-face-to-face time during this encounter.  Janene Harvey. Biagio Borg Avs printed and mailed

## 2024-02-29 ENCOUNTER — Other Ambulatory Visit: Payer: Self-pay | Admitting: Nurse Practitioner

## 2024-02-29 DIAGNOSIS — E039 Hypothyroidism, unspecified: Secondary | ICD-10-CM

## 2024-03-29 DIAGNOSIS — G8929 Other chronic pain: Secondary | ICD-10-CM | POA: Diagnosis not present

## 2024-03-29 DIAGNOSIS — Z85828 Personal history of other malignant neoplasm of skin: Secondary | ICD-10-CM | POA: Diagnosis not present

## 2024-04-02 ENCOUNTER — Ambulatory Visit: Admitting: Cardiology

## 2024-05-04 ENCOUNTER — Other Ambulatory Visit: Payer: Self-pay | Admitting: Nurse Practitioner

## 2024-05-15 ENCOUNTER — Other Ambulatory Visit: Payer: Self-pay | Admitting: Nurse Practitioner

## 2024-05-15 DIAGNOSIS — I10 Essential (primary) hypertension: Secondary | ICD-10-CM

## 2024-05-28 ENCOUNTER — Other Ambulatory Visit: Payer: Medicare Other

## 2024-05-28 ENCOUNTER — Encounter: Payer: Self-pay | Admitting: Nurse Practitioner

## 2024-05-28 ENCOUNTER — Ambulatory Visit (INDEPENDENT_AMBULATORY_CARE_PROVIDER_SITE_OTHER): Payer: Medicare Other | Admitting: Nurse Practitioner

## 2024-05-28 VITALS — BP 128/84 | HR 71 | Temp 97.8°F | Resp 12 | Ht 72.0 in | Wt 242.2 lb

## 2024-05-28 DIAGNOSIS — N1832 Chronic kidney disease, stage 3b: Secondary | ICD-10-CM | POA: Diagnosis not present

## 2024-05-28 DIAGNOSIS — E7800 Pure hypercholesterolemia, unspecified: Secondary | ICD-10-CM | POA: Diagnosis not present

## 2024-05-28 DIAGNOSIS — R739 Hyperglycemia, unspecified: Secondary | ICD-10-CM | POA: Diagnosis not present

## 2024-05-28 DIAGNOSIS — I10 Essential (primary) hypertension: Secondary | ICD-10-CM | POA: Diagnosis not present

## 2024-05-28 DIAGNOSIS — E039 Hypothyroidism, unspecified: Secondary | ICD-10-CM | POA: Diagnosis not present

## 2024-05-28 DIAGNOSIS — N401 Enlarged prostate with lower urinary tract symptoms: Secondary | ICD-10-CM | POA: Diagnosis not present

## 2024-05-28 DIAGNOSIS — R350 Frequency of micturition: Secondary | ICD-10-CM | POA: Diagnosis not present

## 2024-05-28 LAB — PSA: PSA: 3.55 ng/mL (ref <=4.00)

## 2024-05-28 NOTE — Progress Notes (Signed)
 Careteam: Patient Care Team: Caro Harlene POUR, NP as PCP - General (Geriatric Medicine) Rosalie Kitchens, MD as Consulting Physician (Gastroenterology) Lavona Agent, MD as Consulting Physician (Cardiology) Storm Prentice DASEN, MD as Referring Physician (Ophthalmology)  PLACE OF SERVICE:  [facility]  Advanced Directive information    Allergies  Allergen Reactions   Paregoric     Chief Complaint  Patient presents with   Medical Management of Chronic Issues    6 month follow up //Pt stated he's been experiencing some dizziness but haven't experienced any today.     HPI:  Discussed the use of AI scribe software for clinical note transcription with the patient, who gave verbal consent to proceed.  History of Present Illness [name] is an 83 year old male who presents for a six-month follow-up.  He has experienced dizziness and lightheadedness on two occasions over the past six weeks. The first episode occurred while visiting his brother-in-law in [place], who had a heart attack. He felt lightheaded after getting up from sitting on a deck. The symptoms resolved after resting. The second episode was similar, with dizziness and a feeling of being unbalanced, resolving within five to ten minutes. He is trying to stay hydrated to prevent these episodes.  He has a history of thyroid  issues, with previous difficulty in finding the right dosage for his medication. He recalls that his thyroid  was 'kind of out' at his last visit, but he has been stable on a dose of 175 mcg for some time.  He is concerned about his blood glucose levels, which he monitors weekly. His fasting glucose was 121 mg/dL this morning, with fluctuations between 112 and 130 mg/dL. He is cautious about his carbohydrate intake, as it affects his glucose levels more than sugar.  He has a history of chest pain, which was evaluated in the hospital and determined to be related to costochondritis from overuse of  weight machines at the gym. An EKG ruled out a heart attack.  He is on Flomax  for prostate issues, experiencing nocturia three to four times a night, which has not changed significantly.  He reports knee pain, particularly when using stairs, and uses Voltaren gel for relief. He has seen an orthopedist who advised against knee replacement at this time.  His twin sisters, who are 84 years old, currently have COVID-19. He has not had direct contact with them but feels he might be developing a stuffy nose. No shortness of breath or palpitations. No significant changes in nocturia. No issues with constipation, managed with Miralax.  ***  Review of Systems:  Review of Systems  Constitutional:  Negative for chills, fever and weight loss.  HENT:  Negative for tinnitus.   Respiratory:  Negative for cough, sputum production and shortness of breath.   Cardiovascular:  Negative for chest pain, palpitations and leg swelling.  Gastrointestinal:  Negative for abdominal pain, constipation, diarrhea and heartburn.  Genitourinary:  Negative for dysuria, frequency and urgency.  Musculoskeletal:  Negative for back pain, falls, joint pain and myalgias.  Skin: Negative.   Neurological:  Positive for dizziness. Negative for headaches.  Psychiatric/Behavioral:  Negative for depression and memory loss. The patient does not have insomnia.   ***  Past Medical History:  Diagnosis Date   BPH (benign prostatic hypertrophy)    CKD (chronic kidney disease)    ED (erectile dysfunction)    Essential hypertension, benign    Hypothyroidism    Right inguinal hernia    Past  Surgical History:  Procedure Laterality Date   APPENDECTOMY     COLONOSCOPY  2017   Per [facility] New Patient Packet    COLONOSCOPY  09/10/2020   Dr.Magod, awaiting biopsy results. 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 His smoking use included cigarettes. He started smoking about 61 years ago. He has a 21 pack-year smoking history. He has never used smokeless tobacco. He reports current alcohol use. He reports that he does not use drugs.  Family History  Problem Relation Age of Onset   Parkinson's disease Mother    Stroke Father    Diabetes Father    Heart disease Father    Diabetes Brother    Myasthenia gravis Brother    Diabetes Brother    High Cholesterol Brother    Neuropathy Brother     Medications: Patient's Medications  New Prescriptions   No medications on file  Previous Medications   AMLODIPINE  (NORVASC ) 10 MG TABLET    TAKE 1 TABLET BY MOUTH DAILY   CHOLECALCIFEROL (VITAMIN D ) 25 MCG (1000 UT) TABLET    Take 2,000 Units by mouth daily.    DICLOFENAC SODIUM (VOLTAREN) 1 % GEL    Apply 1 g topically as needed.   LEVOTHYROXINE  (SYNTHROID ) 175 MCG TABLET    TAKE 1 TABLET BY MOUTH DAILY BEFORE BREAKFAST   LOSARTAN  (COZAAR ) 100 MG TABLET    TAKE 1 TABLET BY MOUTH DAILY   LOVASTATIN  (MEVACOR ) 40 MG TABLET    TAKE 1 TABLET BY MOUTH AT BEDTIME   OMEGA-3 FATTY ACIDS (FISH OIL PO)    Take 2,000 mg by mouth daily.   TAMSULOSIN  (FLOMAX ) 0.4 MG CAPS CAPSULE    TAKE ONE CAPSULE BY MOUTH DAILY  Modified Medications   No medications on file  Discontinued Medications   No medications on file    Physical Exam:  Vitals:   05/28/24 1052  BP: 128/84  Pulse: 71  Resp: 12  Temp: 97.8 F (36.6 C)  SpO2: 93%  Weight: 242 lb 3.2 oz (109.9 kg)  Height: 6' (1.829 m)   Body mass index is 32.85 kg/m. Wt Readings from  Last 3 Encounters:  05/28/24 242 lb 3.2 oz (109.9 kg)  11/25/23 252 lb 6.4 oz (114.5 kg)  03/07/23 245 lb (111.1 kg)    Physical Exam***  Labs reviewed: Basic Metabolic Panel: Recent Labs    11/25/23 1147  NA 139  K 4.2  CL 102  CO2 28  GLUCOSE 117*  BUN 22  CREATININE 1.57*  CALCIUM 9.7  TSH 6.31*   Liver Function Tests: Recent Labs    11/25/23 1147  AST 29  ALT 28  BILITOT 0.9  PROT 7.2   No results for input(s): LIPASE, AMYLASE in the last 8760 hours. No results for input(s): AMMONIA in the last 8760 hours. CBC: Recent Labs    11/25/23 1147  WBC 7.4  NEUTROABS 4,344  HGB 15.3  HCT 44.6  MCV 89.7  PLT 279  Lipid Panel: Recent Labs    11/25/23 1147  CHOL 114  HDL 47  LDLCALC 50  TRIG 86  CHOLHDL 2.4   TSH: Recent Labs    11/25/23 1147  TSH 6.31*   A1C: Lab Results  Component Value Date   HGBA1C 6.6 (H) 11/25/2023     Assessment/Plan *** There are no diagnoses linked to this encounter.   No follow-ups on file.: ***  Ikran Patman K. Caro BODILY [facility] [phone]

## 2024-05-29 ENCOUNTER — Ambulatory Visit: Payer: Self-pay | Admitting: Nurse Practitioner

## 2024-05-29 LAB — COMPREHENSIVE METABOLIC PANEL WITH GFR
AG Ratio: 1.6 (calc) (ref 1.0–2.5)
ALT: 21 U/L (ref 9–46)
AST: 23 U/L (ref 10–35)
Albumin: 4.4 g/dL (ref 3.6–5.1)
Alkaline phosphatase (APISO): 66 U/L (ref 35–144)
BUN/Creatinine Ratio: 12 (calc) (ref 6–22)
BUN: 19 mg/dL (ref 7–25)
CO2: 29 mmol/L (ref 20–32)
Calcium: 9.3 mg/dL (ref 8.6–10.3)
Chloride: 102 mmol/L (ref 98–110)
Creat: 1.55 mg/dL — ABNORMAL HIGH (ref 0.70–1.22)
Globulin: 2.7 g/dL (ref 1.9–3.7)
Glucose, Bld: 112 mg/dL — ABNORMAL HIGH (ref 65–99)
Potassium: 4.2 mmol/L (ref 3.5–5.3)
Sodium: 139 mmol/L (ref 135–146)
Total Bilirubin: 0.9 mg/dL (ref 0.2–1.2)
Total Protein: 7.1 g/dL (ref 6.1–8.1)
eGFR: 45 mL/min/1.73m2 — ABNORMAL LOW (ref >=60)

## 2024-05-29 LAB — CBC WITH DIFFERENTIAL/PLATELET
Absolute Lymphocytes: 1897 {cells}/uL (ref 850–3900)
Absolute Monocytes: 748 {cells}/uL (ref 200–950)
Basophils Absolute: 102 {cells}/uL (ref 0–200)
Basophils Relative: 1.5 %
Eosinophils Absolute: 238 {cells}/uL (ref 15–500)
Eosinophils Relative: 3.5 %
HCT: 46.9 % (ref 38.5–50.0)
Hemoglobin: 15.2 g/dL (ref 13.2–17.1)
MCH: 29.9 pg (ref 27.0–33.0)
MCHC: 32.4 g/dL (ref 32.0–36.0)
MCV: 92.3 fL (ref 80.0–100.0)
MPV: 9.4 fL (ref 7.5–12.5)
Monocytes Relative: 11 %
Neutro Abs: 3815 {cells}/uL (ref 1500–7800)
Neutrophils Relative %: 56.1 %
Platelets: 262 Thousand/uL (ref 140–400)
RBC: 5.08 Million/uL (ref 4.20–5.80)
RDW: 12.8 % (ref 11.0–15.0)
Total Lymphocyte: 27.9 %
WBC: 6.8 Thousand/uL (ref 3.8–10.8)

## 2024-05-29 LAB — HEMOGLOBIN A1C
Hgb A1c MFr Bld: 6.4 % — ABNORMAL HIGH (ref <5.7)
Mean Plasma Glucose: 137 mg/dL
eAG (mmol/L): 7.6 mmol/L

## 2024-05-29 LAB — TSH: TSH: 5.05 m[IU]/L — ABNORMAL HIGH (ref 0.40–4.50)

## 2024-05-30 NOTE — Assessment & Plan Note (Signed)
 Chronic and stable Encourage proper hydration Follow metabolic panel Avoid nephrotoxic meds (NSAIDS)

## 2024-05-30 NOTE — Assessment & Plan Note (Signed)
 Stable, continues on flomax 

## 2024-05-30 NOTE — Assessment & Plan Note (Signed)
 Blood pressure well controlled, goal bp <140/90 Continue current medications and dietary modifications follow metabolic panel

## 2024-05-30 NOTE — Assessment & Plan Note (Signed)
 Continues on lovastatin  with diety and exercise modification

## 2024-05-30 NOTE — Assessment & Plan Note (Signed)
 Continues on synthroid  175 mcg, overall stable, slight elevation in tsh on last check

## 2024-05-31 DIAGNOSIS — K080 Exfoliation of teeth due to systemic causes: Secondary | ICD-10-CM | POA: Diagnosis not present

## 2024-06-13 DIAGNOSIS — R072 Precordial pain: Secondary | ICD-10-CM | POA: Insufficient documentation

## 2024-06-13 NOTE — Progress Notes (Unsigned)
 Cardiology Office Note   Date:  06/14/2024   ID:  Zeno, Hickel Apr 14, 1941, MRN [id number]  PCP:  Caro Harlene POUR, NP  Cardiologist:   None Referring:  Self  Chief Complaint  Patient presents with   Abnormal ECG      History of Present Illness: [name] is a 83 y.o. male who presents for who I saw in 2018 for SOB.  I sent him for a POET (Plain Old Exercise Treadmill).  This was negative for ischemia.   He was in the ED in March in 2025 with chest pain that was thought to be chest wall.  I reviewed these records for this visit.    He has had no new cardiovascular problems but given family history he wanted to be seen again.  He is active.  He has a recumbent bike he rides.  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  With this he denies any cardiovascular symptoms such as chest pressure, neck or arm discomfort.  He gets no new shortness of breath, PND or orthopnea.  He has no palpitations, presyncope or syncope.  He has had no weight gain or edema.  He has had a couple of episodes of dizziness over the past few years 1 at least associated with standing quickly and he thought he was dehydrated.  He did say that the chest discomfort in March she thought was musculoskeletal and it went away after a few days and he has not had recurrence of this.  He did related this to some workouts he was doing at the gym.   Past Medical History:  Diagnosis Date   BPH (benign prostatic hypertrophy)    CKD (chronic kidney disease)    ED (erectile dysfunction)    Essential hypertension, benign    Hypothyroidism    Right inguinal hernia     Past Surgical History:  Procedure Laterality Date   APPENDECTOMY     COLONOSCOPY  2017   Per [facility] New Patient Packet    COLONOSCOPY  09/10/2020   Dr.Magod, awaiting biopsy results. 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;  Service: Orthopedics;  Laterality: Left;   HERNIA REPAIR     abd   REPAIR EXTENSOR TENDON Left 09/07/2016   Procedure: Reconstruction PULLEY sheath left middle finger Extensor retinaculum graft;  Surgeon: Arley Curia, MD;  Location: [facility];  Service: Orthopedics;  Laterality: Left;     Current Outpatient Medications  Medication Sig Dispense Refill   amLODipine  (NORVASC ) 10 MG tablet TAKE 1 TABLET BY MOUTH DAILY 90 tablet 1   cholecalciferol (VITAMIN D ) 25 MCG (1000 UT) tablet Take 2,000 Units by mouth daily.      diclofenac Sodium (VOLTAREN) 1 % GEL Apply 1 g topically as needed.     levothyroxine  (SYNTHROID ) 175 MCG tablet TAKE 1 TABLET BY MOUTH DAILY BEFORE BREAKFAST 90 tablet 1   losartan  (COZAAR ) 100 MG tablet TAKE 1 TABLET BY MOUTH DAILY 90 tablet 1   lovastatin  (MEVACOR ) 40 MG tablet TAKE 1 TABLET BY MOUTH AT BEDTIME 90 tablet 1   Omega-3 Fatty Acids (FISH OIL PO) Take 2,000 mg by mouth daily.     tamsulosin  (FLOMAX ) 0.4 MG CAPS capsule TAKE ONE CAPSULE BY MOUTH DAILY 90 capsule 1   No current facility-administered medications for this visit.  Allergies:   Paregoric    Social History:  The patient  reports that he quit smoking about 31 years ago. His smoking use included cigarettes. He started smoking about 61 years ago. He has a 21 pack-year smoking history. He has never used smokeless tobacco. He reports current alcohol use. He reports that he does not use drugs.   Family History:  The patient's family history includes Diabetes in his brother, brother, and father; Heart disease (age of onset: 71) in his father; High Cholesterol in his brother; Myasthenia gravis in his brother; Neuropathy in his brother; Parkinson's disease in his mother; Stroke in his father.    ROS:  Please see the history of present illness.   Otherwise, review of systems are positive for none.   All other systems are reviewed and negative.    PHYSICAL EXAM: VS:   BP 118/76   Pulse 70   Ht 6' (1.829 m)   Wt 242 lb (109.8 kg)   SpO2 96%   BMI 32.82 kg/m  , BMI Body mass index is 32.82 kg/m. GENERAL:  Well appearing HEENT:  Pupils equal round and reactive, fundi not visualized, oral mucosa unremarkable NECK:  No jugular venous distention, waveform within normal limits, carotid upstroke brisk and symmetric, no bruits, no thyromegaly LYMPHATICS:  No cervical, inguinal adenopathy LUNGS:  Clear to auscultation bilaterally BACK:  No CVA tenderness CHEST:  Unremarkable HEART:  PMI not displaced or sustained,S1 and S2 within normal limits, no S3, no S4, no clicks, no rubs, no murmurs ABD:  Flat, positive bowel sounds normal in frequency in pitch, no bruits, no rebound, no guarding, no midline pulsatile mass, no hepatomegaly, no splenomegaly EXT:  2 plus pulses throughout, no edema, no cyanosis no clubbing SKIN:  No rashes no nodules NEURO:  Cranial nerves II through XII grossly intact, motor grossly intact throughout [facility]:  Cognitively intact, oriented to person place and time    EKG:  EKG Interpretation Date/Time:  Thursday June 14 2024 11:27:45 EDT Ventricular Rate:  70 PR Interval:  206 QRS Duration:  170 QT Interval:  442 QTC Calculation: 477 R Axis:   -85  Text Interpretation: Normal sinus rhythm Left axis deviation Right bundle branch block When compared with ECG of 2019  no change Confirmed by Lavona Agent ([id number]) on 06/14/2024 12:42:06 PM     Recent Labs: 05/28/2024: ALT 21; BUN 19; Creat 1.55; Hemoglobin 15.2; Platelets 262; Potassium 4.2; Sodium 139; TSH 5.05    Lipid Panel    Component Value Date/Time   CHOL 114 11/25/2023 1147   CHOL 105 07/04/2019 1137   CHOL 98 01/31/2013 1047   TRIG 86 11/25/2023 1147   TRIG 98 02/11/2017 1111   TRIG 83 01/31/2013 1047   HDL 47 11/25/2023 1147   HDL 40 07/04/2019 1137   HDL 39 (L) 02/11/2017 1111   HDL 37 (L) 01/31/2013 1047   CHOLHDL 2.4 11/25/2023 1147   LDLCALC 50 11/25/2023  1147   LDLCALC 52 02/04/2014 0854   LDLCALC 44 01/31/2013 1047      Wt Readings from Last 3 Encounters:  06/14/24 242 lb (109.8 kg)  05/28/24 242 lb 3.2 oz (109.9 kg)  11/25/23 252 lb 6.4 oz (114.5 kg)      Other studies Reviewed: Additional studies/ records that were reviewed today include: Labs. Review of the above records demonstrates:  Please see elsewhere in the note.     ASSESSMENT AND PLAN:   CHEST PAIN:    The patient had  some nonanginal chest pain in March.  He otherwise is very active and not having any chest discomfort.  I do not think that further testing is indicated but he does need aggressive risk reduction.  DYSLIPIDEMIA:   LDL was 50.  HDL was 47.  He will continue on statins as listed.   HTN:  The blood pressure is controlled on the meds as listed.  No change in therapy.    OVERWEIGHT: He is physically active.  We had a long discussion about diet.  He is gena work on reducing his carbohydrates.  RBBB: This is new since I saw him in 2017 but it was present in 2019 and he has no bradycardic arrhythmias or symptoms.  We discussed the physiology and he would let me know if he has symptoms in the future.  No change in therapy.  Current medicines are reviewed at length with the patient today.  The patient does not have concerns regarding medicines.  The following changes have been made:  no change  Labs/ tests ordered today include:   Orders Placed This Encounter  Procedures   EKG 12-Lead     Disposition:   FU with with me as needed.     Signed, Lynwood Schilling, MD  06/14/2024 12:45 PM    [facility]

## 2024-06-14 ENCOUNTER — Ambulatory Visit: Attending: Cardiology | Admitting: Cardiology

## 2024-06-14 ENCOUNTER — Encounter: Payer: Self-pay | Admitting: Cardiology

## 2024-06-14 VITALS — BP 118/76 | HR 70 | Ht 72.0 in | Wt 242.0 lb

## 2024-06-14 DIAGNOSIS — E785 Hyperlipidemia, unspecified: Secondary | ICD-10-CM | POA: Diagnosis not present

## 2024-06-14 DIAGNOSIS — I10 Essential (primary) hypertension: Secondary | ICD-10-CM

## 2024-06-14 DIAGNOSIS — R072 Precordial pain: Secondary | ICD-10-CM

## 2024-06-14 NOTE — Patient Instructions (Signed)

## 2024-06-26 ENCOUNTER — Other Ambulatory Visit: Payer: Self-pay | Admitting: *Deleted

## 2024-06-26 DIAGNOSIS — I10 Essential (primary) hypertension: Secondary | ICD-10-CM

## 2024-06-26 MED ORDER — AMLODIPINE BESYLATE 10 MG PO TABS: 10.0000 mg | ORAL_TABLET | Freq: Every day | ORAL | 1 refills | Status: DC

## 2024-06-26 NOTE — Telephone Encounter (Signed)
 [facility] requested refill.

## 2024-07-05 ENCOUNTER — Other Ambulatory Visit: Payer: Self-pay | Admitting: Nurse Practitioner

## 2024-07-05 DIAGNOSIS — E7800 Pure hypercholesterolemia, unspecified: Secondary | ICD-10-CM

## 2024-08-24 ENCOUNTER — Other Ambulatory Visit: Payer: Self-pay | Admitting: Nurse Practitioner

## 2024-08-24 DIAGNOSIS — E039 Hypothyroidism, unspecified: Secondary | ICD-10-CM

## 2024-09-17 ENCOUNTER — Other Ambulatory Visit: Payer: Self-pay | Admitting: Nurse Practitioner

## 2024-09-17 DIAGNOSIS — I10 Essential (primary) hypertension: Secondary | ICD-10-CM

## 2024-10-11 ENCOUNTER — Other Ambulatory Visit: Payer: Self-pay

## 2024-10-11 DIAGNOSIS — I10 Essential (primary) hypertension: Secondary | ICD-10-CM

## 2024-10-11 DIAGNOSIS — E039 Hypothyroidism, unspecified: Secondary | ICD-10-CM

## 2024-10-11 DIAGNOSIS — E7800 Pure hypercholesterolemia, unspecified: Secondary | ICD-10-CM

## 2024-10-11 MED ORDER — LOSARTAN POTASSIUM 100 MG PO TABS: 100.0000 mg | ORAL_TABLET | Freq: Every day | ORAL | 1 refills | Status: AC

## 2024-10-11 MED ORDER — AMLODIPINE BESYLATE 10 MG PO TABS: 10.0000 mg | ORAL_TABLET | Freq: Every day | ORAL | 1 refills | Status: AC

## 2024-10-11 MED ORDER — TAMSULOSIN HCL 0.4 MG PO CAPS: 0.4000 mg | ORAL_CAPSULE | Freq: Every day | ORAL | 1 refills | Status: AC

## 2024-10-11 MED ORDER — LOVASTATIN 40 MG PO TABS: 40.0000 mg | ORAL_TABLET | Freq: Every day | ORAL | 1 refills | Status: AC

## 2024-10-11 MED ORDER — LEVOTHYROXINE SODIUM 175 MCG PO TABS: 175.0000 ug | ORAL_TABLET | Freq: Every day | ORAL | 1 refills | Status: AC

## 2024-10-29 ENCOUNTER — Telehealth: Payer: Self-pay | Admitting: *Deleted

## 2024-10-29 NOTE — Telephone Encounter (Signed)
Patient wife notified and agreed.  

## 2024-10-29 NOTE — Telephone Encounter (Signed)
 Copied from CRM #[id number]. Topic: Clinical - Medication Question >> Oct 29, 2024  2:50 PM Debby BROCKS wrote: Reason for CRM: Patients wife Jerett Odonohue) called in stating that her husband has had a horrible cough since Friday (no discolored phlegm) and has recently picked up a temperature of 102. She would like to know if something can be prescribed for the husband instead of having to go in as they are 2 hours away from the office.  The best pharmacy for them is the [facility] at [address] [place], [place] [address]  Callback: [phone]

## 2024-10-29 NOTE — Telephone Encounter (Signed)
 Would recommend going to a local urgent care so they can test for flu or covid and do a full evaluation

## 2024-10-29 NOTE — Telephone Encounter (Signed)
 No Available Appointments Please Advise.

## 2024-11-08 ENCOUNTER — Encounter: Payer: Self-pay | Admitting: Nurse Practitioner

## 2024-11-08 ENCOUNTER — Ambulatory Visit: Payer: Medicare Other | Admitting: Nurse Practitioner

## 2024-11-08 DIAGNOSIS — Z0000 Encounter for general adult medical examination without abnormal findings: Secondary | ICD-10-CM | POA: Diagnosis not present

## 2024-11-08 NOTE — Patient Instructions (Signed)
 Mr. Kinzler,  Thank you for taking the time for your Medicare Wellness Visit. I appreciate your continued commitment to your health goals. Please review the care plan we discussed, and feel free to reach out if I can assist you further.  Please note that Annual Wellness Visits do not include a physical exam. Some assessments may be limited, especially if the visit was conducted virtually. If needed, we may recommend an in-person follow-up with your provider.  Ongoing Care Seeing your primary care provider every 3 to 6 months helps us  monitor your health and provide consistent, personalized care.   Referrals If a referral was made during today's visit and you haven't received any updates within two weeks, please contact the referred provider directly to check on the status.  Recommended Screenings:  Health Maintenance  Topic Date Due   Medicare Annual Wellness Visit  11/07/2024   COVID-19 Vaccine (8 - 2025-26 season) 03/11/2025   DTaP/Tdap/Td vaccine (3 - Td or Tdap) 12/06/2033   Pneumococcal Vaccine for age over 75  Completed   Flu Shot  Completed   Zoster (Shingles) Vaccine  Completed   Meningitis B Vaccine  Aged Out   Hepatitis B Vaccine  Discontinued   Colon Cancer Screening  Discontinued   Hepatitis C Screening  Discontinued       11/08/2024   10:38 AM  Advanced Directives  Does Patient Have a Medical Advance Directive? No  Would patient like information on creating a medical advance directive? No - Patient declined    Vision: Annual vision screenings are recommended for early detection of glaucoma, cataracts, and diabetic retinopathy. These exams can also reveal signs of chronic conditions such as diabetes and high blood pressure.  Dental: Annual dental screenings help detect early signs of oral cancer, gum disease, and other conditions linked to overall health, including heart disease and diabetes.  Please see the attached documents for additional preventive care  recommendations.

## 2024-11-08 NOTE — Progress Notes (Signed)
"  °  This service is provided via telemedicine  No vital signs collected/recorded due to the encounter was a telemedicine visit.   Location of patient (ex: home, work):  Home  Patient consents to a telephone visit:  Yes  Location of the provider (ex: office, home):  Office [place].   Name of any referring provider:  na  Names of all persons participating in the telemedicine service and their role in the encounter:  Lynwood Borrow, patient, Donzell Beal, CMA, Harlene An, NP  Time spent on call:  7:11  "

## 2024-11-08 NOTE — Progress Notes (Signed)
 "  Chief Complaint  Patient presents with   Medicare Wellness    AWV     Subjective:   [name] is a 84 y.o. male who presents for a Medicare Annual Wellness Visit.  Visit info / Clinical Intake: Medicare Wellness Visit Type:: Subsequent Annual Wellness Visit Persons participating in visit and providing information:: patient Medicare Wellness Visit Mode:: Video Since this visit was completed virtually, some vitals may be partially provided or unavailable. Missing vitals are due to the limitations of the virtual format.: Unable to obtain vitals - no equipment If Telephone or Video please confirm:: I connected with patient using audio/video enable telemedicine. I verified patient identity with two identifiers, discussed telehealth limitations, and patient agreed to proceed. Patient Location:: home Provider Location:: [facility] Interpreter Needed?: No Pre-visit prep was completed: yes AWV questionnaire completed by patient prior to visit?: yes Living arrangements:: lives with spouse/significant other Patient's Overall Health Status Rating: good Typical amount of pain: some Does pain affect daily life?: no Are you currently prescribed opioids?: no  Dietary Habits and Nutritional Risks How many meals a day?: 3 Eats fruit and vegetables daily?: yes Most meals are obtained by: preparing own meals In the last 2 weeks, have you had any of the following?: (!) nausea, vomiting, diarrhea (had the flu) Diabetic:: no  Functional Status Activities of Daily Living (to include ambulation/medication): Independent Ambulation: Independent Medication Administration: Independent Home Management (perform basic housework or laundry): Independent Manage your own finances?: yes Primary transportation is: driving Concerns about vision?: no *vision screening is required for WTM* Concerns about hearing?: no  Fall Screening Falls in the past year?: 0 Number of falls in past year: 0 Was  there an injury with Fall?: 0 Fall Risk Category Calculator: 0 Patient Fall Risk Level: Low Fall Risk  Fall Risk Patient at Risk for Falls Due to: No Fall Risks Fall risk Follow up: Falls evaluation completed  Home and Transportation Safety: All rugs have non-skid backing?: yes All stairs or steps have railings?: yes Grab bars in the bathtub or shower?: yes Have non-skid surface in bathtub or shower?: yes Good home lighting?: yes Regular seat belt use?: yes Hospital stays in the last year:: no  Cognitive Assessment Difficulty concentrating, remembering, or making decisions? : no Will 6CIT or Mini Cog be Completed: yes What year is it?: 0 points What month is it?: 0 points Give patient an address phrase to remember (5 components): [address] [facility] [address] [facility] [place]  About what time is it?: 0 points Count backwards from 20 to 1: 0 points Say the months of the year in reverse: 0 points Repeat the address phrase from earlier: 0 points 6 CIT Score: 0 points  Advance Directives (For Healthcare) Does Patient Have a Medical Advance Directive?: No Does patient want to make changes to medical advance directive?: No - Patient declined Type of Advance Directive: Out of facility DNR (pink MOST or yellow form) Out of facility DNR (pink MOST or yellow form) in Chart? (Ambulatory ONLY): No - copy requested Would patient like information on creating a medical advance directive?: No - Patient declined  Reviewed/Updated  Reviewed/Updated: Reviewed All (Medical, Surgical, Family, Medications, Allergies, Care Teams, Patient Goals)    Allergies (verified) Paregoric   Current Medications (verified) Outpatient Encounter Medications as of 11/08/2024  Medication Sig   amLODipine  (NORVASC ) 10 MG tablet Take 1 tablet (10 mg total) by mouth daily.   cholecalciferol (VITAMIN D ) 25 MCG (1000 UT) tablet Take 2,000 Units by  mouth daily.    diclofenac Sodium (VOLTAREN) 1 % GEL Apply 1 g topically as  needed.   levothyroxine  (SYNTHROID ) 175 MCG tablet Take 1 tablet (175 mcg total) by mouth daily before breakfast.   losartan  (COZAAR ) 100 MG tablet Take 1 tablet (100 mg total) by mouth daily.   lovastatin  (MEVACOR ) 40 MG tablet Take 1 tablet (40 mg total) by mouth at bedtime.   Omega-3 Fatty Acids (FISH OIL PO) Take 2,000 mg by mouth daily.   tamsulosin  (FLOMAX ) 0.4 MG CAPS capsule Take 1 capsule (0.4 mg total) by mouth daily.   No facility-administered encounter medications on file as of 11/08/2024.    History: Past Medical History:  Diagnosis Date   BPH (benign prostatic hypertrophy)    CKD (chronic kidney disease)    ED (erectile dysfunction)    Essential hypertension, benign    Hypothyroidism    Right inguinal hernia    Past Surgical History:  Procedure Laterality Date   APPENDECTOMY     COLONOSCOPY  2017   Per [facility] New Patient Packet    COLONOSCOPY  09/10/2020   Dr.Magod, awaiting biopsy results. 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   Procedure: APPLICATION DIGIT WIDGET LET MIDDLE FINGER;  Surgeon: Arley Curia, MD;  Location: [facility];  Service: Orthopedics;  Laterality: Left;   HERNIA REPAIR     abd   REPAIR EXTENSOR TENDON Left 09/07/2016   Procedure: Reconstruction PULLEY sheath left middle finger Extensor retinaculum graft;  Surgeon: Arley Curia, MD;  Location: [facility];  Service: Orthopedics;  Laterality: Left;   Family History  Problem Relation Age of Onset   Parkinson's disease Mother    Stroke Father    Diabetes Father    Heart disease Father 28   Diabetes Brother    Myasthenia gravis Brother    Diabetes Brother    High Cholesterol Brother    Neuropathy Brother    Social History   Occupational History   Occupation: Textiles  Tobacco Use   Smoking status: Former    Current packs/day: 0.00    Average packs/day: 0.7 packs/day for 30.0 years (21.0 ttl pk-yrs)    Types: Cigarettes    Start date:  10/25/1962    Quit date: 10/25/1992    Years since quitting: 32.0   Smokeless tobacco: Never  Vaping Use   Vaping status: Never Used  Substance and Sexual Activity   Alcohol use: Yes    Comment: Rare    Drug use: No   Sexual activity: Not on file   Tobacco Counseling Counseling given: Not Answered  SDOH Screenings   Food Insecurity: No Food Insecurity (11/05/2024)  Housing: Low Risk (11/05/2024)  Transportation Needs: No Transportation Needs (11/05/2024)  Utilities: Not At Risk (11/08/2024)  Alcohol Screen: Low Risk (05/25/2024)  Depression (PHQ2-9): Low Risk (11/08/2024)  Financial Resource Strain: Low Risk (11/05/2024)  Physical Activity: Insufficiently Active (11/05/2024)  Social Connections: Moderately Isolated (11/05/2024)  Stress: No Stress Concern Present (11/05/2024)  Tobacco Use: Medium Risk (11/08/2024)   See flowsheets for full screening details  Depression Screen PHQ 2 & 9 Depression Scale- Over the past 2 weeks, how often have you been bothered by any of the following problems? Little interest or pleasure in doing things: 0 Feeling down, depressed, or hopeless (PHQ Adolescent also includes...irritable): 0 PHQ-2 Total Score: 0     Goals Addressed   None          Objective:  There were no vitals filed for this visit. There is no height or weight on file to calculate BMI.  Hearing/Vision screen Vision Screening - Comments:: Prentice Ruder Last Exam: 11/2023 Immunizations and Health Maintenance Health Maintenance  Topic Date Due   Medicare Annual Wellness (AWV)  11/07/2024   COVID-19 Vaccine (8 - 2025-26 season) 03/11/2025   DTaP/Tdap/Td (3 - Td or Tdap) 12/06/2033   Pneumococcal Vaccine: 50+ Years  Completed   Influenza Vaccine  Completed   Zoster Vaccines- Shingrix   Completed   Meningococcal B Vaccine  Aged Out   Hepatitis B Vaccines 19-59 Average Risk  Discontinued   Colonoscopy  Discontinued   Hepatitis C Screening  Discontinued         Assessment/Plan:  This is a routine wellness examination for Afshin.  Patient Care Team: Caro Harlene POUR, NP as PCP - General (Geriatric Medicine) Rosalie Kitchens, MD as Consulting Physician (Gastroenterology) Lavona Lynwood, MD as Consulting Physician (Cardiology) Storm Prentice DASEN, MD as Referring Physician (Ophthalmology)  I have personally reviewed and noted the following in the patients chart:   Medical and social history Use of alcohol, tobacco or illicit drugs  Current medications and supplements including opioid prescriptions. Functional ability and status Nutritional status Physical activity Advanced directives List of other physicians Hospitalizations, surgeries, and ER visits in previous 12 months Vitals Screenings to include cognitive, depression, and falls Referrals and appointments  No orders of the defined types were placed in this encounter.  In addition, I have reviewed and discussed with patient certain preventive protocols, quality metrics, and best practice recommendations. A written personalized care plan for preventive services as well as general preventive health recommendations were provided to patient.   Harlene POUR Caro, NP   11/08/2024   No follow-ups on file.  After Visit Summary: (MyChart) Due to this being a telephonic visit, the after visit summary with patients personalized plan was offered to patient via MyChart   "

## 2024-11-29 ENCOUNTER — Other Ambulatory Visit: Payer: Self-pay | Admitting: Medical Genetics

## 2024-11-29 ENCOUNTER — Encounter: Payer: Self-pay | Admitting: Nurse Practitioner

## 2024-11-30 ENCOUNTER — Ambulatory Visit: Payer: Self-pay | Admitting: Nurse Practitioner

## 2024-11-30 ENCOUNTER — Encounter: Payer: Self-pay | Admitting: Nurse Practitioner

## 2024-11-30 VITALS — BP 130/76 | HR 78 | Temp 97.7°F | Ht 72.0 in | Wt 248.6 lb

## 2024-11-30 DIAGNOSIS — E7800 Pure hypercholesterolemia, unspecified: Secondary | ICD-10-CM

## 2024-11-30 DIAGNOSIS — I10 Essential (primary) hypertension: Secondary | ICD-10-CM

## 2024-11-30 DIAGNOSIS — M1712 Unilateral primary osteoarthritis, left knee: Secondary | ICD-10-CM

## 2024-11-30 DIAGNOSIS — N1832 Chronic kidney disease, stage 3b: Secondary | ICD-10-CM

## 2024-11-30 DIAGNOSIS — N401 Enlarged prostate with lower urinary tract symptoms: Secondary | ICD-10-CM

## 2024-11-30 DIAGNOSIS — R739 Hyperglycemia, unspecified: Secondary | ICD-10-CM

## 2024-11-30 DIAGNOSIS — E039 Hypothyroidism, unspecified: Secondary | ICD-10-CM

## 2024-11-30 LAB — LIPID PANEL
Cholesterol: 109 mg/dL
HDL: 44 mg/dL
LDL Cholesterol (Calc): 47 mg/dL
Non-HDL Cholesterol (Calc): 65 mg/dL
Total CHOL/HDL Ratio: 2.5 (calc)
Triglycerides: 97 mg/dL

## 2024-11-30 LAB — COMPREHENSIVE METABOLIC PANEL WITH GFR
AG Ratio: 1.8 (calc) (ref 1.0–2.5)
ALT: 26 U/L (ref 9–46)
AST: 20 U/L (ref 10–35)
Albumin: 4.6 g/dL (ref 3.6–5.1)
Alkaline phosphatase (APISO): 64 U/L (ref 35–144)
BUN/Creatinine Ratio: 15 (calc) (ref 6–22)
BUN: 25 mg/dL (ref 7–25)
CO2: 28 mmol/L (ref 20–32)
Calcium: 9.5 mg/dL (ref 8.6–10.3)
Chloride: 103 mmol/L (ref 98–110)
Creat: 1.62 mg/dL — ABNORMAL HIGH (ref 0.70–1.22)
Globulin: 2.5 g/dL (ref 1.9–3.7)
Glucose, Bld: 111 mg/dL — ABNORMAL HIGH (ref 65–99)
Potassium: 3.9 mmol/L (ref 3.5–5.3)
Sodium: 139 mmol/L (ref 135–146)
Total Bilirubin: 0.8 mg/dL (ref 0.2–1.2)
Total Protein: 7.1 g/dL (ref 6.1–8.1)
eGFR: 42 mL/min/{1.73_m2} — ABNORMAL LOW

## 2024-11-30 LAB — CBC WITH DIFFERENTIAL/PLATELET
Absolute Lymphocytes: 1991 {cells}/uL (ref 850–3900)
Absolute Monocytes: 813 {cells}/uL (ref 200–950)
Basophils Absolute: 114 {cells}/uL (ref 0–200)
Basophils Relative: 1.5 %
Eosinophils Absolute: 289 {cells}/uL (ref 15–500)
Eosinophils Relative: 3.8 %
HCT: 45.1 % (ref 39.4–51.1)
Hemoglobin: 15.1 g/dL (ref 13.2–17.1)
MCH: 30.4 pg (ref 27.0–33.0)
MCHC: 33.5 g/dL (ref 31.6–35.4)
MCV: 90.7 fL (ref 81.4–101.7)
MPV: 9.2 fL (ref 7.5–12.5)
Monocytes Relative: 10.7 %
Neutro Abs: 4393 {cells}/uL (ref 1500–7800)
Neutrophils Relative %: 57.8 %
Platelets: 261 10*3/uL (ref 140–400)
RBC: 4.97 Million/uL (ref 4.20–5.80)
RDW: 12.9 % (ref 11.0–15.0)
Total Lymphocyte: 26.2 %
WBC: 7.6 10*3/uL (ref 3.8–10.8)

## 2024-11-30 NOTE — Progress Notes (Unsigned)
 "   Careteam: Patient Care Team: Caro Harlene POUR, NP as PCP - General (Geriatric Medicine) Rosalie Kitchens, MD as Consulting Physician (Gastroenterology) Lavona Agent, MD as Consulting Physician (Cardiology) Storm Prentice DASEN, MD as Referring Physician (Ophthalmology)  PLACE OF SERVICE:  [facility]  Advanced Directive information    Allergies[1]  Chief Complaint  Patient presents with   Medication management of chronic issues    6 month follow up    HPI:  Discussed the use of AI scribe software for clinical note transcription with the patient, who gave verbal consent to proceed.  History of Present Illness [name] is an 84 year old male who presents for a six-month follow-up.  He is concerned about his diabetes management, noting poor adherence to dietary recommendations. His last hemoglobin A1c was 6.4% in August 2025, and he is due for a recheck today.  He is currently taking lovastatin  40 mg daily at bedtime for hyperlipidemia, prescribed due to the size of his cholesterol particles rather than the total amount. His cholesterol levels have generally been low.  His blood pressure is well controlled with losartan  100 mg daily. He also takes Synthroid  175 mcg daily for acquired hypothyroidism, and his TSH levels are due for re-evaluation.  For benign prostatic hyperplasia, he takes tamsulosin  0.4 mg daily. He reports stable urinary symptoms with no significant changes in frequency or flow, although he sometimes experiences difficulty emptying his bladder completely. He typically wakes up two to three times per night to urinate. He has not seen his urologist, Dr. Devere, for a couple of years but recalls a previous procedure to rule out cancer, which confirmed an enlarged prostate without malignancy.  He experiences knee pain, particularly when using stairs, which he attributes to arthritis. He previously consulted a specialist who indicated he was not yet a  candidate for knee replacement surgery. Despite the knee pain, he continues to play golf without significant issues.  He reports good sleep quality, stating he 'always sleeps good' until he needs to urinate. He had the flu in early December 2025, which lasted almost a month, but he has since recovered without residual cough or congestion. He denies any other abnormal aches or pains.  ***  Review of Systems:  Review of Systems  Constitutional:  Negative for chills, fever and weight loss.  HENT:  Negative for tinnitus.   Respiratory:  Negative for cough, sputum production and shortness of breath.   Cardiovascular:  Negative for chest pain, palpitations and leg swelling.  Gastrointestinal:  Negative for abdominal pain, constipation, diarrhea and heartburn.  Genitourinary:  Positive for frequency. Negative for dysuria and urgency.  Musculoskeletal:  Negative for back pain, falls, joint pain and myalgias.  Skin: Negative.   Neurological:  Negative for dizziness and headaches.  Psychiatric/Behavioral:  Negative for depression and memory loss. The patient does not have insomnia.     Past Medical History:  Diagnosis Date   BPH (benign prostatic hypertrophy)    CKD (chronic kidney disease)    ED (erectile dysfunction)    Essential hypertension, benign    Hypothyroidism    Right inguinal hernia    Past Surgical History:  Procedure Laterality Date   APPENDECTOMY     COLONOSCOPY  2017   Per [facility] New Patient Packet    COLONOSCOPY  09/10/2020   Dr.Magod, awaiting biopsy results. 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 His smoking use included cigarettes. He started smoking about 62 years ago. He has a 21 pack-year smoking history. He has never used smokeless tobacco. He reports current alcohol use. He reports that he does not use drugs.  Family History  Problem Relation Age of Onset   Parkinson's disease Mother    Stroke Father    Diabetes Father    Heart disease Father    Diabetes Brother    Myasthenia gravis Brother    Diabetes Brother    High Cholesterol Brother    Neuropathy Brother     Medications: Patient's Medications  New Prescriptions   No medications on file  Previous Medications   AMLODIPINE  (NORVASC ) 10 MG TABLET    Take 1 tablet (10 mg total) by mouth daily.   CHOLECALCIFEROL (VITAMIN D ) 25 MCG (1000 UT) TABLET    Take 2,000 Units by mouth daily.    DICLOFENAC SODIUM (VOLTAREN) 1 % GEL    Apply 1 g topically as needed.   LEVOTHYROXINE  (SYNTHROID ) 175 MCG TABLET    Take 1 tablet (175 mcg total) by mouth daily before breakfast.   LOSARTAN  (COZAAR ) 100 MG TABLET    Take 1 tablet (100 mg total) by mouth daily.   LOVASTATIN  (MEVACOR ) 40 MG TABLET    Take 1 tablet (40 mg total) by mouth at bedtime.   OMEGA-3 FATTY ACIDS (FISH OIL PO)    Take 2,000 mg by mouth daily.   TAMSULOSIN  (FLOMAX ) 0.4 MG CAPS CAPSULE    Take 1 capsule (0.4 mg total) by mouth daily.  Modified Medications   No medications on file  Discontinued Medications   No medications on file    Physical Exam:  Vitals:   11/30/24 1051  BP: 130/76  Pulse: 78  Temp: 97.7 F (36.5 C)  SpO2: 98%  Weight: 248 lb 9.6 oz (112.8 kg)  Height: 6' (1.829 m)   Body mass index is 33.72 kg/m. Wt Readings from Last 3 Encounters:  11/30/24 248 lb 9.6 oz (112.8 kg)  06/14/24 242 lb (109.8 kg)  05/28/24 242 lb 3.2 oz (109.9 kg)    Physical Exam Constitutional:      General: He is not in  acute distress.    Appearance: He is well-developed. He is not diaphoretic.  HENT:     Head: Normocephalic and atraumatic.     Mouth/Throat:     Pharynx: No oropharyngeal exudate.  Eyes:     Conjunctiva/sclera: Conjunctivae normal.     Pupils: Pupils are equal, round, and reactive to light.  Cardiovascular:     Rate and Rhythm: Normal rate and regular rhythm.     Heart sounds: Normal heart sounds.  Pulmonary:     Effort: Pulmonary effort is normal.     Breath sounds: Normal breath sounds.  Abdominal:     General: Bowel sounds are normal.     Palpations: Abdomen is soft.  Musculoskeletal:        General: No tenderness.     Cervical back: Normal range of motion and neck supple.  Skin:  General: Skin is warm and dry.  Neurological:     Mental Status: He is alert and oriented to person, place, and time.   ***  Labs reviewed: Basic Metabolic Panel: Recent Labs    05/28/24 1119  NA 139  K 4.2  CL 102  CO2 29  GLUCOSE 112*  BUN 19  CREATININE 1.55*  CALCIUM 9.3  TSH 5.05*   Liver Function Tests: Recent Labs    05/28/24 1119  AST 23  ALT 21  BILITOT 0.9  PROT 7.1   No results for input(s): LIPASE, AMYLASE in the last 8760 hours. No results for input(s): AMMONIA in the last 8760 hours. CBC: Recent Labs    05/28/24 1119  WBC 6.8  NEUTROABS 3,815  HGB 15.2  HCT 46.9  MCV 92.3  PLT 262   Lipid Panel: No results for input(s): CHOL, HDL, LDLCALC, TRIG, CHOLHDL, LDLDIRECT in the last 8760 hours. TSH: Recent Labs    05/28/24 1119  TSH 5.05*   A1C: Lab Results  Component Value Date   HGBA1C 6.4 (H) 05/28/2024     Assessment/Plan *** There are no diagnoses linked to this encounter.   No follow-ups on file.: ***  Lauryl Seyer K. Caro BODILY [facility] [phone]     [1]  Allergies Allergen Reactions   Paregoric    "

## 2025-05-31 ENCOUNTER — Ambulatory Visit: Admitting: Nurse Practitioner

## 2025-12-13 ENCOUNTER — Ambulatory Visit: Admitting: Nurse Practitioner
